# Patient Record
Sex: Female | Born: 1935 | Race: White | Hispanic: No | Smoking: Never smoker
Health system: Southern US, Community
[De-identification: ages and names within clinical notes are randomized; demographics above are authoritative.]

## PROBLEM LIST (undated history)

## (undated) DIAGNOSIS — R51 Headache: Secondary | ICD-10-CM

## (undated) DIAGNOSIS — R413 Other amnesia: Secondary | ICD-10-CM

## (undated) DIAGNOSIS — F0280 Dementia in other diseases classified elsewhere without behavioral disturbance: Secondary | ICD-10-CM

## (undated) DIAGNOSIS — H353220 Exudative age-related macular degeneration, left eye, stage unspecified: Secondary | ICD-10-CM

## (undated) DIAGNOSIS — W19XXXA Unspecified fall, initial encounter: Secondary | ICD-10-CM

## (undated) DIAGNOSIS — J302 Other seasonal allergic rhinitis: Secondary | ICD-10-CM

## (undated) DIAGNOSIS — S5290XA Unspecified fracture of unspecified forearm, initial encounter for closed fracture: Secondary | ICD-10-CM

## (undated) DIAGNOSIS — Z8489 Family history of other specified conditions: Secondary | ICD-10-CM

## (undated) DIAGNOSIS — K219 Gastro-esophageal reflux disease without esophagitis: Secondary | ICD-10-CM

## (undated) DIAGNOSIS — K449 Diaphragmatic hernia without obstruction or gangrene: Secondary | ICD-10-CM

## (undated) DIAGNOSIS — R519 Headache, unspecified: Secondary | ICD-10-CM

## (undated) DIAGNOSIS — G309 Alzheimer's disease, unspecified: Secondary | ICD-10-CM

## (undated) DIAGNOSIS — J189 Pneumonia, unspecified organism: Secondary | ICD-10-CM

## (undated) DIAGNOSIS — K5790 Diverticulosis of intestine, part unspecified, without perforation or abscess without bleeding: Secondary | ICD-10-CM

## (undated) HISTORY — PX: BLADDER SUSPENSION: SHX72

## (undated) HISTORY — PX: DILATION AND CURETTAGE OF UTERUS: SHX78

## (undated) HISTORY — PX: CATARACT EXTRACTION W/ INTRAOCULAR LENS IMPLANT: SHX1309

## (undated) HISTORY — PX: INGUINAL HERNIA REPAIR: SUR1180

---

## 2001-03-12 ENCOUNTER — Other Ambulatory Visit: Admission: RE | Admit: 2001-03-12 | Discharge: 2001-03-12 | Payer: Self-pay | Admitting: Internal Medicine

## 2001-04-19 ENCOUNTER — Encounter: Payer: Self-pay | Admitting: Internal Medicine

## 2001-04-19 ENCOUNTER — Ambulatory Visit (HOSPITAL_COMMUNITY): Admission: RE | Admit: 2001-04-19 | Discharge: 2001-04-19 | Payer: Self-pay | Admitting: Internal Medicine

## 2002-12-11 ENCOUNTER — Ambulatory Visit (HOSPITAL_COMMUNITY): Admission: RE | Admit: 2002-12-11 | Discharge: 2002-12-11 | Payer: Self-pay | Admitting: Internal Medicine

## 2002-12-11 ENCOUNTER — Encounter: Payer: Self-pay | Admitting: Internal Medicine

## 2003-01-10 ENCOUNTER — Encounter: Payer: Self-pay | Admitting: General Surgery

## 2003-01-10 ENCOUNTER — Ambulatory Visit (HOSPITAL_COMMUNITY): Admission: RE | Admit: 2003-01-10 | Discharge: 2003-01-10 | Payer: Self-pay | Admitting: General Surgery

## 2003-07-14 ENCOUNTER — Encounter: Payer: Self-pay | Admitting: Internal Medicine

## 2003-07-14 ENCOUNTER — Encounter: Admission: RE | Admit: 2003-07-14 | Discharge: 2003-07-14 | Payer: Self-pay | Admitting: Internal Medicine

## 2004-08-09 ENCOUNTER — Ambulatory Visit (HOSPITAL_COMMUNITY): Admission: RE | Admit: 2004-08-09 | Discharge: 2004-08-09 | Payer: Self-pay | Admitting: Internal Medicine

## 2005-09-27 ENCOUNTER — Ambulatory Visit (HOSPITAL_COMMUNITY): Admission: RE | Admit: 2005-09-27 | Discharge: 2005-09-27 | Payer: Self-pay | Admitting: Internal Medicine

## 2007-02-13 ENCOUNTER — Ambulatory Visit (HOSPITAL_COMMUNITY): Admission: RE | Admit: 2007-02-13 | Discharge: 2007-02-13 | Payer: Self-pay | Admitting: Internal Medicine

## 2008-03-04 ENCOUNTER — Ambulatory Visit (HOSPITAL_COMMUNITY): Admission: RE | Admit: 2008-03-04 | Discharge: 2008-03-04 | Payer: Self-pay | Admitting: Internal Medicine

## 2009-02-19 ENCOUNTER — Emergency Department (HOSPITAL_COMMUNITY): Admission: EM | Admit: 2009-02-19 | Discharge: 2009-02-19 | Payer: Self-pay | Admitting: Emergency Medicine

## 2009-02-23 ENCOUNTER — Emergency Department (HOSPITAL_COMMUNITY): Admission: EM | Admit: 2009-02-23 | Discharge: 2009-02-23 | Payer: Self-pay | Admitting: Emergency Medicine

## 2011-02-04 NOTE — Op Note (Signed)
NAME:  Tami Harris, Tami Harris                        ACCOUNT NO.:  [id number]   MEDICAL RECORD NO.:  [id number]                   PATIENT TYPE:  AMB   LOCATION:  DAY                                  FACILITY:  [facility]   PHYSICIAN:  Adolph Pollack, M.D.            DATE OF BIRTH:  02/08/1924   DATE OF PROCEDURE:  01/10/2003  DATE OF DISCHARGE:                                 OPERATIVE REPORT   PREOPERATIVE DIAGNOSIS:  Left inguinal hernia.   POSTOPERATIVE DIAGNOSIS:  Direct left inguinal hernia.   PROCEDURE:  Repair of direct left inguinal hernia with mesh.   SURGEON:  Adolph Pollack, M.D.   ANESTHESIA:  General plus 0.5% Marcaine for local block.   INDICATIONS FOR PROCEDURE:  Ms. Mohamud is a 77 year old female whose had  intermittent groin pains in the left side radiating down to her thigh and  she noticed a bulge that is reducible. On examination, indeed she has a left  inguinal bulge which is reducible and it is above the inguinal ligament  consistent with an inguinal hernia. She has had two previous bladder  suspensions through lower transverse incisions. She now presents for  elective hernia repair.   TECHNIQUE:  She is brought to the operating room, the left thigh was marked.  She was placed supine on the operating table and she was given a general  anesthetic. The groin area was shaved. The lower abdomen and groin were then  sterilely prepped and draped. 0.5% plain Marcaine was infiltrated in the  left groin both superficially and deep and an incision made in the left  groin and carried down through Scarpa's fascia to the external oblique  aponeurosis. The bleeding was controlled with cautery. More local anesthetic  was infiltrated deep to the external oblique aponeurosis and then it was  incised and split medially and then laterally exposing the underlying  internal oblique muscle and aponeurosis. Using blunt dissection, I exposed  this more and I noticed that there  was a suture likely from the previous  bladder suspension surgery and I cut this to allow for better exposure of  the internal oblique aponeurosis.   I identified the ilioinguinal nerve and injected some local anesthetic  around it. I then retracted it inferiorly. I then used blunt dissection to  identify the shelving edge of the inguinal ligament. I noted more scarring  and sutures from previous closure of bladder suspension operation and I went  ahead and incised this to allow for adequate exposure medially by the pubic  tubercle. I identified the round ligament, isolated it and excised part of  it. There was an obvious direct hernia defect with extraperitoneal fat  protruding from it. I loosely approximated the attenuating transversalis  fascia over the protruding fat.   I saw no evidence of an indirect defect. I subsequently brought a piece of 3  x 6 inch mesh into the field and anchored  it approximately 1-2 cm medial to  the pubic tubercle with 2-0 Prolene suture. I then anchored the inferior  aspect of the mesh to the shelving edge of the inguinal ligament with a  running 2-0 Prolene suture well lateral to the internal ring. The superior  aspect of the mesh was then anchored to the internal oblique muscle and  aponeurosis with interrupted 2-0 Vicryl sutures. The lateral aspect of the  mesh was then tucked deep to the external oblique aponeurosis.   Hemostasis was adequate at this time. The external oblique aponeurosis was  then closed over the mesh with a running 3-0 Vicryl suture. Scarpa's fascia  was closed with a running 3-0 Vicryl sutures. The skin was closed with a 4-0  Monocryl subcuticular stitch. Steri-Strips and a sterile dressing were  applied.   She tolerated the procedure well without any apparent complications and was  taken to the recovery room in satisfactory condition. The plan is to let her  go ahead and be discharged home in the care of her family.   DISCHARGE  INSTRUCTIONS:  A prescription for pain medicine had been given to  her.                                               Adolph Pollack, M.D.    Kari Baars  D:  01/10/2003  T:  01/10/2003  Job:  [id number]   cc:   Olene Craven, M.D.  [address]  [address]  [place]  [place] [address]  Fax: [phone]

## 2011-06-15 ENCOUNTER — Encounter (HOSPITAL_COMMUNITY): Payer: Self-pay

## 2011-06-15 ENCOUNTER — Emergency Department (HOSPITAL_COMMUNITY): Payer: Medicare Other

## 2011-06-15 ENCOUNTER — Inpatient Hospital Stay (HOSPITAL_COMMUNITY)
Admission: EM | Admit: 2011-06-15 | Discharge: 2011-06-17 | DRG: 690 | Disposition: A | Discharge status: Home Health | Payer: Medicare Other | Attending: Internal Medicine | Admitting: Internal Medicine

## 2011-06-15 ENCOUNTER — Observation Stay (HOSPITAL_COMMUNITY): Payer: Medicare Other

## 2011-06-15 DIAGNOSIS — K449 Diaphragmatic hernia without obstruction or gangrene: Secondary | ICD-10-CM | POA: Diagnosis present

## 2011-06-15 DIAGNOSIS — Z79899 Other long term (current) drug therapy: Secondary | ICD-10-CM

## 2011-06-15 DIAGNOSIS — G309 Alzheimer's disease, unspecified: Secondary | ICD-10-CM | POA: Diagnosis present

## 2011-06-15 DIAGNOSIS — F0280 Dementia in other diseases classified elsewhere without behavioral disturbance: Secondary | ICD-10-CM | POA: Diagnosis present

## 2011-06-15 DIAGNOSIS — N390 Urinary tract infection, site not specified: Principal | ICD-10-CM | POA: Diagnosis present

## 2011-06-15 DIAGNOSIS — E860 Dehydration: Secondary | ICD-10-CM | POA: Diagnosis present

## 2011-06-15 HISTORY — DX: Other amnesia: R41.3

## 2011-06-15 LAB — URINALYSIS, ROUTINE W REFLEX MICROSCOPIC
Glucose, UA: NEGATIVE mg/dL
Ketones, ur: NEGATIVE mg/dL
Protein, ur: NEGATIVE mg/dL

## 2011-06-15 LAB — URINE MICROSCOPIC-ADD ON

## 2011-06-15 LAB — CBC
MCH: 31.9 pg (ref 26.0–34.0)
Platelets: 210 10*3/uL (ref 150–400)
RBC: 4.29 MIL/uL (ref 3.87–5.11)
RDW: 13.7 % (ref 11.5–15.5)
WBC: 13.8 10*3/uL — ABNORMAL HIGH (ref 4.0–10.5)

## 2011-06-15 LAB — CK TOTAL AND CKMB (NOT AT ARMC)
CK, MB: 2.9 ng/mL (ref 0.3–4.0)
Total CK: 61 U/L (ref 7–177)

## 2011-06-15 LAB — BASIC METABOLIC PANEL
CO2: 26 mEq/L (ref 19–32)
Calcium: 9.7 mg/dL (ref 8.4–10.5)
Chloride: 105 mEq/L (ref 96–112)
GFR calc Af Amer: >60 mL/min (ref >60)
Sodium: 142 mEq/L (ref 135–145)

## 2011-06-15 LAB — DIFFERENTIAL
Basophils Relative: 0 % (ref 0–1)
Eosinophils Absolute: 0.2 10*3/uL (ref 0.0–0.7)
Neutrophils Relative %: 81 % — ABNORMAL HIGH (ref 43–77)

## 2011-06-15 LAB — MRSA PCR SCREENING: MRSA by PCR: NEGATIVE

## 2011-06-15 LAB — D-DIMER, QUANTITATIVE: D-Dimer, Quant: 2.58 ug/mL-FEU — ABNORMAL HIGH (ref 0.00–0.48)

## 2011-06-15 LAB — CARDIAC PANEL(CRET KIN+CKTOT+MB+TROPI)
CK, MB: 2.6 ng/mL (ref 0.3–4.0)
Total CK: 41 U/L (ref 7–177)
Troponin I: <0.3 ng/mL (ref <0.30)

## 2011-06-16 DIAGNOSIS — M79609 Pain in unspecified limb: Secondary | ICD-10-CM

## 2011-06-16 LAB — CARDIAC PANEL(CRET KIN+CKTOT+MB+TROPI)
CK, MB: 2.7 ng/mL (ref 0.3–4.0)
Relative Index: INVALID (ref 0.0–2.5)
Relative Index: INVALID (ref 0.0–2.5)
Relative Index: INVALID (ref 0.0–2.5)
Total CK: 41 U/L (ref 7–177)
Total CK: 43 U/L (ref 7–177)
Total CK: 47 U/L (ref 7–177)
Troponin I: <0.3 ng/mL (ref <0.30)
Troponin I: <0.3 ng/mL (ref <0.30)

## 2011-06-16 MED ORDER — IOHEXOL 300 MG/ML  SOLN
100.0000 mL | Freq: Once | INTRAMUSCULAR | Status: AC | PRN
  Administered 2011-06-16: 100 mL via INTRAVENOUS

## 2011-06-17 DIAGNOSIS — I369 Nonrheumatic tricuspid valve disorder, unspecified: Secondary | ICD-10-CM

## 2011-06-17 LAB — URINE CULTURE
Colony Count: >=100000
Culture  Setup Time: 201209261354

## 2011-06-17 LAB — CARDIAC PANEL(CRET KIN+CKTOT+MB+TROPI)
Relative Index: INVALID (ref 0.0–2.5)
Relative Index: INVALID (ref 0.0–2.5)
Total CK: 49 U/L (ref 7–177)
Troponin I: <0.3 ng/mL (ref <0.30)
Troponin I: <0.3 ng/mL (ref <0.30)

## 2011-06-27 NOTE — Discharge Summary (Signed)
Tami Harris, Tami Harris              ACCOUNT NO.:  [id number]  MEDICAL RECORD NO.:  [id number]  LOCATION:  4707                         FACILITY:  [facility]  PHYSICIAN:  Richarda Overlie, MD       DATE OF BIRTH:  23-Jan-1924  DATE OF ADMISSION:  06/15/2011 DATE OF DISCHARGE:  06/17/2011                              DISCHARGE SUMMARY   PRIMARY CARE PHYSICIAN:  Olene Craven, MD.  DISCHARGE DIAGNOSES: 1. Syncope likely secondary to dehydration, urinary tract infection. 2. Urinary tract infection. 3. Elevated D-dimer ruled out for deep venous thrombosis and pulmonary     embolism. 4. Dementia.  PROCEDURES: 1. CT angio of the chest without contrast shows no evidence of PE,     mild left lingular and left lower lobe atelectasis and scarring     with mild associated bronchiectasis and accessory azygos lobe     noted, large hiatal hernia noted. 2. CT of the head without contrast shows no evidence of acute     intracranial abnormality, atrophy with mild small vessel ischemic     changes. 3. Chest x-ray shows a large hiatal hernia.  No active disease.  DISCHARGE MEDICATIONS: 1. Ciprofloxacin 500 mg p.o. twice daily for 5 days. 2. Temazepam 15 mg p.o. daily at bedtime. 3. Calcium carbonate 1 tablet p.o. 3 times a day. 4. Aricept 10 mg p.o. daily. 5. Fish oil 1 g 2 capsules p.o. daily. 6. Loratadine 10 mg p.o. daily as needed. 7. Lutein 20 mg p.o. daily. 8. Namenda 10 mg p.o. twice daily. 9. Vitamin A 1 tablet p.o. daily. 10.Vitamin D3 2000 one capsule p.o. daily.  SUBJECTIVE:  This is an 77 year old female resident of [place] who presented to the ER after an episode of syncope with mild shaking and drooling and she was noted to be diaphoretic and incontinent of urine.  The patient upon arrival was completely asymptomatic, alert, but somewhat confused and had no recollection of the event.  She was found to be hemodynamically stable upon presentation.  Initial blood  work showed her troponin to be negative.  Her EKG was consistent with a normal sinus rhythm without any acute ST-T segment changes.  A D-dimer was elevated at 2.58.  rest of her lab work including a WBC count showed an elevated WBC count of 13.8.  The patient was admitted for further evaluation.  HOSPITAL COURSE: 1. Syncope.  The patient had cardiac enzymes cycled which were found     to be negative.  She was placed on telemetry which was also found     to be negative and the patient was in normal sinus rhythm.  Her D-     dimer was elevated and the patient had a CT angio of the chest and     Doppler of bilateral lower extremity that did not show any evidence     of DVT or PE.  To complete the work the patient had a 2-D echo.  2-     D echo that showed an EF of 55-60% and grade 1 diastolic     dysfunction.  The patient also has Doppler ultrasound ordered,     which is pending  at this time.  The patient did not have any gross     focal neurologic deficits to suggest a CVA or a TIA.  She was     evaluated by PT, OT and was deemed to be appropriate for home     health services and home health R.N. will be arranged for. 2. Urinary tract infection.  The patient was started on treatment with     ciprofloxacin which she will continue for another 5 days.  Physical examination prior to discharge, the patient had orthostatics done which were found to be negative.  Her  blood pressure was 124/69, respirations 20, pulse 65, temperature 98.5, and 95% on room air.  At this time, the patient will be discharged to [facility] with home health RN services.  She will follow up with PCP in 5-7 days.     Richarda Overlie, MD     NA/MEDQ  D:  06/17/2011  T:  06/17/2011  Job:  [id number]  Electronically Signed by Richarda Overlie MD on 06/27/2011 02:21:06 PM

## 2011-07-17 NOTE — H&P (Signed)
Tami Harris, Tami Harris NO.:  [id number]  MEDICAL RECORD NO.:  [id number]  LOCATION:  4707                         FACILITY:  [facility]  PHYSICIAN:  Jonny Ruiz, MD    DATE OF BIRTH:  1924/05/13  DATE OF ADMISSION:  06/15/2011 DATE OF DISCHARGE:                             HISTORY & PHYSICAL   CHIEF COMPLAINT:  Passed out.  HISTORY OF PRESENT ILLNESS:  [name] with no significant past medical history, who resides at [facility] While she was having her hair done, she was sitting and slumped over. She was seen with mild shaking tremors, drooling, and diaphoretic.  It was also noted that she was incontinent of urine.  Subsequently, EMS brought the patient to the emergency department for further evaluation. Upon arrival, the patient was asymptomatic, alert but disoriented, was not able to make recollection of events.  No headaches or drowsiness. Denied any pain, shortness of breath, palpitations.  Denied headaches. No focal neurological deficits.  Prior to this event, the patient was doing well and had no complaints whatsoever.  PAST MEDICAL HISTORY:  Alzheimer's dementia.  MEDICATIONS: 1. Ambien 5 mg at bedtime. 2. Aricept 10 mg a day. 3. Claritin D 12-hour. 4. Namenda oral 10 mg b.i.d.  ALLERGIES:  SULFA.  SOCIAL HISTORY:  No tobacco, alcohol, or illicit.  Again resides at [facility].  REVIEW OF SYSTEMS:  Unable to obtain from the patient.  PHYSICAL EXAMINATION:  VITAL SIGNS:  Blood pressure 143/73, pulse 62, respirations 18, temperature 98.4, O2 sat 96% on room air. GENERAL APPEARANCE:  [name], who appears comfortable.  She is alert but disoriented.  She is pleasant and cooperative. HEENT:  Head is atraumatic.  PERRLA, EOMI.  Nose, clear nostrils.  Oral cavity, dry oral mucosa.  Tongue midline.  No nasolabial droops. NECK:  Supple.  No lymphadenopathy.  No carotid  bruits. HEART:  Regular S1 and S2 without gallops, murmurs, or rubs. LUNGS:  Clear to auscultation bilaterally.  No rales, rhonchi, or wheezes. ABDOMEN:  Nondistended, soft, nontender with normal bowel sounds.  No organomegaly or masses palpable. EXTREMITIES:  No clubbing, cyanosis, or edema. NEUROLOGIC:  Cranial nerves II through XII intact.  Motor strength is intact in the upper and lower extremities.  Sensation is normal.  Finger- to-nose difficult, but able to perform.  Heel-to-shin normal.  No Babinski.  No pronator drift. SKIN:  Normal.  No rashes.  No petechia.  Warm and dry.  LABORATORY DATA:  Sodium 142, potassium 3.6, chloride 105, carbon dioxide 26, glucose 108, BUN 15, creatinine 0.76, calcium 9.7.  WBC is 13.8, hemoglobin 13.7, hematocrit 40.3, MCV 93.9, platelet count 210, neutrophils 81%, granulocyte absolute 11.3, lymphocyte 8.  Total CK 61, CK-MB 2.9.  Troponin negative.  Head CT, no evidence of acute intracranial abnormality.  Atrophy with mild small vessel ischemic changes.  Chest x-ray, large hiatal hernia.  No active disease.  UA, nitrite positive, leukocyte esterase moderate.  Microscopy, bacteria many, wbc's 7-10.  EKG, normal sinus rhythm with no ST or T-wave abnormalities, rate 56 bpm.  D-dimer 2.58.  IMPRESSION: 1. Syncope. 2. Positive D-dimer. 3. Urinary tract  infection. 4. Large hiatal hernia. 5. Alzheimer's dementia.  PLAN: 1. Admit the patient to telemetry.  Obtain CT PA or CT angiogram of     the pulmonary arteries to rule out PE. 2. Obtain urine culture and sensitivity.  Start the patient on Cipro     400 IV q.12, omeprazole 40 mg a day.  Further orders pending     results of CT PA.          ______________________________ Jonny Ruiz, MD     GL/MEDQ  D:  06/15/2011  T:  06/15/2011  Job:  [id number]  Electronically Signed by Jonny Ruiz MD on 07/17/2011 09:31:49 PM

## 2011-09-20 DIAGNOSIS — J189 Pneumonia, unspecified organism: Secondary | ICD-10-CM

## 2011-09-20 HISTORY — DX: Pneumonia, unspecified organism: J18.9

## 2011-10-18 ENCOUNTER — Observation Stay (HOSPITAL_COMMUNITY)
Admission: EM | Admit: 2011-10-18 | Discharge: 2011-10-19 | Payer: Medicare Other | Attending: Internal Medicine | Admitting: Internal Medicine

## 2011-10-18 ENCOUNTER — Encounter (HOSPITAL_COMMUNITY): Payer: Self-pay | Admitting: Emergency Medicine

## 2011-10-18 ENCOUNTER — Emergency Department (HOSPITAL_COMMUNITY): Payer: Medicare Other

## 2011-10-18 DIAGNOSIS — G4700 Insomnia, unspecified: Secondary | ICD-10-CM | POA: Insufficient documentation

## 2011-10-18 DIAGNOSIS — R5381 Other malaise: Secondary | ICD-10-CM | POA: Insufficient documentation

## 2011-10-18 DIAGNOSIS — Z79899 Other long term (current) drug therapy: Secondary | ICD-10-CM | POA: Insufficient documentation

## 2011-10-18 DIAGNOSIS — R509 Fever, unspecified: Secondary | ICD-10-CM | POA: Insufficient documentation

## 2011-10-18 DIAGNOSIS — F0390 Unspecified dementia without behavioral disturbance: Secondary | ICD-10-CM | POA: Insufficient documentation

## 2011-10-18 DIAGNOSIS — J189 Pneumonia, unspecified organism: Principal | ICD-10-CM | POA: Diagnosis present

## 2011-10-18 DIAGNOSIS — D72829 Elevated white blood cell count, unspecified: Secondary | ICD-10-CM | POA: Insufficient documentation

## 2011-10-18 HISTORY — DX: Diverticulosis of intestine, part unspecified, without perforation or abscess without bleeding: K57.90

## 2011-10-18 LAB — DIFFERENTIAL
Lymphs Abs: 2 10*3/uL (ref 0.7–4.0)
Monocytes Relative: 11 % (ref 3–12)
Neutro Abs: 10.3 10*3/uL — ABNORMAL HIGH (ref 1.7–7.7)
Neutrophils Relative %: 74 % (ref 43–77)

## 2011-10-18 LAB — BASIC METABOLIC PANEL
BUN: 16 mg/dL (ref 6–23)
CO2: 27 mEq/L (ref 19–32)
Chloride: 105 mEq/L (ref 96–112)
Glucose, Bld: 96 mg/dL (ref 70–99)
Potassium: 3.9 mEq/L (ref 3.5–5.1)

## 2011-10-18 LAB — CBC
Hemoglobin: 13.4 g/dL (ref 12.0–15.0)
MCH: 30.6 pg (ref 26.0–34.0)
RBC: 4.38 MIL/uL (ref 3.87–5.11)

## 2011-10-18 MED ORDER — SPIRITUS FRUMENTI
1.0000 | Freq: Three times a day (TID) | ORAL | Status: DC
  Filled 2011-10-18: qty 1

## 2011-10-18 MED ORDER — ZOLPIDEM TARTRATE 5 MG PO TABS: 5.0000 mg | ORAL_TABLET | Freq: Every evening | ORAL | Status: DC | PRN

## 2011-10-18 MED ORDER — SODIUM CHLORIDE 0.9 % IJ SOLN
3.0000 mL | Freq: Two times a day (BID) | INTRAMUSCULAR | Status: DC
  Administered 2011-10-18 – 2011-10-19 (×2): 3 mL via INTRAVENOUS

## 2011-10-18 MED ORDER — LORATADINE 10 MG PO TABS
10.0000 mg | ORAL_TABLET | Freq: Every day | ORAL | Status: DC | PRN
  Filled 2011-10-18: qty 1

## 2011-10-18 MED ORDER — LEVOFLOXACIN IN D5W 250 MG/50ML IV SOLN
250.0000 mg | INTRAVENOUS | Status: DC
  Filled 2011-10-18: qty 50

## 2011-10-18 MED ORDER — LEVOFLOXACIN IN D5W 500 MG/100ML IV SOLN
500.0000 mg | INTRAVENOUS | Status: DC
  Filled 2011-10-18: qty 100

## 2011-10-18 MED ORDER — DEXTROSE 5 % IV SOLN
1.0000 g | Freq: Once | INTRAVENOUS | Status: AC
  Administered 2011-10-18: 1 g via INTRAVENOUS
  Filled 2011-10-18: qty 10

## 2011-10-18 MED ORDER — MEMANTINE HCL 10 MG PO TABS
10.0000 mg | ORAL_TABLET | Freq: Two times a day (BID) | ORAL | Status: DC
  Administered 2011-10-18 – 2011-10-19 (×2): 10 mg via ORAL
  Filled 2011-10-18 (×5): qty 1

## 2011-10-18 MED ORDER — LEVOFLOXACIN IN D5W 500 MG/100ML IV SOLN
500.0000 mg | Freq: Once | INTRAVENOUS | Status: AC
  Administered 2011-10-18: 500 mg via INTRAVENOUS
  Filled 2011-10-18: qty 100

## 2011-10-18 MED ORDER — TEMAZEPAM 15 MG PO CAPS: 15.0000 mg | ORAL_CAPSULE | Freq: Every evening | ORAL | Status: DC | PRN

## 2011-10-18 MED ORDER — SODIUM CHLORIDE 0.9 % IV SOLN: 250.0000 mL | INTRAVENOUS | Status: DC | PRN

## 2011-10-18 MED ORDER — DONEPEZIL HCL 10 MG PO TABS
10.0000 mg | ORAL_TABLET | Freq: Every day | ORAL | Status: DC
  Administered 2011-10-18: 10 mg via ORAL
  Filled 2011-10-18 (×3): qty 1

## 2011-10-18 MED ORDER — DEXTROSE 5 % IV SOLN
500.0000 mg | Freq: Once | INTRAVENOUS | Status: AC
  Administered 2011-10-18: 500 mg via INTRAVENOUS
  Filled 2011-10-18 (×2): qty 500

## 2011-10-18 MED ORDER — SPIRITUS FRUMENTI: 1.0000 | Freq: Three times a day (TID) | ORAL | Status: DC

## 2011-10-18 MED ORDER — SODIUM CHLORIDE 0.9 % IJ SOLN: 3.0000 mL | INTRAMUSCULAR | Status: DC | PRN

## 2011-10-18 NOTE — ED Provider Notes (Addendum)
History     CSN: [id number]  Arrival date & time 10/18/11  1218   First MD Initiated Contact with Patient 10/18/11 1252      Chief Complaint  Patient presents with  . Fever    99.2 at nursing home    (Consider location/radiation/quality/duration/timing/severity/associated sxs/prior treatment) The history is provided by a relative and the nursing home. The history is limited by the condition of the patient.  pt sent from NH because of cough and change behavior.  Level 5 for dementia.  Past Medical History  Diagnosis Date  . Allergic rhinitis   . Memory loss   . Dementia     Past Surgical History  Procedure Date  . Inguinal hernia repair     No family history on file.  History  Substance Use Topics  . Smoking status: Not on file  . Smokeless tobacco: Not on file  . Alcohol Use:     OB History    Grav Para Term Preterm Abortions TAB SAB Ect Mult Living                  Review of Systems  Unable to perform ROS level 5 for dementia  Allergies  Sulfa antibiotics  Home Medications   Current Outpatient Rx  Name Route Sig Dispense Refill  . CALCIUM CARBONATE ANTACID 500 MG PO CHEW Oral Chew 1 tablet by mouth daily.    Marland Kitchen VITAMIN D 1000 UNITS PO TABS Oral Take 1,000 Units by mouth daily.    . DONEPEZIL HCL 10 MG PO TABS Oral Take 10 mg by mouth at bedtime as needed.    Marland Kitchen LORATADINE 10 MG PO TABS Oral Take 10 mg by mouth daily as needed. ALLERGIES    . MEMANTINE HCL 10 MG PO TABS Oral Take 10 mg by mouth 2 (two) times daily.    . ADULT MULTIVITAMIN W/MINERALS CH Oral Take 1 tablet by mouth daily.    . OCUVITE-LUTEIN PO CAPS Oral Take 1 capsule by mouth daily.    . OMEGA-3-ACID ETHYL ESTERS 1 G PO CAPS Oral Take 2 g by mouth 2 (two) times daily.    Marland Kitchen TEMAZEPAM 15 MG PO CAPS Oral Take 15 mg by mouth at bedtime as needed.      BP 152/70  Pulse 76  Temp(Src) 99.8 F (37.7 C) (Oral)  Resp 16  SpO2 95%  Physical Exam  Vitals reviewed. Constitutional: She  appears well-developed and well-nourished.  HENT:  Head: Normocephalic and atraumatic.  Eyes: Pupils are equal, round, and reactive to light.  Neck: Normal range of motion. Neck supple.  Cardiovascular: Normal rate.   No murmur heard. Pulmonary/Chest: Effort normal. She has wheezes.       + rhonchi  Abdominal: Soft. There is no tenderness.  Neurological: She is alert.  Skin: Skin is warm and dry.    ED Course  Procedures (including critical care time)  Labs Reviewed  CBC - Abnormal; Notable for the following:    WBC 14.0 (*)    All other components within normal limits  DIFFERENTIAL - Abnormal; Notable for the following:    Neutro Abs 10.3 (*)    Monocytes Absolute 1.5 (*)    All other components within normal limits  BASIC METABOLIC PANEL - Abnormal; Notable for the following:    GFR calc non Af Amer 64 (*)    GFR calc Af Amer 75 (*)    All other components within normal limits  URINALYSIS, ROUTINE W REFLEX  MICROSCOPIC   Dg Chest Port 1 View  10/18/2011  *RADIOLOGY REPORT*  Clinical Data: Bedridden.  Nausea.  Vomiting, confused.  PORTABLE CHEST - 1 VIEW  Comparison: 06/16/2011 chest CT.  Findings: Moderate sized hiatal hernia is present.  Azygos fissure incidentally noted.  There is a faint area of density in the right upper lobe suspicious for pneumonia.  Lung volumes are lower than on prior exam.  Cardiopericardial silhouette and mediastinal contours are unchanged.  Follow-up to ensure radiographic clearing recommended.  Clearing is usually observed at 4-6 weeks.  IMPRESSION: 1. Faint patchy opacity in the right upper lobe is suspicious for pneumonia. 2.  Low lung volumes. 3.  Moderate sized hiatal hernia.  Original Report Authenticated By: Andreas Newport, M.D.     No diagnosis found.  2:58 PM Spoke with [facility] md. He will admit for tx of cap  CAP  MDM  CAP Leukocytosis dementia        Nicholes Stairs, MD 10/18/11 1458  Nicholes Stairs, MD 11/11/11  [phone]

## 2011-10-18 NOTE — ED Notes (Signed)
Pt is from [facility]. Has dementia.  Denies pain.

## 2011-10-18 NOTE — H&P (Signed)
History and Physical  LATISSA FRICK ZOX:[id number] DOB: January 04, 1924 DOA: 10/18/2011  Referring physician: Shela Commons. Nino Parsley, MD PCP: Ginette Otto, MD, MD   Chief Complaint: Lethargy  HPI:  78 year old woman presented to the emergency department from the nursing home. The patient has dementia and is not a reliable historian. History is obtained from her daughter is at the bedside. She reports that her mother has had a cough for a few days. Today her mother would not get out of bed which is quite unusual and she was noted by her roommate to be more lethargic. She seemed to be less responsive and perhaps more confused and therefore was sent to the emergency department for further evaluation. The patient herself has no complaints. Her daughter notes that her mother is sleepier than usual.  Workup in the emergency department was notable for leukocytosis and right upper lobe pneumonia. Because of her advanced age observation was recommended in the hospitalist service was consulted.  Review of Systems:  Negative for fever, changes to vision, sore throat, rash, muscle aches, chest pain, shortness of breath, dysuria, bleeding, nausea, vomiting, abdominal pain, diarrhea.  Past Medical History  Diagnosis Date  . Allergic rhinitis   . Memory loss   . Dementia    Past Surgical History  Procedure Date  . Inguinal hernia repair    Social History:  does not have a smoking history on file. She does not have any smokeless tobacco history on file. Her alcohol and drug histories not on file.  Allergies  Allergen Reactions  . Sulfa Antibiotics Other (See Comments)    unknown   Family History  Problem Relation Age of Onset  . Dementia Mother   . Parkinsonism Brother    Prior to Admission medications   Medication Sig Start Date End Date Taking? Authorizing Provider  calcium carbonate (TUMS - DOSED IN MG ELEMENTAL CALCIUM) 500 MG chewable tablet Chew 1 tablet by mouth daily.   Yes Historical  Provider, MD  cholecalciferol (VITAMIN D) 1000 UNITS tablet Take 1,000 Units by mouth daily.   Yes Historical Provider, MD  donepezil (ARICEPT) 10 MG tablet Take 10 mg by mouth at bedtime as needed.   Yes Historical Provider, MD  loratadine (CLARITIN) 10 MG tablet Take 10 mg by mouth daily as needed. ALLERGIES   Yes Historical Provider, MD  memantine (NAMENDA) 10 MG tablet Take 10 mg by mouth 2 (two) times daily.   Yes Historical Provider, MD  Multiple Vitamin (MULITIVITAMIN WITH MINERALS) TABS Take 1 tablet by mouth daily.   Yes Historical Provider, MD  multivitamin-lutein (OCUVITE-LUTEIN) CAPS Take 1 capsule by mouth daily.   Yes Historical Provider, MD  omega-3 acid ethyl esters (LOVAZA) 1 G capsule Take 2 g by mouth 2 (two) times daily.   Yes Historical Provider, MD  temazepam (RESTORIL) 15 MG capsule Take 15 mg by mouth at bedtime as needed.   Yes Historical Provider, MD   Physical Exam: Filed Vitals:   10/18/11 1222 10/18/11 1230  BP: 147/65 152/70  Pulse: 69 76  Temp: 99.1 F (37.3 C) 99.8 F (37.7 C)  TempSrc: Oral Oral  Resp: 16   SpO2: 95% 95%     General:  Appears calm and comfortable. Sleeping but easily arouses to voice.  Eyes: Pupils equal, round, reactive to light. Normal lids, irises, conjunctiva.  ENT: Hard of hearing. Lips and tongue appear unremarkable.  Neck: No lymphadenopathy or masses. No thyromegaly.  Cardiovascular: Regular rate and rhythm. No murmur, rub, gallop. No  lower extremity edema.  Respiratory: Coarse breath sounds with some rhonchi and wheezes noted. Normal respiratory effort.  Abdomen: Soft, nontender, nondistended.  Skin: Grossly unremarkable. No rash, induration, lesions seen.  Musculoskeletal: Appears grossly unremarkable.  Psychiatric: Alert, calm, pleasant.  Neurologic: Grossly unremarkable.  Labs on Admission:  Basic Metabolic Panel:  Lab 10/18/11 0960  NA 142  K 3.9  CL 105  CO2 27  GLUCOSE 96  BUN 16  CREATININE 0.81    CALCIUM 9.6  MG --  PHOS --   CBC:  Lab 10/18/11 1235  WBC 14.0*  NEUTROABS 10.3*  HGB 13.4  HCT 39.5  MCV 90.2  PLT 237   Radiological Exams on Admission: Dg Chest Port 1 View  10/18/2011  *RADIOLOGY REPORT*  Clinical Data: Bedridden.  Nausea.  Vomiting, confused.  PORTABLE CHEST - 1 VIEW  Comparison: 06/16/2011 chest CT.  Findings: Moderate sized hiatal hernia is present.  Azygos fissure incidentally noted.  There is a faint area of density in the right upper lobe suspicious for pneumonia.  Lung volumes are lower than on prior exam.  Cardiopericardial silhouette and mediastinal contours are unchanged.  Follow-up to ensure radiographic clearing recommended.  Clearing is usually observed at 4-6 weeks.  IMPRESSION: 1. Faint patchy opacity in the right upper lobe is suspicious for pneumonia. 2.  Low lung volumes. 3.  Moderate sized hiatal hernia.  Original Report Authenticated By: Andreas Newport, M.D.    Assessment/Plan 1. Right upper lobe pneumonia: Admitted medical floor. IV antibiotics. She is neither hypoxic nor tachypneic. She is afebrile and vital signs are stable. Therefore will treat as a community-acquired pneumonia even though she is from assisted living facility, as I doubt multidrug resistant organisms or MRSA. 2. Dementia: Appears stable. Continue Aricept and Namenda.  I discussed my clinical impression and plan with the patient's daughter at the bedside. She is agreeable to antibiotics but wants no aggressive measures and no further testing if possible. I do not see a compelling reason to repeat blood work in the morning given these wishes. Daughter is hopeful for discharge back to assisted living facility January 30.  Code Status: DO NOT RESUSCITATE Family Communication: Healthcare power of attorney/daughter Cipriano Mile [phone]. Disposition Plan: Return to assisted living facility January 30 anticipated.  Brendia Sacks, MD  [facility] Pager  [phone] 10/18/2011, 2:47 PM

## 2011-10-18 NOTE — Progress Notes (Signed)
ANTIBIOTIC CONSULT NOTE - INITIAL  Pharmacy Consult for Levaquin Indication: Community Acquired Pneumonia  Allergies  Allergen Reactions  . Sulfa Antibiotics Other (See Comments)    unknown    Patient Measurements: Height: 5\' 6"  (167.6 cm) Weight: 151 lb 9.6 oz (68.765 kg) IBW/kg (Calculated) : 59.3     Vital Signs: Temp: 98.1 F (36.7 C) (01/29 1700) Temp src: Oral (01/29 1700) BP: 157/73 mmHg (01/29 1700) Pulse Rate: 67  (01/29 1612) Intake/Output from previous day:   Intake/Output from this shift:    Labs:  Basename 10/18/11 1235  WBC 14.0*  HGB 13.4  PLT 237  LABCREA --  CREATININE 0.81   Estimated Creatinine Clearance: 47.5 ml/min (by C-G formula based on Cr of 0.81). No results found for this basename: VANCOTROUGH:2,VANCOPEAK:2,VANCORANDOM:2,GENTTROUGH:2,GENTPEAK:2,GENTRANDOM:2,TOBRATROUGH:2,TOBRAPEAK:2,TOBRARND:2,AMIKACINPEAK:2,AMIKACINTROU:2,AMIKACIN:2, in the last 72 hours   Microbiology: No results found for this or any previous visit (from the past 720 hour(s)).  Medical History: Past Medical History  Diagnosis Date  . Allergic rhinitis   . Memory loss   . Dementia   . Diverticulosis     Medications:  Scheduled:    . azithromycin  500 mg Intravenous Once  . cefTRIAXone (ROCEPHIN)  IV  1 g Intravenous Once  . donepezil  10 mg Oral QHS  . levofloxacin (LEVAQUIN) IV  500 mg Intravenous Q24H  . memantine  10 mg Oral BID  . sodium chloride  3 mL Intravenous Q12H  . DISCONTD: spiritus frumenti  1 each Oral TID WC  . DISCONTD: spiritus frumenti  1 each Oral TID WC   Infusions:   Assessment: Pharmacy asked to adjust antibiotics for renal function.  78 year old female with Scr = 0.81; however CrCl = 47 ml/min.  IV Levaquin to begin for right upper lobe pneumonia.  Pt is a resident of a nursing home; however provider notes patient is afebrile and vitals are stable and so will treat this PNA as community acquired.  Plan:  1.  Levaquin 500mg  IV x 1  then Levaquin 250mg  IV q24h 2.  Follow renal function and adjust dose if renal function should either improve or worsen.  Sherwood Castilla, Joselyn Glassman 10/18/2011,5:58 PM

## 2011-10-18 NOTE — ED Notes (Signed)
Pt's daughter Cipriano Mile: [phone]

## 2011-10-18 NOTE — ED Notes (Signed)
WUJ:[id number]<JY> Expected date:10/18/11<BR> Expected time:12:10 PM<BR> Means of arrival:Ambulance<BR> Comments:<BR> EMS 110 GC, 87 yof fever and FTT

## 2011-10-19 MED ORDER — LEVOFLOXACIN 750 MG PO TABS: 750.0000 mg | ORAL_TABLET | Freq: Every day | ORAL | Status: AC

## 2011-10-19 MED ORDER — DONEPEZIL HCL 10 MG PO TABS: 10.0000 mg | ORAL_TABLET | Freq: Every day | ORAL | Status: DC

## 2011-10-19 MED ORDER — GUAIFENESIN ER 600 MG PO TB12: 600.0000 mg | ORAL_TABLET | Freq: Two times a day (BID) | ORAL | Status: AC

## 2011-10-19 MED ORDER — LEVOFLOXACIN IN D5W 750 MG/150ML IV SOLN: 750.0000 mg | INTRAVENOUS | Status: DC

## 2011-10-19 NOTE — Progress Notes (Signed)
Patient discharged to ALF, all discharge medications and instructions reviewed with Daughter/POA Annice Pih and questions answered. Patient to be transported to ALF by daughter.

## 2011-10-19 NOTE — Progress Notes (Signed)
Patient cleared for discharge. Patient cleared to return to [facility]. CSW spoke with Anette Riedel at [facility]. CSW faxed completed FL-2 and discharge summary to [facility]. Packet copied and placed in [place]. CSW spoke with patients daughter, she is agreeable to transfer and she will transport the patient by care. Psychosocial assessment completed and placed in shadow chart.  Saveah Bahar C. Dealva Lafoy MSW, LCSW [phone]

## 2011-10-19 NOTE — Discharge Summary (Signed)
Physician Discharge Summary  Patient ID: HANNE KEGG MRN: [id number] DOB/AGE: Jan 18, 1924 78 y.o.  Admit date: November 12, 2011 Discharge date: 10/19/2011  Primary Care Physician:  Ginette Otto, MD, MD   Discharge Diagnoses:   1-Pneumonia 2-Leukocytosis 3-Fever 4-Dementia 5-Insomnia   Medication List  As of 10/19/2011  2:55 PM   TAKE these medications         calcium carbonate 500 MG chewable tablet   Commonly known as: TUMS - dosed in mg elemental calcium   Chew 1 tablet by mouth daily.      cholecalciferol 1000 UNITS tablet   Commonly known as: VITAMIN D   Take 1,000 Units by mouth daily.      donepezil 10 MG tablet   Commonly known as: ARICEPT   Take 10 mg by mouth at bedtime as needed.      guaiFENesin 600 MG 12 hr tablet   Commonly known as: MUCINEX   Take 1 tablet (600 mg total) by mouth 2 (two) times daily.      levofloxacin 750 MG tablet   Commonly known as: LEVAQUIN   Take 1 tablet (750 mg total) by mouth daily.      loratadine 10 MG tablet   Commonly known as: CLARITIN   Take 10 mg by mouth daily as needed. ALLERGIES      memantine 10 MG tablet   Commonly known as: NAMENDA   Take 10 mg by mouth 2 (two) times daily.      mulitivitamin with minerals Tabs   Take 1 tablet by mouth daily.      multivitamin-lutein Caps   Take 1 capsule by mouth daily.      omega-3 acid ethyl esters 1 G capsule   Commonly known as: LOVAZA   Take 2 g by mouth 2 (two) times daily.      temazepam 15 MG capsule   Commonly known as: RESTORIL   Take 15 mg by mouth at bedtime as needed.             Disposition and Follow-up:  Discharge in stable and improved condition back to ALF. Patient will finish Levaquin 750 mg for 7 more days to finish antibiotics treatment and will follow the rest of discharge instructions and also prescribed medications. Patient will follow with PCP in 7-10 days for follow up.  Consults:   None   Significant Diagnostic Studies:  Dg  Chest Port 1 View  2011/11/12  *RADIOLOGY REPORT*  Clinical Data: Bedridden.  Nausea.  Vomiting, confused.  PORTABLE CHEST - 1 VIEW  Comparison: 06/16/2011 chest CT.  Findings: Moderate sized hiatal hernia is present.  Azygos fissure incidentally noted.  There is a faint area of density in the right upper lobe suspicious for pneumonia.  Lung volumes are lower than on prior exam.  Cardiopericardial silhouette and mediastinal contours are unchanged.  Follow-up to ensure radiographic clearing recommended.  Clearing is usually observed at 4-6 weeks.  IMPRESSION: 1. Faint patchy opacity in the right upper lobe is suspicious for pneumonia. 2.  Low lung volumes. 3.  Moderate sized hiatal hernia.  Original Report Authenticated By: Andreas Newport, M.D.    Brief H and P: 78 y/o female with PMH significant for dementia; came into the hospital due to cough, fever and worsening confusion. Found on CXR to have Pneumonia; she was also found with leukocytosis and increase lethargy.   Hospital Course:  1-Pneumonia:Afebrile and with good vital signs now. Will d/c on Levaquin PO daily for 7 more days  and also mucinex to control her cough. Patient instructed to drink plenty of fluids and keep herself hydrated. She will return to ALF and will follow with PCP in 7-10 days.  2-Leukocytosis: 2/2 #1; continue antibiotics.  3-Fever: Also due to #1; continue antibiotics and tylenol as needed for symptoms control.  4-Dementia: Continue supportive care, namenda and aricept.  5-Insomnia:Continue PRN zolpidem.   Time spent on Discharge: 40 minutes.  Signed: Arieonna Medine 10/19/2011, 2:55 PM

## 2011-10-19 NOTE — Progress Notes (Signed)
ANTIBIOTIC CONSULT NOTE - FOLLOW UP  Pharmacy Consult for Levaquin Indication: HCAP  Allergies  Allergen Reactions  . Sulfa Antibiotics Other (See Comments)    unknown    Patient Measurements: Height: 5\' 6"  (167.6 cm) Weight: 151 lb 9.6 oz (68.765 kg) IBW/kg (Calculated) : 59.3    Vital Signs: Temp: 98.3 F (36.8 C) (01/30 1610) Temp src: Oral (01/30 9604) BP: 133/73 mmHg (01/30 5409) Pulse Rate: 61  (01/30 0632) Intake/Output from previous day:   Intake/Output from this shift: Total I/O In: 243 [P.O.:240; I.V.:3] Out: -   Labs:  Basename 10/18/11 1235  WBC 14.0*  HGB 13.4  PLT 237  LABCREA --  CREATININE 0.81   Estimated Creatinine Clearance: 47.5 ml/min (by C-G formula based on Cr of 0.81).    Microbiology: Sputum cx ordered, not collected yet  Anti-infectives     Start     Dose/Rate Route Frequency Ordered Stop   10/20/11 1600   Levofloxacin (LEVAQUIN) IVPB 750 mg        750 mg 100 mL/hr over 90 Minutes Intravenous Every 48 hours 10/19/11 1029     10/19/11 1800   Levofloxacin (LEVAQUIN) IVPB 250 mg  Status:  Discontinued        250 mg 50 mL/hr over 60 Minutes Intravenous Every 24 hours 10/18/11 1824 10/19/11 1029   10/18/11 1830   levofloxacin (LEVAQUIN) IVPB 500 mg  Status:  Discontinued        500 mg 100 mL/hr over 60 Minutes Intravenous Every 24 hours 10/18/11 1752 10/18/11 1806   10/18/11 1830   levofloxacin (LEVAQUIN) IVPB 500 mg        500 mg 100 mL/hr over 60 Minutes Intravenous  Once 10/18/11 1807 10/18/11 2117   10/18/11 1800   Levofloxacin (LEVAQUIN) IVPB 250 mg  Status:  Discontinued        250 mg 50 mL/hr over 60 Minutes Intravenous Every 24 hours 10/18/11 1807 10/18/11 1824   10/18/11 1430   cefTRIAXone (ROCEPHIN) 1 g in dextrose 5 % 50 mL IVPB        1 g 100 mL/hr over 30 Minutes Intravenous  Once 10/18/11 1426 10/18/11 1502   10/18/11 1430   azithromycin (ZITHROMAX) 500 mg in dextrose 5 % 250 mL IVPB        500 mg 250 mL/hr  over 60 Minutes Intravenous  Once 10/18/11 1426 10/18/11 1602          Assessment: 87 YOF, SNF resident w/ HCAP. Day #2 Levaquin 250 mg IV q24h after 1 x 500mg  1/29. Dose was based on CAP, will adjust to HCAP dosing since from SNF, increased WBC and reported Tm 99.2 @ SNF and Tm 99.8 here.  Goal of Therapy:  Appropriate dose of Levaquin  Plan:  Increase dose to 750mg  IV q48h. Will schedule next dose for 1/31 AM. Follow Scr/cx. Adjust dose as appropriate.  Gwen Her PharmD  [phone] 10/19/2011 10:34 AM

## 2014-12-15 ENCOUNTER — Ambulatory Visit (INDEPENDENT_AMBULATORY_CARE_PROVIDER_SITE_OTHER): Payer: Medicare Other

## 2014-12-15 ENCOUNTER — Ambulatory Visit (INDEPENDENT_AMBULATORY_CARE_PROVIDER_SITE_OTHER): Payer: Medicare Other | Admitting: Family Medicine

## 2014-12-15 VITALS — BP 114/68 | HR 71 | Temp 98.3°F | Resp 16 | Ht 65.0 in | Wt 146.0 lb

## 2014-12-15 DIAGNOSIS — M25562 Pain in left knee: Secondary | ICD-10-CM

## 2014-12-15 NOTE — Patient Instructions (Signed)
Your x-ray was negative. The fact that the pain is largely controlled with Tylenol is reassuring.  I think it's a good plan to night to take the Tylenol every 6 hours as needed. This is already included in your orders  I will refer you to orthopedics as soon as they can see you.

## 2014-12-15 NOTE — Progress Notes (Signed)
Subjective:  This chart was scribed for [name], MD by Modena JanskyAlbert Thayil, ED Scribe. This patient was seen in room 10 and the patient's care was started at 9:00 PM.   Patient ID: Tami Harris, female    DOB: [birth year], 81 y.o.   MRN: [id number] Chief Complaint  Patient presents with   Leg Pain    left   HPI HPI Comments: Tami Harris is a 81 y.o. female who presents to the [facility] complaining of constant moderate LLE pain that started about 2 days ago.   She states that she started having constant moderate LLE pain that started about 2 days ago. She states that she is unaware of any injury. She states that the pain located right above her left ankle has been worsening. She reports that bearing weight and ambulating for long periods exacerbates the pain.   She reports that she has been having intermittent muscle spasms in the affect area.   Daughter states that pt has a hx of mild Alzheimer's and bilateral broken heels.   Patient Active Problem List   Diagnosis Date Noted   Pneumonia 10/18/2011   Dementia 10/18/2011   Past Medical History  Diagnosis Date   Allergic rhinitis    Memory loss    Dementia    Diverticulosis    Past Surgical History  Procedure Laterality Date   Inguinal hernia repair     Allergies  Allergen Reactions   Sulfa Antibiotics Other (See Comments)    unknown   Prior to Admission medications   Medication Sig Start Date End Date Taking? Authorizing Provider  calcium carbonate (TUMS - DOSED IN MG ELEMENTAL CALCIUM) 500 MG chewable tablet Chew 1 tablet by mouth daily.   Yes Historical Provider, MD  cholecalciferol (VITAMIN D) 1000 UNITS tablet Take 1,000 Units by mouth daily.   Yes Historical Provider, MD  donepezil (ARICEPT) 10 MG tablet Take 1 tablet (10 mg total) by mouth at bedtime. 10/19/11  Yes Vassie Lollarlos Madera, MD  loratadine (CLARITIN) 10 MG tablet Take 10 mg by mouth daily as needed. ALLERGIES   Yes  Historical Provider, MD  memantine (NAMENDA) 10 MG tablet Take 10 mg by mouth 2 (two) times daily.   Yes Historical Provider, MD  Multiple Vitamin (MULITIVITAMIN WITH MINERALS) TABS Take 1 tablet by mouth daily.   Yes Historical Provider, MD  multivitamin-lutein (OCUVITE-LUTEIN) CAPS Take 1 capsule by mouth daily.   Yes Historical Provider, MD  omega-3 acid ethyl esters (LOVAZA) 1 G capsule Take 2 g by mouth 2 (two) times daily.   Yes Historical Provider, MD  temazepam (RESTORIL) 15 MG capsule Take 15 mg by mouth at bedtime as needed.   Yes Historical Provider, MD  donepezil (ARICEPT) 10 MG tablet Take 1 tablet (10 mg total) by mouth at bedtime. 10/19/11 10/18/12  Vassie Lollarlos Madera, MD   History   Social History   Marital Status: Widowed    Spouse Name: N/A   Number of Children: N/A   Years of Education: N/A   Occupational History   Not on file.   Social History Main Topics   Smoking status: Never Smoker    Smokeless tobacco: Never Used   Alcohol Use: No   Drug Use: No   Sexual Activity: No   Other Topics Concern   Not on file   Social History Narrative    Review of Systems  Musculoskeletal: Positive for myalgias.       Objective:  Physical Exam Elderly woman in no acute distress company by her daughter Inspection of the left lower extremity shows no swelling, no redness, no bony abnormality. Patient's able to ambulate in the room without problem Patient has good distal pulse in her posterior tibial artery  [facility] reading (PRIMARY) by  Dr. Milus Glazier:  Left tib/fib. Is negative    BP 114/68 mmHg   Pulse 71   Temp(Src) 98.3 F (36.8 C) (Oral)   Resp 16   Ht  (1.651 m)   Wt 146 lb (66.225 kg)   BMI 24.30 kg/m2   SpO2 95%    Assessment & Plan:    It's difficult to figure out what is causing this pain. It may be an early stress fracture or maybe shinsplints. I see nothing to suggest a serious problem and the fact that it is controlled with Tylenol is very  reassuring.  This chart was scribed in my presence and reviewed by me personally.    ICD-9-CM ICD-10-CM   1. Pain in joint, lower leg, left 719.46 M25.562 DG Tibia/Fibula Left     Ambulatory referral to Orthopedic Surgery     Ambulatory referral to Orthopedic Surgery     Signed, Elvina Sidle, MD

## 2015-12-17 ENCOUNTER — Encounter: Payer: Self-pay | Admitting: Neurology

## 2015-12-17 ENCOUNTER — Ambulatory Visit (INDEPENDENT_AMBULATORY_CARE_PROVIDER_SITE_OTHER): Payer: Medicare Other | Admitting: Neurology

## 2015-12-17 VITALS — BP 120/68 | HR 68 | Ht 65.0 in | Wt 143.3 lb

## 2015-12-17 DIAGNOSIS — G309 Alzheimer's disease, unspecified: Secondary | ICD-10-CM | POA: Diagnosis not present

## 2015-12-17 DIAGNOSIS — G5732 Lesion of lateral popliteal nerve, left lower limb: Secondary | ICD-10-CM | POA: Diagnosis not present

## 2015-12-17 DIAGNOSIS — F0280 Dementia in other diseases classified elsewhere without behavioral disturbance: Secondary | ICD-10-CM

## 2015-12-17 NOTE — Progress Notes (Signed)
[facility] Clinic Note - Initial Visit   Date: 12/17/2015  Tami Harris MRN: [id number] DOB: 14-Sep-1924   Dear Dr. Pete Glatter:  Thank you for your kind referral of Tami Harris for consultation of left foot drop. Although her history is well known to you, please allow [place] to reiterate it for the purpose of our medical record. The patient was accompanied to the clinic by daughter [facility]) who also provides collateral information.     History of Present Illness: Tami Harris is a 82 y.o. right-handed Caucasian female with dementia (2010) and insomnia presenting for evaluation of left foot drop.    Starting around late January, she began noticing weakness of the left foot, falls, and gait instability.  She denies any low back or foot pain.  She walks independently although a walker has been suggested.  She started physical therapy for her leg weakness, but the therapist noticed that she was getting weaker and suggested she see a neurologist.  Because she had broken her left foot years ago, she had XR imaging of the foot this month, which did not show any acute dislocation or fracture.   She also has a history of dementia diagnosed in 2010.  Daughter states that she has not noticed any marked changes in her memory over the years.  She has been living in an assisted living facility.  She is able to bathe and dress herself.  Daughter is her POA.  Out-side paper records, electronic medical record, and images have been reviewed where available and summarized as:  CT head wo contrast 02/19/2009:  No evidence of acute intracranial abnormality.  Past Medical History  Diagnosis Date  . Allergic rhinitis   . Memory loss   . Dementia   . Diverticulosis     Past Surgical History  Procedure Laterality Date  . Inguinal hernia repair       Medications:  Outpatient Encounter Prescriptions as of 12/17/2015  Medication Sig Note  . calcium carbonate (TUMS - DOSED  IN MG ELEMENTAL CALCIUM) 500 MG chewable tablet Chew 1 tablet by mouth daily.   . cholecalciferol (VITAMIN D) 1000 UNITS tablet Take 1,000 Units by mouth daily.   Marland Kitchen donepezil (ARICEPT) 10 MG tablet Take 1 tablet (10 mg total) by mouth at bedtime.   Marland Kitchen loratadine (CLARITIN) 10 MG tablet Take 10 mg by mouth daily as needed. ALLERGIES   . Multiple Vitamin (MULITIVITAMIN WITH MINERALS) TABS Take 1 tablet by mouth daily.   . multivitamin-lutein (OCUVITE-LUTEIN) CAPS Take 1 capsule by mouth daily.   Marland Kitchen NAMENDA XR 28 MG CP24 24 hr capsule  12/17/2015: Received from: External Pharmacy  . omega-3 acid ethyl esters (LOVAZA) 1 G capsule Take 2 g by mouth 2 (two) times daily.   . temazepam (RESTORIL) 15 MG capsule Take 15 mg by mouth at bedtime as needed.   . donepezil (ARICEPT) 10 MG tablet Take 1 tablet (10 mg total) by mouth at bedtime.   . [DISCONTINUED] memantine (NAMENDA) 10 MG tablet Take 10 mg by mouth 2 (two) times daily.    No facility-administered encounter medications on file as of 12/17/2015.     Allergies:  Allergies  Allergen Reactions  . Sulfa Antibiotics Other (See Comments)    unknown    Family History: Family History  Problem Relation Age of Onset  . Dementia Mother   . Parkinsonism Brother     Social History: Social History  Substance Use Topics  . Smoking status: Never  Smoker   . Smokeless tobacco: Never Used  . Alcohol Use: No   Social History   Social History Narrative   [facility].  Has 1 daughter.  Retired from a [employer].  Education: high school.    Review of Systems:  CONSTITUTIONAL: No fevers, chills, night sweats, or weight loss.   EYES: No visual changes or eye pain ENT: No hearing changes.  No history of nose bleeds.   RESPIRATORY: No cough, wheezing and shortness of breath.   CARDIOVASCULAR: Negative for chest pain, and palpitations.   GI: Negative for abdominal discomfort, blood in stools or black stools.  No recent  change in bowel habits.   GU:  No history of incontinence.   MUSCLOSKELETAL: No history of joint pain or swelling.  No myalgias.   SKIN: Negative for lesions, rash, and itching.   HEMATOLOGY/ONCOLOGY: Negative for prolonged bleeding, bruising easily, and swollen nodes.  No history of cancer.   ENDOCRINE: Negative for cold or heat intolerance, polydipsia or goiter.   PSYCH:  No depression or anxiety symptoms.   NEURO: As Above.   Vital Signs:  BP 120/68 mmHg  Pulse 68  Ht 5\' 5"  (1.651 m)  Wt 143 lb 5 oz (65.006 kg)  BMI 23.85 kg/m2  SpO2 95% Pain Scale: 0 on a scale of 0-10   General Medical Exam:   General:  Well appearing, comfortable.   Eyes/ENT: see cranial nerve examination.   Neck: No masses appreciated.  Full range of motion without tenderness.  No carotid bruits. Respiratory:  Clear to auscultation, good air entry bilaterally.   Cardiac:  Regular rate and rhythm, no murmur.   Extremities:  No deformities, edema, or skin discoloration.  Skin:  No rashes or lesions.  Neurological Exam: MENTAL STATUS including orientation to self.  She does not know the year.  She can identify daughter and DOB correctly.  She has difficulty following 2-step commands.  Attention is good.  Speech is not dysarthric.  CRANIAL NERVES: II:  Central vision loss on the left eye, right visual field intact.  Limited fundoscopic exam due to small pupils.   III-IV-VI: Pupils equal round and reactive to light.  Normal conjugate, extra-ocular eye movements in all directions of gaze.  No nystagmus.  No ptosis.   V:  Normal facial sensation.   VII:  Normal facial symmetry and movements.   VIII:  Normal hearing and vestibular function.   IX-X:  Normal palatal movement.   XI:  Normal shoulder shrug and head rotation.   XII:  Normal tongue strength and range of motion, no deviation or fasciculation.  MOTOR:  No atrophy, fasciculations or abnormal movements.  No pronator drift.  Tone is normal.    Right  Upper Extremity:    Left Upper Extremity:    Deltoid  5/5   Deltoid  5/5   Biceps  5/5   Biceps  5/5   Triceps  5/5   Triceps  5/5   Wrist extensors  5/5   Wrist extensors  5/5   Wrist flexors  5/5   Wrist flexors  5/5   Finger extensors  5/5   Finger extensors  5/5   Finger flexors  5/5   Finger flexors  5/5   Dorsal interossei  5/5   Dorsal interossei  5/5   Abductor pollicis  5/5   Abductor pollicis  5/5   Tone (Ashworth scale)  0  Tone (Ashworth scale)  0  Right Lower Extremity:    Left Lower Extremity:    Hip flexors  5/5   Hip flexors  5/5   Hip extensors  5/5   Hip extensors  5/5   Knee flexors  5/5   Knee flexors  5/5   Knee extensors  5/5   Knee extensors  5/5   Dorsiflexors  5/5   Dorsiflexors  2/5   Inversion 5/5  Inversion 5/5  Eversion 5/5  Eversion 2/5  Plantarflexors  5/5   Plantarflexors  5/5   Toe extensors  5/5   Toe extensors  1+/5   Toe flexors  4/5   Toe flexors  4/5   Tone (Ashworth scale)  0  Tone (Ashworth scale)  0   MSRs:  Right                                                                 Left brachioradialis 2+  brachioradialis 2+  biceps 2+  biceps 2+  triceps 2+  triceps 2+  patellar 2+  patellar 2+  ankle jerk 0  ankle jerk 0  Hoffman no  Hoffman no  plantar response down  plantar response down   SENSORY: Reduced sensation to all modalities over the left superficial peroneal distribution.    COORDINATION/GAIT: Normal finger-to- nose-finger.  Intact rapid alternating movements bilaterally.  High steppage gait on the left, unsteady at times  IMPRESSION: 1.  Left peroneal mononeuropathy, less likely L5 radiculopathy given no back pain and preserved inversion  - NCS/EMG deferred as it would not change management  - Avoid crossing the legs to limit compression of the nerve around the knee  - Continue physical therapy  - Start using left AFO  - Start using a walker  - Fall precautions discussed  2.  Alzheimer's Dementia  - Continue Aricept   daily and Namenda XR  daily  Return to clinic in 3 months.   The duration of this appointment visit was 45 minutes of face-to-face time with the patient.  Greater than 50% of this time was spent in counseling, explanation of diagnosis, planning of further management, and coordination of care.   Thank you for allowing me to participate in patient's care.  If I can answer any additional questions, I would be pleased to do so.    Sincerely,    Donika K. Allena Katz, DO

## 2015-12-17 NOTE — Patient Instructions (Addendum)
1.  Continue physical therapy 2.  Start to use a left ankle foot orthotic 3.  Recommend using a walker   Return to clinic in 3 months

## 2015-12-18 ENCOUNTER — Encounter: Payer: Self-pay | Admitting: *Deleted

## 2015-12-18 NOTE — Progress Notes (Signed)
Voice mail full. Letter sent.

## 2016-02-18 ENCOUNTER — Ambulatory Visit
Admission: RE | Admit: 2016-02-18 | Discharge: 2016-02-18 | Disposition: A | Discharge status: Home / Self Care | Payer: Medicare Other | Source: Ambulatory Visit | Attending: Geriatric Medicine | Admitting: Geriatric Medicine

## 2016-02-18 ENCOUNTER — Other Ambulatory Visit: Payer: Self-pay | Admitting: Geriatric Medicine

## 2016-02-18 DIAGNOSIS — L03032 Cellulitis of left toe: Secondary | ICD-10-CM

## 2016-02-19 ENCOUNTER — Emergency Department (HOSPITAL_COMMUNITY)
Admission: EM | Admit: 2016-02-19 | Discharge: 2016-02-19 | Disposition: A | Discharge status: Home / Self Care | Payer: Medicare Other | Attending: Emergency Medicine | Admitting: Emergency Medicine

## 2016-02-19 ENCOUNTER — Encounter (HOSPITAL_COMMUNITY): Payer: Self-pay | Admitting: Emergency Medicine

## 2016-02-19 DIAGNOSIS — R55 Syncope and collapse: Secondary | ICD-10-CM | POA: Insufficient documentation

## 2016-02-19 DIAGNOSIS — N390 Urinary tract infection, site not specified: Secondary | ICD-10-CM

## 2016-02-19 DIAGNOSIS — Z79899 Other long term (current) drug therapy: Secondary | ICD-10-CM | POA: Diagnosis not present

## 2016-02-19 DIAGNOSIS — F0280 Dementia in other diseases classified elsewhere without behavioral disturbance: Secondary | ICD-10-CM | POA: Insufficient documentation

## 2016-02-19 DIAGNOSIS — G309 Alzheimer's disease, unspecified: Secondary | ICD-10-CM | POA: Diagnosis not present

## 2016-02-19 LAB — URINALYSIS, ROUTINE W REFLEX MICROSCOPIC
BILIRUBIN URINE: NEGATIVE
Glucose, UA: NEGATIVE mg/dL
HGB URINE DIPSTICK: NEGATIVE
Ketones, ur: 15 mg/dL — AB
Nitrite: NEGATIVE
Protein, ur: NEGATIVE mg/dL
SPECIFIC GRAVITY, URINE: 1.019 (ref 1.005–1.030)
pH: 6 (ref 5.0–8.0)

## 2016-02-19 LAB — COMPREHENSIVE METABOLIC PANEL
ALT: 13 U/L — AB (ref 14–54)
AST: 19 U/L (ref 15–41)
Albumin: 3.5 g/dL (ref 3.5–5.0)
Alkaline Phosphatase: 61 U/L (ref 38–126)
Anion gap: 9 (ref 5–15)
BILIRUBIN TOTAL: 0.4 mg/dL (ref 0.3–1.2)
BUN: 12 mg/dL (ref 6–20)
CHLORIDE: 106 mmol/L (ref 101–111)
CO2: 24 mmol/L (ref 22–32)
CREATININE: 0.9 mg/dL (ref 0.44–1.00)
Calcium: 9.4 mg/dL (ref 8.9–10.3)
GFR calc Af Amer: >60 mL/min (ref >60)
GFR, EST NON AFRICAN AMERICAN: 55 mL/min — AB (ref >60)
Glucose, Bld: 146 mg/dL — ABNORMAL HIGH (ref 65–99)
Potassium: 3.6 mmol/L (ref 3.5–5.1)
Sodium: 139 mmol/L (ref 135–145)
Total Protein: 6.4 g/dL — ABNORMAL LOW (ref 6.5–8.1)

## 2016-02-19 LAB — CBC WITH DIFFERENTIAL/PLATELET
BASOS ABS: 0 10*3/uL (ref 0.0–0.1)
Basophils Relative: 0 %
Eosinophils Absolute: 0.2 10*3/uL (ref 0.0–0.7)
Eosinophils Relative: 2 %
HEMATOCRIT: 39.5 % (ref 36.0–46.0)
HEMOGLOBIN: 12.3 g/dL (ref 12.0–15.0)
LYMPHS PCT: 17 %
Lymphs Abs: 1.3 10*3/uL (ref 0.7–4.0)
MCH: 29.1 pg (ref 26.0–34.0)
MCHC: 31.1 g/dL (ref 30.0–36.0)
MCV: 93.6 fL (ref 78.0–100.0)
Monocytes Absolute: 0.7 10*3/uL (ref 0.1–1.0)
Monocytes Relative: 9 %
NEUTROS ABS: 5.5 10*3/uL (ref 1.7–7.7)
NEUTROS PCT: 72 %
PLATELETS: 155 10*3/uL (ref 150–400)
RBC: 4.22 MIL/uL (ref 3.87–5.11)
RDW: 13.5 % (ref 11.5–15.5)
WBC: 7.7 10*3/uL (ref 4.0–10.5)

## 2016-02-19 LAB — URINE MICROSCOPIC-ADD ON

## 2016-02-19 LAB — TROPONIN I

## 2016-02-19 MED ORDER — FOSFOMYCIN TROMETHAMINE 3 G PO PACK
3.0000 g | PACK | Freq: Once | ORAL | Status: AC
  Administered 2016-02-19: 3 g via ORAL
  Filled 2016-02-19: qty 3

## 2016-02-19 NOTE — Discharge Instructions (Signed)
As discussed, your evaluation today has been largely reassuring.  But, it is important that you monitor your condition carefully, and do not hesitate to return to the ED if you develop new, or concerning changes in your condition. ? ?Otherwise, please follow-up with your physician for appropriate ongoing care. ? ?

## 2016-02-19 NOTE — ED Provider Notes (Signed)
CSN: [id number]     Arrival date & time 02/19/16  1345 History   First MD Initiated Contact with Patient 02/19/16 1355     Chief Complaint  Patient presents with  . Loss of Consciousness     (Consider location/radiation/quality/duration/timing/severity/associated sxs/prior Treatment) Patient is a 82 y.o. female presenting with syncope. The history is provided by the patient. No language interpreter was used.  Loss of Consciousness Episode history:  Multiple Most recent episode:  Today Timing:  Constant Progression:  Worsening Chronicity:  New Context: sitting down   Witnessed: no   Worsened by:  Nothing tried Ineffective treatments:  None tried Associated symptoms: no weakness   Risk factors: no seizures     Past Medical History  Diagnosis Date  . Allergic rhinitis   . Memory loss   . Dementia   . Diverticulosis    Past Surgical History  Procedure Laterality Date  . Inguinal hernia repair     Family History  Problem Relation Age of Onset  . Dementia Mother   . Parkinsonism Brother    Social History  Substance Use Topics  . Smoking status: Never Smoker   . Smokeless tobacco: Never Used  . Alcohol Use: No   OB History    No data available     Review of Systems  Cardiovascular: Positive for syncope.  Neurological: Negative for weakness.  All other systems reviewed and are negative.     Allergies  Sulfa antibiotics  Home Medications   Prior to Admission medications   Medication Sig Start Date End Date Taking? Authorizing Provider  calcium carbonate (TUMS - DOSED IN MG ELEMENTAL CALCIUM) 500 MG chewable tablet Chew 1 tablet by mouth daily.    Historical Provider, MD  cholecalciferol (VITAMIN D) 1000 UNITS tablet Take 1,000 Units by mouth daily.    Historical Provider, MD  donepezil (ARICEPT) 10 MG tablet Take 1 tablet (10 mg total) by mouth at bedtime. 10/19/11 10/18/12  Vassie Lollarlos Madera, MD  donepezil (ARICEPT) 10 MG tablet Take 1 tablet (10 mg total) by  mouth at bedtime. 10/19/11   Vassie Lollarlos Madera, MD  loratadine (CLARITIN) 10 MG tablet Take 10 mg by mouth daily as needed. ALLERGIES    Historical Provider, MD  Multiple Vitamin (MULITIVITAMIN WITH MINERALS) TABS Take 1 tablet by mouth daily.    Historical Provider, MD  multivitamin-lutein [facility](OCUVITE-LUTEIN) CAPS Take 1 capsule by mouth daily.    Historical Provider, MD  NAMENDA XR 28 MG CP24 24 hr capsule  11/08/15   Historical Provider, MD  omega-3 acid ethyl esters (LOVAZA) 1 G capsule Take 2 g by mouth 2 (two) times daily.    Historical Provider, MD  temazepam (RESTORIL) 15 MG capsule Take 15 mg by mouth at bedtime as needed.    Historical Provider, MD   BP 146/61 mmHg  Pulse 56  Resp 17  SpO2 96% Physical Exam  Constitutional: She is oriented to person, place, and time. She appears well-developed and well-nourished.  HENT:  Head: Normocephalic and atraumatic.  Eyes: Conjunctivae and EOM are normal. Pupils are equal, round, and reactive to light.  Neck: Normal range of motion.  Cardiovascular: Normal rate.   Pulmonary/Chest: Effort normal.  Abdominal: Soft. She exhibits no distension.  Musculoskeletal: Normal range of motion.  Neurological: She is alert and oriented to person, place, and time.  Psychiatric: She has a normal mood and affect.  Nursing note and vitals reviewed.   ED Course  Procedures (including critical care time) Labs Review Labs  Reviewed - No data to display  Imaging Review Dg Foot Complete Left  02/18/2016  CLINICAL DATA:  Sore on medial distal great toe. Evaluate for osteomyelitis. EXAM: LEFT FOOT - COMPLETE 3+ VIEW COMPARISON:  None. FINDINGS: A hallux valgus deformity is identified. No acute fractures. No bony erosion to suggest osteomyelitis. A skin defect over the distal first metatarsal is not excluded on oblique imaging. There is no underlying bony erosion in this region however. Recommend clinical correlation. IMPRESSION: No evidence of osteomyelitis.  Electronically Signed   By: Gerome Sam III M.D   On: 02/18/2016 18:54   I have personally reviewed and evaluated these images and lab results as part of my medical decision-making.   EKG Interpretation None      MDM   Final diagnoses:  Syncope, unspecified syncope type    ua pending  Pt's care turned over to Dr. Tyson Babinski pending.      Lonia Skinner Girard, PA-C 02/19/16 1536  Margarita Grizzle, MD 02/20/16 [phone]

## 2016-02-19 NOTE — ED Notes (Signed)
[facility], via [facility] after 2 syncopal episodes during [employer] game. Pt has hx of Alzheimer's and HLD. Pt alert to self and place occasionally only, currently asking "why am I here?". Denies pain, n/v or sob. CBG was 160 per EMS. When EMS arrived, BP was 136/74.

## 2016-02-19 NOTE — ED Notes (Signed)
This RN wheeled patient to restroom to provide urine sample. Hat placed on toilet seat but was unable to catch the urine. Pt reports she will notify staff when she has to urinate again.

## 2016-02-25 ENCOUNTER — Encounter (HOSPITAL_BASED_OUTPATIENT_CLINIC_OR_DEPARTMENT_OTHER): Payer: Medicare Other | Attending: Internal Medicine

## 2016-02-25 DIAGNOSIS — N186 End stage renal disease: Secondary | ICD-10-CM | POA: Insufficient documentation

## 2016-02-25 DIAGNOSIS — L97521 Non-pressure chronic ulcer of other part of left foot limited to breakdown of skin: Secondary | ICD-10-CM | POA: Insufficient documentation

## 2016-02-25 DIAGNOSIS — G40909 Epilepsy, unspecified, not intractable, without status epilepticus: Secondary | ICD-10-CM | POA: Diagnosis not present

## 2016-02-25 DIAGNOSIS — Z881 Allergy status to other antibiotic agents status: Secondary | ICD-10-CM | POA: Diagnosis not present

## 2016-02-25 DIAGNOSIS — F0280 Dementia in other diseases classified elsewhere without behavioral disturbance: Secondary | ICD-10-CM | POA: Insufficient documentation

## 2016-02-25 DIAGNOSIS — L97524 Non-pressure chronic ulcer of other part of left foot with necrosis of bone: Secondary | ICD-10-CM | POA: Insufficient documentation

## 2016-02-25 DIAGNOSIS — L03032 Cellulitis of left toe: Secondary | ICD-10-CM | POA: Insufficient documentation

## 2016-02-25 DIAGNOSIS — G309 Alzheimer's disease, unspecified: Secondary | ICD-10-CM | POA: Diagnosis not present

## 2016-02-25 DIAGNOSIS — I70245 Atherosclerosis of native arteries of left leg with ulceration of other part of foot: Secondary | ICD-10-CM | POA: Diagnosis present

## 2016-02-29 ENCOUNTER — Other Ambulatory Visit: Payer: Self-pay | Admitting: Internal Medicine

## 2016-02-29 ENCOUNTER — Other Ambulatory Visit: Payer: Self-pay | Admitting: Geriatric Medicine

## 2016-02-29 DIAGNOSIS — S81809A Unspecified open wound, unspecified lower leg, initial encounter: Secondary | ICD-10-CM

## 2016-02-29 DIAGNOSIS — L03032 Cellulitis of left toe: Secondary | ICD-10-CM

## 2016-03-03 DIAGNOSIS — I70245 Atherosclerosis of native arteries of left leg with ulceration of other part of foot: Secondary | ICD-10-CM | POA: Diagnosis not present

## 2016-03-07 ENCOUNTER — Encounter (HOSPITAL_BASED_OUTPATIENT_CLINIC_OR_DEPARTMENT_OTHER): Payer: Medicare Other

## 2016-03-08 ENCOUNTER — Ambulatory Visit (HOSPITAL_COMMUNITY)
Admission: RE | Admit: 2016-03-08 | Discharge: 2016-03-08 | Disposition: A | Discharge status: Home / Self Care | Payer: Medicare Other | Source: Ambulatory Visit | Attending: Cardiology | Admitting: Cardiology

## 2016-03-08 DIAGNOSIS — S81809A Unspecified open wound, unspecified lower leg, initial encounter: Secondary | ICD-10-CM | POA: Diagnosis not present

## 2016-03-08 DIAGNOSIS — X58XXXA Exposure to other specified factors, initial encounter: Secondary | ICD-10-CM | POA: Diagnosis not present

## 2016-03-08 DIAGNOSIS — I771 Stricture of artery: Secondary | ICD-10-CM | POA: Insufficient documentation

## 2016-03-08 DIAGNOSIS — I700 Atherosclerosis of aorta: Secondary | ICD-10-CM | POA: Diagnosis not present

## 2016-03-09 ENCOUNTER — Other Ambulatory Visit: Payer: Medicare Other

## 2016-03-10 ENCOUNTER — Encounter: Payer: Self-pay | Admitting: Neurology

## 2016-03-10 ENCOUNTER — Ambulatory Visit (INDEPENDENT_AMBULATORY_CARE_PROVIDER_SITE_OTHER): Payer: Medicare Other | Admitting: Neurology

## 2016-03-10 VITALS — BP 100/68 | HR 68 | Ht 65.0 in | Wt 147.1 lb

## 2016-03-10 DIAGNOSIS — G5730 Lesion of lateral popliteal nerve, unspecified lower limb: Secondary | ICD-10-CM | POA: Insufficient documentation

## 2016-03-10 DIAGNOSIS — F0280 Dementia in other diseases classified elsewhere without behavioral disturbance: Secondary | ICD-10-CM

## 2016-03-10 DIAGNOSIS — G309 Alzheimer's disease, unspecified: Secondary | ICD-10-CM

## 2016-03-10 DIAGNOSIS — G5732 Lesion of lateral popliteal nerve, left lower limb: Secondary | ICD-10-CM

## 2016-03-10 NOTE — Patient Instructions (Signed)
Return to clinic in 6 months.

## 2016-03-10 NOTE — Progress Notes (Addendum)
Follow-up Visit   Date: 03/10/2016    Oswald HillockHilda B Molitor MRN: [id number] DOB: July 25, 1924   Interim History: Oswald HillockHilda B Fujii is a 82 y.o. right-handed Caucasian female with dementia (2010) and insomnia returning to the clinic for follow-up of left foot drop.  The patient was accompanied to the clinic by daughter who also provides collateral information.    History of present illness: Starting around late January 2017, she began noticing weakness of the left foot, falls, and gait instability. She denies any low back or foot pain. She walks independently although a walker has been suggested. She started physical therapy for her leg weakness, but the therapist noticed that she was getting weaker and suggested she see a neurologist. Because she had broken her left foot years ago, she had XR imaging of the foot this month, which did not show any acute dislocation or fracture.   She also has a history of dementia diagnosed in 2010. Daughter states that she has not noticed any marked changes in her memory over the years. She has been living in an assisted living facility. She is able to bathe and dress herself. Daughter is her POA.  UPDATE 03/10/2016:   Since her last visit, she has developed an infection bunion over the left great toe and is seeing wound care for this.  She did complete PT and has noticed mild improvement of her left foot weakness, but still tends to drag it.  No new complaints.    Medications:  Current Outpatient Prescriptions on File Prior to Visit  Medication Sig Dispense Refill  . acetaminophen (TYLENOL) 325 MG tablet Take 325 mg by mouth every 6 (six) hours as needed for mild pain.    Marland Kitchen. amoxicillin-clavulanate (AUGMENTIN) 875-125 MG tablet Take 1 tablet by mouth 2 (two) times daily.    . bacitracin ointment Apply 1 application topically See admin instructions. Apply to left toe bunion daily    . calcium carbonate (TUMS - DOSED IN MG ELEMENTAL CALCIUM) 500 MG  chewable tablet Chew 1 tablet by mouth daily.    Marland Kitchen. donepezil (ARICEPT) 10 MG tablet Take 1 tablet (10 mg total) by mouth at bedtime.    . memantine (NAMENDA) 10 MG tablet Take 10 mg by mouth 2 (two) times daily.    . multivitamin-lutein (OCUVITE-LUTEIN) CAPS Take 1 capsule by mouth daily.    Marland Kitchen. omega-3 acid ethyl esters (LOVAZA) 1 G capsule Take 2 g by mouth 2 (two) times daily.    Marland Kitchen. donepezil (ARICEPT) 10 MG tablet Take 1 tablet (10 mg total) by mouth at bedtime.     No current facility-administered medications on file prior to visit.    Allergies:  Allergies  Allergen Reactions  . Sulfa Antibiotics Other (See Comments)    unknown    Review of Systems:  CONSTITUTIONAL: No fevers, chills, night sweats, or weight loss.  EYES: No visual changes or eye pain ENT: No hearing changes.  No history of nose bleeds.   RESPIRATORY: No cough, wheezing and shortness of breath.   CARDIOVASCULAR: Negative for chest pain, and palpitations.   GI: Negative for abdominal discomfort, blood in stools or black stools.  No recent change in bowel habits.   GU:  No history of incontinence.   MUSCLOSKELETAL: No history of joint pain or swelling.  No myalgias.   SKIN: Negative for lesions, rash, and itching.   ENDOCRINE: Negative for cold or heat intolerance, polydipsia or goiter.   PSYCH:  No depression or anxiety  symptoms.   NEURO: As Above.   Vital Signs:  BP 100/68 mmHg  Pulse 68  Ht 5\' 5"  (1.651 m)  Wt 147 lb 1 oz (66.707 kg)  BMI 24.47 kg/m2  SpO2 95%  Neurological Exam: MENTAL STATUS:  Sleepy, but easily arousible.  She is oriented to self, year, and month.  She correctly identifies her daughter.  She follows simple commands and engages in conversation when prompted only.  Answers questions appropriately.  CRANIAL NERVES:Face is symmetric.   MOTOR:  Motor strength is 5/5 in all extremities, except left dorsiflexion is 3/4, left eversion is 4/5, right eversion 5-/5.  Bilateral inversion is  5/5.  COORDINATION/GAIT: There is high steppage gait on the left, she walks with rollator.   Data: CT head wo contrast 02/19/2009: No evidence of acute intracranial abnormality.  IMPRESSION/PLAN: 1. Left peroneal mononeuropathy, less likely L5 radiculopathy given no back pain and preserved inversion  - Clinically showing mild improvement - Avoid crossing the legs to limit compression of the nerve around the knee - Continue physical therapy - Continue using walker  2. Alzheimer's Dementia - Continue Aricept 10mg  daily and Namenda 10mg  BID  Return to clinic in 6 months  The duration of this appointment visit was 25 minutes of face-to-face time with the patient.  Greater than 50% of this time was spent in counseling, explanation of diagnosis, planning of further management, and coordination of care.   Thank you for allowing me to participate in patient's care.  If I can answer any additional questions, I would be pleased to do so.    Sincerely,    [place] Merry K. Allena KatzPatel, DO

## 2016-03-11 ENCOUNTER — Other Ambulatory Visit: Payer: Self-pay | Admitting: *Deleted

## 2016-03-11 DIAGNOSIS — R2681 Unsteadiness on feet: Secondary | ICD-10-CM

## 2016-03-11 DIAGNOSIS — G3183 Dementia with Lewy bodies: Principal | ICD-10-CM

## 2016-03-11 DIAGNOSIS — F0280 Dementia in other diseases classified elsewhere without behavioral disturbance: Secondary | ICD-10-CM

## 2016-03-11 DIAGNOSIS — I70245 Atherosclerosis of native arteries of left leg with ulceration of other part of foot: Secondary | ICD-10-CM | POA: Diagnosis not present

## 2016-03-11 MED ORDER — MEMANTINE HCL 10 MG PO TABS: 10.0000 mg | ORAL_TABLET | Freq: Two times a day (BID) | ORAL | Status: DC

## 2016-03-16 ENCOUNTER — Telehealth: Payer: Self-pay | Admitting: Neurology

## 2016-03-16 ENCOUNTER — Telehealth: Payer: Self-pay | Admitting: *Deleted

## 2016-03-16 DIAGNOSIS — I739 Peripheral vascular disease, unspecified: Secondary | ICD-10-CM

## 2016-03-16 NOTE — Telephone Encounter (Signed)
Tami Harris [birth year]. Gilda from [employer] called and would like a copy of the last office note 03/10/16. The fax number is [phone]. Thank you

## 2016-03-16 NOTE — Telephone Encounter (Signed)
Scheduler spoke with patient's daughter concerning her foot wound. Daughter said it was a bunion that is healing but they have been referred to Dr Allyson SabalBerry. Information presented to Dr Allyson SabalBerry and he advised for patient to have lower arterial doppler studies and to see him in July. Scheduler, Billie, to call patient and schedule the test. Order entered into EPIC.

## 2016-03-17 NOTE — Telephone Encounter (Signed)
Note faxed.

## 2016-03-18 ENCOUNTER — Encounter: Payer: Medicare Other | Admitting: Cardiovascular Disease

## 2016-03-18 DIAGNOSIS — I70245 Atherosclerosis of native arteries of left leg with ulceration of other part of foot: Secondary | ICD-10-CM | POA: Diagnosis not present

## 2016-03-25 ENCOUNTER — Encounter (HOSPITAL_BASED_OUTPATIENT_CLINIC_OR_DEPARTMENT_OTHER): Payer: Medicare Other | Attending: Internal Medicine

## 2016-03-25 DIAGNOSIS — I70245 Atherosclerosis of native arteries of left leg with ulceration of other part of foot: Secondary | ICD-10-CM | POA: Diagnosis present

## 2016-03-25 DIAGNOSIS — B9562 Methicillin resistant Staphylococcus aureus infection as the cause of diseases classified elsewhere: Secondary | ICD-10-CM | POA: Diagnosis not present

## 2016-03-25 DIAGNOSIS — L97521 Non-pressure chronic ulcer of other part of left foot limited to breakdown of skin: Secondary | ICD-10-CM | POA: Diagnosis not present

## 2016-03-25 DIAGNOSIS — N186 End stage renal disease: Secondary | ICD-10-CM | POA: Insufficient documentation

## 2016-03-25 DIAGNOSIS — F0390 Unspecified dementia without behavioral disturbance: Secondary | ICD-10-CM | POA: Diagnosis not present

## 2016-04-01 DIAGNOSIS — I70245 Atherosclerosis of native arteries of left leg with ulceration of other part of foot: Secondary | ICD-10-CM | POA: Diagnosis not present

## 2016-04-07 DIAGNOSIS — I70245 Atherosclerosis of native arteries of left leg with ulceration of other part of foot: Secondary | ICD-10-CM | POA: Diagnosis not present

## 2016-04-08 ENCOUNTER — Encounter: Payer: Self-pay | Admitting: Cardiovascular Disease

## 2016-04-08 ENCOUNTER — Ambulatory Visit
Admission: RE | Admit: 2016-04-08 | Discharge: 2016-04-08 | Disposition: A | Discharge status: Home / Self Care | Payer: Medicare Other | Source: Ambulatory Visit | Attending: Cardiovascular Disease | Admitting: Cardiovascular Disease

## 2016-04-08 ENCOUNTER — Other Ambulatory Visit: Payer: Self-pay | Admitting: *Deleted

## 2016-04-08 ENCOUNTER — Ambulatory Visit (INDEPENDENT_AMBULATORY_CARE_PROVIDER_SITE_OTHER): Payer: Medicare Other | Admitting: Cardiovascular Disease

## 2016-04-08 VITALS — BP 131/72 | HR 53 | Ht 65.0 in | Wt 145.0 lb

## 2016-04-08 DIAGNOSIS — Z01818 Encounter for other preprocedural examination: Secondary | ICD-10-CM

## 2016-04-08 DIAGNOSIS — I70229 Atherosclerosis of native arteries of extremities with rest pain, unspecified extremity: Secondary | ICD-10-CM

## 2016-04-08 DIAGNOSIS — Z79899 Other long term (current) drug therapy: Secondary | ICD-10-CM | POA: Diagnosis not present

## 2016-04-08 DIAGNOSIS — I998 Other disorder of circulatory system: Secondary | ICD-10-CM | POA: Diagnosis not present

## 2016-04-08 DIAGNOSIS — D689 Coagulation defect, unspecified: Secondary | ICD-10-CM

## 2016-04-08 LAB — BASIC METABOLIC PANEL
BUN: 20 mg/dL (ref 7–25)
CALCIUM: 9.3 mg/dL (ref 8.6–10.4)
CO2: 28 mmol/L (ref 20–31)
CREATININE: 1.07 mg/dL — AB (ref 0.60–0.88)
Chloride: 102 mmol/L (ref 98–110)
GLUCOSE: 82 mg/dL (ref 65–99)
Potassium: 4.4 mmol/L (ref 3.5–5.3)
SODIUM: 140 mmol/L (ref 135–146)

## 2016-04-08 LAB — CBC WITH DIFFERENTIAL/PLATELET
BASOS ABS: 0 {cells}/uL (ref 0–200)
Basophils Relative: 0 %
EOS PCT: 3 %
Eosinophils Absolute: 231 cells/uL (ref 15–500)
HCT: 37.1 % (ref 35.0–45.0)
Hemoglobin: 12.2 g/dL (ref 11.7–15.5)
LYMPHS ABS: 1540 {cells}/uL (ref 850–3900)
LYMPHS PCT: 20 %
MCH: 29.4 pg (ref 27.0–33.0)
MCHC: 32.9 g/dL (ref 32.0–36.0)
MCV: 89.4 fL (ref 80.0–100.0)
MONOS PCT: 10 %
MPV: 13.2 fL — ABNORMAL HIGH (ref 7.5–12.5)
Monocytes Absolute: 770 cells/uL (ref 200–950)
NEUTROS PCT: 67 %
Neutro Abs: 5159 cells/uL (ref 1500–7800)
PLATELETS: 230 10*3/uL (ref 140–400)
RBC: 4.15 MIL/uL (ref 3.80–5.10)
RDW: 13.3 % (ref 11.0–15.0)
WBC: 7.7 10*3/uL (ref 3.8–10.8)

## 2016-04-08 LAB — TSH: TSH: 0.93 mIU/L

## 2016-04-08 NOTE — Patient Instructions (Signed)
Medication Instructions:  Your physician recommends that you continue on your current medications as directed. Please refer to the Current Medication list given to you today.   Testing/Procedures: Dr. Berry has ordered a peripheral angiogram to be done at [facility].  This procedure is going to look at the bloodflow in your lower extremities.  If Dr. Berry is able to open up the arteries, you will have to spend one night in the hospital.  If he is not able to open the arteries, you will be able to go home that same day.    After the procedure, you will not be allowed to drive for 3 days or push, pull, or lift anything greater than 10 lbs for one week.    You will be required to have the following tests prior to the procedure:  1. Blood work-the blood work can be done no more than 14 days prior to the procedure.  It can be done at any [facility] lab.  There is one downstairs on the first floor of this building and one in the [facility] building ([address], [address])  2. Chest Xray-the chest xray order has already been placed at the [facility] Building.     *REPS  SCOTT  Puncture site RIGHT GROIN   If you need a refill on your cardiac medications before your next appointment, please call your pharmacy.   

## 2016-04-08 NOTE — Assessment & Plan Note (Signed)
This reason was referred to me by Dr. Roxan Hockeyobinson at the [facility] care clinic for evaluation of critical limb ischemia. She's had nonhealing ulcer on the lateral aspect of her left great toe for the last 2 months. She has been nonambulatory since that time and has seen Dr. Leanord Hawkingobson at the wound care center for weekly debridement. Recent arterial Dopplers in our office performed 03/08/16 revealed right ABI 0.59 occluded right SFA in the midportion left ABI of 0.35 long segment occlusion of the left SFA. She is fairly somnolent today and has a history of dementia. Her quality of life is markedly diminished and she is now nonambulatory and wheelchair bound. We talked about the risks and benefits of the procedure with the patient's daughter and agreed to proceed for quality of life issues.

## 2016-04-08 NOTE — Progress Notes (Signed)
04/08/2016 Oswald HillockHilda B Browe   10/16/1923  [id number]  Primary Physician Ginette OttoSTONEKING,HAL THOMAS, MD Primary Cardiologist: Runell GessJonathan J Yisroel Mullendore MD Roseanne RenoFACP, FACC, FAHA, FSCAI  HPI:  Mrs. Emelda FearFerguson is a 82 year old widowed Caucasian female mother of one daughter Annice Pih(Jackie) who accompanies her today. [facility] lives in [facility]. She has no cardiac risk factors. She is a DO NOT RESUSCITATE. She developed a ischemic ulcer on her left great toe approximately 2 months ago and since that time she has been nonambulatory. She has seen Dr. Leanord Hawkingobson at the wound care center on a weekly basis who has been debriding this. He also has painful. Dopplers performed in our office 03/08/16 right ABI 0.59 and a left appointment 35. She has occluded SFAs bilaterally. She does have dementia and is on medications for this. She is fairly somnolent in the office today. We talked about medical therapy which probably would ultimately lead to amputation significant pain until that time versus attempt at angiography in percutaneous revascularization. I do not think she has a femoropopliteal candidate given her age and comorbidities.   Current Outpatient Prescriptions  Medication Sig Dispense Refill  . acetaminophen (TYLENOL) 325 MG tablet Take 325 mg by mouth every 6 (six) hours as needed for mild pain.    . bacitracin ointment Apply 1 application topically See admin instructions. Apply to left toe bunion daily    . calcium carbonate (TUMS - DOSED IN MG ELEMENTAL CALCIUM) 500 MG chewable tablet Chew 1 tablet by mouth daily.    Marland Kitchen. dextromethorphan (DELSYM) 30 MG/5ML liquid Take 15 mg by mouth as needed for cough.    . doxycycline (VIBRAMYCIN) 100 MG capsule   10  . memantine (NAMENDA) 10 MG tablet Take 1 tablet (10 mg total) by mouth 2 (two) times daily. 60 tablet 3  . multivitamin-lutein (OCUVITE-LUTEIN) CAPS Take 1 capsule by mouth daily.    Marland Kitchen. omega-3 acid ethyl esters (LOVAZA) 1 G capsule Take 2 g by mouth 2 (two)  times daily.    . Omega-3 Fatty Acids (FISH OIL) 1000 MG CAPS Take 1 capsule by mouth daily.    . traMADol (ULTRAM) 50 MG tablet   4  . donepezil (ARICEPT) 10 MG tablet Take 1 tablet (10 mg total) by mouth at bedtime.     No current facility-administered medications for this visit.    Allergies  Allergen Reactions  . Sulfa Antibiotics Other (See Comments)    unknown    Social History   Social History  . Marital Status: Widowed    Spouse Name: N/A  . Number of Children: N/A  . Years of Education: N/A   Occupational History  . Not on file.   Social History Main Topics  . Smoking status: Never Smoker   . Smokeless tobacco: Never Used  . Alcohol Use: No  . Drug Use: No  . Sexual Activity: No   Other Topics Concern  . Not on file   Social History Narrative   [facility] with a roommate.  Has 1 daughter.  Retired from a [employer].  Education: high school.     Review of Systems: General: negative for chills, fever, night sweats or weight changes.  Cardiovascular: negative for chest pain, dyspnea on exertion, edema, orthopnea, palpitations, paroxysmal nocturnal dyspnea or shortness of breath Dermatological: negative for rash Respiratory: negative for cough or wheezing Urologic: negative for hematuria Abdominal: negative for nausea, vomiting, diarrhea, bright red blood per rectum, melena, or hematemesis Neurologic:  negative for visual changes, syncope, or dizziness All other systems reviewed and are otherwise negative except as noted above.    Blood pressure 131/72, pulse 53, height  (1.651 m), weight 145 lb (65.772 kg), SpO2 98 %.  General appearance: alert and no distress Neck: no adenopathy, no carotid bruit, no JVD, supple, symmetrical, trachea midline and thyroid not enlarged, symmetric, no tenderness/mass/nodules Lungs: clear to auscultation bilaterally Heart: regular rate and rhythm, S1, S2 normal, no murmur, click, rub or  gallop Extremities: There is an ischemic ulcer left great toe  EKG not performed today  ASSESSMENT AND PLAN:   Critical lower limb ischemia This reason was referred to me by Dr. Roxan Hockey at the [facility] wound care clinic for evaluation of critical limb ischemia. She's had nonhealing ulcer on the lateral aspect of her left great toe for the last 2 months. She has been nonambulatory since that time and has seen Dr. Leanord Hawking at the wound care center for weekly debridement. Recent arterial Dopplers in our office performed 03/08/16 revealed right ABI 0.59 occluded right SFA in the midportion left ABI of 0.35 long segment occlusion of the left SFA. She is fairly somnolent today and has a history of dementia. Her quality of life is markedly diminished and she is now nonambulatory and wheelchair bound. We talked about the risks and benefits of the procedure with the patient's daughter and agreed to proceed for quality of life issues.      Runell Gess MD FACP,FACC,FAHA, [employer] 04/08/2016 11:35 AM

## 2016-04-09 LAB — APTT: aPTT: 28 s (ref 22–34)

## 2016-04-09 LAB — PROTIME-INR
INR: 1
PROTHROMBIN TIME: 11 s (ref 9.0–11.5)

## 2016-04-15 DIAGNOSIS — I70245 Atherosclerosis of native arteries of left leg with ulceration of other part of foot: Secondary | ICD-10-CM | POA: Diagnosis not present

## 2016-04-18 ENCOUNTER — Encounter (HOSPITAL_COMMUNITY): Payer: Self-pay | Admitting: Neurology

## 2016-04-18 ENCOUNTER — Encounter (HOSPITAL_COMMUNITY)
Admission: RE | Disposition: A | Discharge status: Skilled Nursing Facility | Payer: Self-pay | Source: Ambulatory Visit | Attending: Emergency Medicine

## 2016-04-18 ENCOUNTER — Ambulatory Visit (HOSPITAL_COMMUNITY): Payer: Medicare Other

## 2016-04-18 ENCOUNTER — Observation Stay (HOSPITAL_COMMUNITY)
Admission: RE | Admit: 2016-04-18 | Discharge: 2016-04-20 | Disposition: A | Discharge status: Skilled Nursing Facility | Payer: Medicare Other | Source: Ambulatory Visit | Attending: Family Medicine | Admitting: Family Medicine

## 2016-04-18 DIAGNOSIS — W010XXA Fall on same level from slipping, tripping and stumbling without subsequent striking against object, initial encounter: Secondary | ICD-10-CM | POA: Diagnosis not present

## 2016-04-18 DIAGNOSIS — R55 Syncope and collapse: Secondary | ICD-10-CM | POA: Diagnosis not present

## 2016-04-18 DIAGNOSIS — R531 Weakness: Secondary | ICD-10-CM | POA: Insufficient documentation

## 2016-04-18 DIAGNOSIS — Z993 Dependence on wheelchair: Secondary | ICD-10-CM | POA: Diagnosis not present

## 2016-04-18 DIAGNOSIS — F0390 Unspecified dementia without behavioral disturbance: Secondary | ICD-10-CM

## 2016-04-18 DIAGNOSIS — S0081XA Abrasion of other part of head, initial encounter: Secondary | ICD-10-CM | POA: Insufficient documentation

## 2016-04-18 DIAGNOSIS — I739 Peripheral vascular disease, unspecified: Secondary | ICD-10-CM | POA: Diagnosis not present

## 2016-04-18 DIAGNOSIS — S52501A Unspecified fracture of the lower end of right radius, initial encounter for closed fracture: Secondary | ICD-10-CM | POA: Diagnosis not present

## 2016-04-18 DIAGNOSIS — I70245 Atherosclerosis of native arteries of left leg with ulceration of other part of foot: Secondary | ICD-10-CM | POA: Insufficient documentation

## 2016-04-18 DIAGNOSIS — Z66 Do not resuscitate: Secondary | ICD-10-CM | POA: Diagnosis not present

## 2016-04-18 DIAGNOSIS — R262 Difficulty in walking, not elsewhere classified: Secondary | ICD-10-CM | POA: Diagnosis not present

## 2016-04-18 DIAGNOSIS — E860 Dehydration: Secondary | ICD-10-CM | POA: Insufficient documentation

## 2016-04-18 DIAGNOSIS — S52509A Unspecified fracture of the lower end of unspecified radius, initial encounter for closed fracture: Secondary | ICD-10-CM | POA: Diagnosis present

## 2016-04-18 DIAGNOSIS — W19XXXA Unspecified fall, initial encounter: Secondary | ICD-10-CM | POA: Diagnosis present

## 2016-04-18 DIAGNOSIS — I70229 Atherosclerosis of native arteries of extremities with rest pain, unspecified extremity: Secondary | ICD-10-CM | POA: Diagnosis present

## 2016-04-18 DIAGNOSIS — I998 Other disorder of circulatory system: Secondary | ICD-10-CM

## 2016-04-18 DIAGNOSIS — L97529 Non-pressure chronic ulcer of other part of left foot with unspecified severity: Secondary | ICD-10-CM | POA: Insufficient documentation

## 2016-04-18 HISTORY — DX: Unspecified fall, initial encounter: W19.XXXA

## 2016-04-18 HISTORY — DX: Headache: R51

## 2016-04-18 HISTORY — DX: Headache, unspecified: R51.9

## 2016-04-18 HISTORY — DX: Diaphragmatic hernia without obstruction or gangrene: K44.9

## 2016-04-18 HISTORY — DX: Family history of other specified conditions: Z84.89

## 2016-04-18 HISTORY — DX: Unspecified fracture of unspecified forearm, initial encounter for closed fracture: S52.90XA

## 2016-04-18 HISTORY — DX: Gastro-esophageal reflux disease without esophagitis: K21.9

## 2016-04-18 HISTORY — DX: Alzheimer's disease, unspecified: G30.9

## 2016-04-18 HISTORY — DX: Exudative age-related macular degeneration, left eye, stage unspecified: H35.3220

## 2016-04-18 HISTORY — DX: Other seasonal allergic rhinitis: J30.2

## 2016-04-18 HISTORY — DX: Pneumonia, unspecified organism: J18.9

## 2016-04-18 HISTORY — DX: Dementia in other diseases classified elsewhere, unspecified severity, without behavioral disturbance, psychotic disturbance, mood disturbance, and anxiety: F02.80

## 2016-04-18 LAB — URINALYSIS, ROUTINE W REFLEX MICROSCOPIC
Bilirubin Urine: NEGATIVE
GLUCOSE, UA: NEGATIVE mg/dL
Hgb urine dipstick: NEGATIVE
KETONES UR: NEGATIVE mg/dL
Nitrite: NEGATIVE
PROTEIN: NEGATIVE mg/dL
Specific Gravity, Urine: 1.023 (ref 1.005–1.030)
pH: 6.5 (ref 5.0–8.0)

## 2016-04-18 LAB — CBC WITH DIFFERENTIAL/PLATELET
Basophils Absolute: 0 10*3/uL (ref 0.0–0.1)
Basophils Relative: 0 %
EOS PCT: 1 %
Eosinophils Absolute: 0.1 10*3/uL (ref 0.0–0.7)
HCT: 37.5 % (ref 36.0–46.0)
Hemoglobin: 11.9 g/dL — ABNORMAL LOW (ref 12.0–15.0)
LYMPHS ABS: 1.2 10*3/uL (ref 0.7–4.0)
LYMPHS PCT: 10 %
MCH: 28.8 pg (ref 26.0–34.0)
MCHC: 31.7 g/dL (ref 30.0–36.0)
MCV: 90.8 fL (ref 78.0–100.0)
MONO ABS: 1.1 10*3/uL — AB (ref 0.1–1.0)
Monocytes Relative: 9 %
Neutro Abs: 10 10*3/uL — ABNORMAL HIGH (ref 1.7–7.7)
Neutrophils Relative %: 80 %
PLATELETS: 208 10*3/uL (ref 150–400)
RBC: 4.13 MIL/uL (ref 3.87–5.11)
RDW: 13.6 % (ref 11.5–15.5)
WBC: 12.4 10*3/uL — ABNORMAL HIGH (ref 4.0–10.5)

## 2016-04-18 LAB — URINE MICROSCOPIC-ADD ON

## 2016-04-18 LAB — BASIC METABOLIC PANEL
Anion gap: 6 (ref 5–15)
BUN: 13 mg/dL (ref 6–20)
CALCIUM: 9 mg/dL (ref 8.9–10.3)
CO2: 25 mmol/L (ref 22–32)
Chloride: 106 mmol/L (ref 101–111)
Creatinine, Ser: 0.91 mg/dL (ref 0.44–1.00)
GFR calc Af Amer: >60 mL/min (ref >60)
GFR, EST NON AFRICAN AMERICAN: 54 mL/min — AB (ref >60)
GLUCOSE: 113 mg/dL — AB (ref 65–99)
Potassium: 4.3 mmol/L (ref 3.5–5.1)
Sodium: 137 mmol/L (ref 135–145)

## 2016-04-18 LAB — POC OCCULT BLOOD, ED: Fecal Occult Bld: NEGATIVE

## 2016-04-18 SURGERY — LOWER EXTREMITY ANGIOGRAPHY
Anesthesia: LOCAL

## 2016-04-18 MED ORDER — ACETAMINOPHEN 325 MG PO TABS: 650.0000 mg | ORAL_TABLET | Freq: Four times a day (QID) | ORAL | Status: DC | PRN

## 2016-04-18 MED ORDER — SODIUM CHLORIDE 0.9 % WEIGHT BASED INFUSION: 1.0000 mL/kg/h | INTRAVENOUS | Status: DC

## 2016-04-18 MED ORDER — SODIUM CHLORIDE 0.9 % WEIGHT BASED INFUSION: 3.0000 mL/kg/h | INTRAVENOUS | Status: DC

## 2016-04-18 MED ORDER — SODIUM CHLORIDE 0.9% FLUSH
3.0000 mL | Freq: Two times a day (BID) | INTRAVENOUS | Status: DC
  Administered 2016-04-18 – 2016-04-20 (×4): 3 mL via INTRAVENOUS

## 2016-04-18 MED ORDER — SODIUM CHLORIDE 0.9 % IV SOLN
INTRAVENOUS | Status: AC
  Administered 2016-04-18: 13:00:00 via INTRAVENOUS

## 2016-04-18 MED ORDER — SODIUM CHLORIDE 0.9% FLUSH: 3.0000 mL | INTRAVENOUS | Status: DC | PRN

## 2016-04-18 MED ORDER — DONEPEZIL HCL 10 MG PO TABS
10.0000 mg | ORAL_TABLET | Freq: Every day | ORAL | Status: DC
  Administered 2016-04-18 – 2016-04-19 (×2): 10 mg via ORAL
  Filled 2016-04-18 (×2): qty 1

## 2016-04-18 MED ORDER — BISACODYL 10 MG RE SUPP: 10.0000 mg | Freq: Every day | RECTAL | Status: DC | PRN

## 2016-04-18 MED ORDER — DOXYCYCLINE HYCLATE 100 MG PO TABS
100.0000 mg | ORAL_TABLET | Freq: Two times a day (BID) | ORAL | Status: DC
  Administered 2016-04-18 – 2016-04-20 (×5): 100 mg via ORAL
  Filled 2016-04-18 (×5): qty 1

## 2016-04-18 MED ORDER — ASPIRIN 81 MG PO CHEW: 81.0000 mg | CHEWABLE_TABLET | ORAL | Status: DC

## 2016-04-18 MED ORDER — ONDANSETRON HCL 4 MG/2ML IJ SOLN: 4.0000 mg | Freq: Four times a day (QID) | INTRAMUSCULAR | Status: DC | PRN

## 2016-04-18 MED ORDER — ACETAMINOPHEN 650 MG RE SUPP: 650.0000 mg | Freq: Four times a day (QID) | RECTAL | Status: DC | PRN

## 2016-04-18 MED ORDER — HYDROCODONE-ACETAMINOPHEN 5-325 MG PO TABS
1.0000 | ORAL_TABLET | ORAL | Status: DC | PRN
  Administered 2016-04-18 – 2016-04-20 (×3): 1 via ORAL
  Filled 2016-04-18 (×3): qty 1

## 2016-04-18 MED ORDER — ONDANSETRON HCL 4 MG PO TABS: 4.0000 mg | ORAL_TABLET | Freq: Four times a day (QID) | ORAL | Status: DC | PRN

## 2016-04-18 MED ORDER — ENOXAPARIN SODIUM 40 MG/0.4ML ~~LOC~~ SOLN: 40.0000 mg | SUBCUTANEOUS | Status: DC

## 2016-04-18 MED ORDER — DEXTROMETHORPHAN POLISTIREX ER 30 MG/5ML PO SUER
30.0000 mg | Freq: Three times a day (TID) | ORAL | Status: DC | PRN
  Filled 2016-04-18: qty 5

## 2016-04-18 MED ORDER — ALUM & MAG HYDROXIDE-SIMETH 200-200-20 MG/5ML PO SUSP: 30.0000 mL | Freq: Four times a day (QID) | ORAL | Status: DC | PRN

## 2016-04-18 MED ORDER — TRAMADOL HCL 50 MG PO TABS
50.0000 mg | ORAL_TABLET | Freq: Three times a day (TID) | ORAL | Status: DC | PRN
  Administered 2016-04-18 – 2016-04-19 (×2): 50 mg via ORAL
  Filled 2016-04-18 (×2): qty 1

## 2016-04-18 NOTE — ED Provider Notes (Signed)
[facility]-EMERGENCY DEPT Provider Note   CSN: [id number] Arrival date & time: 04/18/16  4098  First Provider Contact:  First MD Initiated Contact with Patient 04/18/16 [phone]        History   Chief Complaint Chief Complaint  Patient presents with  . Fall    HPI Tami Harris is a 82 y.o. female.  HPI Pt resides at an assisted living facility.  Family thinks she might have fallen last evening.  Pt does not recall having a fall but this am they noticed she had an abrasion (carpet burn?) on her forehead and nose.  She was complaining of rib pain and wrist pain when she was first woken up. Past Medical History:  Diagnosis Date  . Allergic rhinitis   . Dementia   . Diverticulosis   . Memory loss     Patient Active Problem List   Diagnosis Date Noted  . Critical lower limb ischemia 04/08/2016  . 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 Pneumonia 10/18/2011  . Dementia 10/18/2011    Past Surgical History:  Procedure Laterality Date  . INGUINAL HERNIA REPAIR      OB History    No data available       Home Medications    Prior to Admission medications   Medication Sig Start Date End Date Taking? Authorizing Provider  acetaminophen (TYLENOL) 325 MG tablet Take 325 mg by mouth every 6 (six) hours as needed for mild pain.   Yes Historical Provider, MD  calcium carbonate (TUMS - DOSED IN MG ELEMENTAL CALCIUM) 500 MG chewable tablet Chew 1 tablet by mouth daily.   Yes Historical Provider, MD  dextromethorphan (DELSYM) 30 MG/5ML liquid Take 30 mg by mouth every 8 (eight) hours as needed for cough.    Yes Historical Provider, MD  donepezil (ARICEPT) 10 MG tablet Take 1 tablet (10 mg total) by mouth at bedtime. 10/19/11 04/13/16 Yes Vassie Loll, MD  doxycycline (VIBRAMYCIN) 100 MG capsule Take 100 mg by mouth 2 (two) times daily.  04/07/16  Yes Historical Provider, MD  memantine (NAMENDA) 10 MG tablet Take 1 tablet (10 mg total) by mouth 2 (two) times daily. 03/11/16  Yes Donika K  Patel, DO  mineral oil external liquid Take 3 drops by mouth once a week. At bedtime every week on Wednesdays to both ears   Yes Historical Provider, MD  multivitamin-lutein (OCUVITE-LUTEIN) CAPS Take 1 capsule by mouth daily.   Yes Historical Provider, MD  Omega-3 Fatty Acids (FISH OIL) 1000 MG CAPS Take 1 capsule by mouth daily.   Yes Historical Provider, MD  traMADol (ULTRAM) 50 MG tablet Take 50 mg by mouth every 8 (eight) hours as needed for moderate pain.  04/05/16  Yes Historical Provider, MD    Family History Family History  Problem Relation Age of Onset  . Dementia Mother   . Heart failure Mother   . Heart failure Father   . Parkinsonism Brother     Social History Social History  Substance Use Topics  . Smoking status: Never Smoker  . Smokeless tobacco: Never Used  . Alcohol use No     Allergies   Sulfa antibiotics   Review of Systems Review of Systems   Physical Exam Updated Vital Signs BP (!) 144/50 (BP Location: Right Arm)   Pulse (!) 48   Temp 97.4 F (36.3 C) (Oral)   Resp 14   SpO2 96%   Physical Exam  Constitutional: No distress.  Elderly frail  HENT:  Head: Normocephalic.  Right Ear: External ear normal.  Left Ear: External ear normal.  Abrasions forehead and nose  Eyes: Conjunctivae are normal. Right eye exhibits no discharge. Left eye exhibits no discharge. No scleral icterus.  Neck: Neck supple. No tracheal deviation present.  Cardiovascular: Normal rate, regular rhythm and intact distal pulses.   Pulmonary/Chest: Effort normal and breath sounds normal. No stridor. No respiratory distress. She has no wheezes. She has no rales.  Abdominal: Soft. Bowel sounds are normal. She exhibits no distension. There is no tenderness. There is no rebound and no guarding.  Musculoskeletal: She exhibits no edema.       Right wrist: She exhibits tenderness and bony tenderness. She exhibits no swelling.       Cervical back: She exhibits no tenderness.        Thoracic back: She exhibits no tenderness.       Lumbar back: She exhibits no tenderness.  Ulceration left foot, mild erythema below the knee and foot,   Neurological: She is alert. She has normal strength. No cranial nerve deficit (no facial droop, extraocular movements intact, no slurred speech) or sensory deficit. She exhibits normal muscle tone. She displays no seizure activity. Coordination normal.  Skin: Skin is warm and dry. No rash noted.  Psychiatric: She has a normal mood and affect.  Nursing note and vitals reviewed.    ED Treatments / Results  Labs (all labs ordered are listed, but only abnormal results are displayed) Labs Reviewed  CBC WITH DIFFERENTIAL/PLATELET - Abnormal; Notable for the following:       Result Value   WBC 12.4 (*)    Hemoglobin 11.9 (*)    Neutro Abs 10.0 (*)    Monocytes Absolute 1.1 (*)    All other components within normal limits  BASIC METABOLIC PANEL - Abnormal; Notable for the following:    Glucose, Bld 113 (*)    GFR calc non Af Amer 54 (*)    All other components within normal limits  URINALYSIS, ROUTINE W REFLEX MICROSCOPIC (NOT AT [facility]) - Abnormal; Notable for the following:    APPearance CLOUDY (*)    Leukocytes, UA MODERATE (*)    All other components within normal limits  BASIC METABOLIC PANEL - Abnormal; Notable for the following:    Calcium 8.6 (*)    All other components within normal limits  CBC - Abnormal; Notable for the following:    RBC 3.85 (*)    Hemoglobin 11.0 (*)    HCT 35.1 (*)    All other components within normal limits  URINE MICROSCOPIC-ADD ON - Abnormal; Notable for the following:    Squamous Epithelial / LPF 6-30 (*)    Bacteria, UA FEW (*)    Casts HYALINE CASTS (*)    All other components within normal limits  GLUCOSE, CAPILLARY  POC OCCULT BLOOD, ED    EKG  EKG Interpretation  Date/Time:  Monday April 18 2016 08:54:29 EDT Ventricular Rate:  50 PR Interval:    QRS Duration: 89 QT  Interval:  479 QTC Calculation: 437 R Axis:   47 Text Interpretation:  Sinus rhythm Abnormal R-wave progression, early transition No significant change since last tracing Confirmed by Treyshawn Muldrew  MD-J, Laquanda Bick ([id number]) on 04/18/2016 8:57:15 AM       Radiology Dg Chest 2 View  Result Date: 04/18/2016 CLINICAL DATA:  Fall today EXAM: CHEST  2 VIEW COMPARISON:  04/08/2016 FINDINGS: Large hiatal hernia. Heart is mildly enlarged. Scarring in the lingula. Right lung is clear.  No effusions. No acute bony abnormality. IMPRESSION: Large hiatal hernia.  Lingular scarring.  No active disease. Electronically Signed   By: Charlett Nose M.D.   On: 04/18/2016 09:22  Dg Wrist Complete Right  Result Date: 04/18/2016 CLINICAL DATA:  Pain following fall EXAM: RIGHT WRIST - COMPLETE 3+ VIEW COMPARISON:  None. FINDINGS: Frontal, oblique, lateral, and ulnar deviation scaphoid images were obtained. There is a transversely oriented fracture of the distal radial metaphysis with dorsal angulation distally. No other fracture. No dislocation. Bones are somewhat osteoporotic. The joint spaces appear normal. There are foci of arterial calcification distally. IMPRESSION: Fracture distal radial metaphysis with dorsal angulation distally. Bones osteoporotic. No dislocation. Joint spaces appear intact in the wrist region. Electronically Signed   By: Bretta Bang III M.D.   On: 04/18/2016 09:21  Ct Head Wo Contrast  Result Date: 04/18/2016 CLINICAL DATA:  Fall.  Abrasion to forehead. EXAM: CT HEAD WITHOUT CONTRAST TECHNIQUE: Contiguous axial images were obtained from the base of the skull through the vertex without intravenous contrast. COMPARISON:  June 15, 2011 FINDINGS: Paranasal sinuses, mastoid air cells, bones, and extracranial soft tissues images. No subdural, epidural, or subarachnoid hemorrhage. Ventricles and sulci are prominent but stable. Cerebellum, brainstem, and basal cisterns are normal. No mass, mass effect, or  midline shift. Scattered white matter changes with no evidence of acute cortical ischemia or infarct. IMPRESSION: Mild white matter changes.  No acute intracranial process. Electronically Signed   By: Gerome Sam III M.D   On: 04/18/2016 09:58  Dg Hand Complete Right  Result Date: 04/18/2016 CLINICAL DATA:  Pain following fall EXAM: RIGHT HAND - COMPLETE 3+ VIEW COMPARISON:  None. FINDINGS: Frontal, oblique, and lateral views were obtained. There is a fracture of the distal radial metaphysis with dorsal angulation distally. No other fracture. No dislocation. There is moderate narrowing of all DIP joints. There is milder narrowing of all MCP and PIP joints. No erosive change. IMPRESSION: Fracture distal radial metaphysis with dorsal angulation distally. No other fracture. No dislocation. Multiple foci of osteoarthritic change, most notably in the distal joints. Electronically Signed   By: Bretta Bang III M.D.   On: 04/18/2016 09:20   Procedures Procedures (including critical care time) Wrist splinted in the ED.  Medications Ordered in ED none  Initial Impression / Assessment and Plan / ED Course  I have reviewed the triage vital signs and the nursing notes.  Pertinent labs & imaging results that were available during my care of the patient were reviewed by me and considered in my medical decision making (see chart for details).  Clinical Course   Pt presented to the ED after notifying staff of a fall prior to her planned vascular procedure.  Fx of wrist noted on xray. Discussed findings with Dr Allyson Sabal.  Will have to defer procedure for now.  Will admit for observation, possible syncopal event.     Final Clinical Impressions(s) / ED Diagnoses   Final diagnoses:  Fall, initial encounter  Distal radius fracture, right, closed, initial encounter      Linwood Dibbles, MD 04/19/16 1536

## 2016-04-18 NOTE — H&P (Signed)
History and Physical    Tami Harris WJX:[id number] DOB: [birth year] DOA: 04/18/2016  PCP: Ginette Otto, MD Patient coming from: assisted living   Chief Complaint: unwitnessed fall/right wrist pain  HPI: Tami Harris is a very pleasant  82 y.o. female with medical history significant for nonhealing ischemic ulcer left great toe being seen after Roxan Hockey at [facility] wound care clinic, dementia presents to the emergency Department chief complaint of fall. Initial evaluation reveals right radial fracture. It is unclear cause of her fall some concern for syncope.  Information is obtained from the chart and the daughters who are at the bedside as well as the patient. Of note information from patient may be unreliable secondary to dementia. Patient reports pain in her right wrist. She is oriented to self and place but has no recollection of falling or even getting out of bed. Daughter reports when she came to see patient this morning she noticed abrasions on her forehead and nose testing that she likely fell during the night. No report of any recent illness nausea vomiting diarrhea. No recent complaints of any chest pain coughing fever. Patient was nothing by mouth from midnight last night in anticipation of angiographically and percutaneous revascularization procedures scheduled for today. In addition family reports patient has been on and off antibiotics for over a month due to this nonhealing wound. Daughter reports a week ago she was placed back on antibiotics and patient has a history of "briefly blacking out" when on antibiotics. Daughter suspect this also contributed to her fall. Family reports patient is usually ambulatory but has recently been wheelchair-bound due to worsening pain to the left foot secondary to the nonhealing wound. Agent usually oriented to self and place continent of bowel and bladder and is able to make her wants and needs known    ED Course: In the emergency  department she's hemodynamically stable afebrile not hypoxic.  Review of Systems: As per HPI otherwise 10 point review of systems negative.   Ambulatory Status: Recently in wheelchair due to non-healing wound  Past Medical History:  Diagnosis Date  . Allergic rhinitis   . Dementia   . Diverticulosis   . Fall   . Hiatal hernia   . Memory loss   . Radial fracture     Past Surgical History:  Procedure Laterality Date  . INGUINAL HERNIA REPAIR      Social History   Social History  . Marital status: Widowed    Spouse name: N/A  . Number of children: N/A  . Years of education: N/A   Occupational History  . Not on file.   Social History Main Topics  . Smoking status: Never Smoker  . Smokeless tobacco: Never Used  . Alcohol use No  . Drug use: No  . Sexual activity: No   Other Topics Concern  . Not on file   Social History Narrative   Lives at [facility] with a roommate.  Has 1 daughter.  Retired from a [employer].  Education: high school.    Allergies  Allergen Reactions  . Sulfa Antibiotics Other (See Comments)    unknown    Family History  Problem Relation Age of Onset  . Dementia Mother   . Heart failure Mother   . Heart failure Father   . Parkinsonism Brother     Prior to Admission medications   Medication Sig Start Date End Date Taking? Authorizing Provider  acetaminophen (TYLENOL) 325 MG tablet Take 325 mg by mouth  every 6 (six) hours as needed for mild pain.   Yes Historical Provider, MD  calcium carbonate (TUMS - DOSED IN MG ELEMENTAL CALCIUM) 500 MG chewable tablet Chew 1 tablet by mouth daily.   Yes Historical Provider, MD  dextromethorphan (DELSYM) 30 MG/5ML liquid Take 30 mg by mouth every 8 (eight) hours as needed for cough.    Yes Historical Provider, MD  donepezil (ARICEPT) 10 MG tablet Take 1 tablet (10 mg total) by mouth at bedtime. 10/19/11 04/13/16 Yes Vassie Loll, MD  doxycycline (VIBRAMYCIN) 100 MG capsule Take 100 mg by mouth 2  (two) times daily.  04/07/16  Yes Historical Provider, MD  memantine (NAMENDA) 10 MG tablet Take 1 tablet (10 mg total) by mouth 2 (two) times daily. 03/11/16  Yes Donika K Patel, DO  mineral oil external liquid Take 3 drops by mouth once a week. At bedtime every week on Wednesdays to both ears   Yes Historical Provider, MD  multivitamin-lutein (OCUVITE-LUTEIN) CAPS Take 1 capsule by mouth daily.   Yes Historical Provider, MD  Omega-3 Fatty Acids (FISH OIL) 1000 MG CAPS Take 1 capsule by mouth daily.   Yes Historical Provider, MD  traMADol (ULTRAM) 50 MG tablet Take 50 mg by mouth every 8 (eight) hours as needed for moderate pain.  04/05/16  Yes Historical Provider, MD    Physical Exam: Vitals:   04/18/16 0812 04/18/16 0830 04/18/16 0845 04/18/16 0948  BP: (!) 144/50 132/60 151/60 (!) 158/53  Pulse: (!) 48 (!) 49 (!) 50 (!) 51  Resp: 14 17 16 22   Temp: 97.4 F (36.3 C)     TempSrc: Oral     SpO2: 96% 98% 96% 98%     General:  Appears calm and comfortable. Well nourished Eyes:  PERRL, EOMI, normal lids, iris ENT:  grossly normal hearing, lips & tongue, because membranes of her mouth are slightly dry but pink Neck:  no LAD, masses or thyromegaly Cardiovascular:  RRR, no m/r/g. No LE edema. Pedal pulses present and palpable. Radial pulse on right palpable Respiratory:  CTA bilaterally, no w/r/r. Normal respiratory effort. Abdomen:  soft, ntnd, positive bowel sounds throughout no guarding or rebounding Skin:  no rash or induration seen on limited exam, nonhealing ulcer left great toe Musculoskeletal:  grossly normal tone BUE/BLE, good ROM, no bony abnormality, splint on right wrist fingers warm to touch Psychiatric:  grossly normal mood and affect, speech fluent and appropriate, AOx3 Neurologic:  CN 2-12 grossly intact, moves all extremities in coordinated fashion, sensation intact  Labs on Admission: I have personally reviewed following labs and imaging studies  CBC:  Recent Labs Lab  04/18/16 0850  WBC 12.4*  NEUTROABS 10.0*  HGB 11.9*  HCT 37.5  MCV 90.8  PLT 208   Basic Metabolic Panel:  Recent Labs Lab 04/18/16 0850  NA 137  K 4.3  CL 106  CO2 25  GLUCOSE 113*  BUN 13  CREATININE 0.91  CALCIUM 9.0   GFR: Estimated Creatinine Clearance: 37 mL/min (by C-G formula based on SCr of 0.91 mg/dL). Liver Function Tests: No results for input(s): AST, ALT, ALKPHOS, BILITOT, PROT, ALBUMIN in the last 168 hours. No results for input(s): LIPASE, AMYLASE in the last 168 hours. No results for input(s): AMMONIA in the last 168 hours. Coagulation Profile: No results for input(s): INR, PROTIME in the last 168 hours. Cardiac Enzymes: No results for input(s): CKTOTAL, CKMB, CKMBINDEX, TROPONINI in the last 168 hours. BNP (last 3 results) No results for input(s):  PROBNP in the last 8760 hours. HbA1C: No results for input(s): HGBA1C in the last 72 hours. CBG: No results for input(s): GLUCAP in the last 168 hours. Lipid Profile: No results for input(s): CHOL, HDL, LDLCALC, TRIG, CHOLHDL, LDLDIRECT in the last 72 hours. Thyroid Function Tests: No results for input(s): TSH, T4TOTAL, FREET4, T3FREE, THYROIDAB in the last 72 hours. Anemia Panel: No results for input(s): VITAMINB12, FOLATE, FERRITIN, TIBC, IRON, RETICCTPCT in the last 72 hours. Urine analysis:    Component Value Date/Time   COLORURINE YELLOW 02/19/2016 1711   APPEARANCEUR CLOUDY (A) 02/19/2016 1711   LABSPEC 1.019 02/19/2016 1711   PHURINE 6.0 02/19/2016 1711   GLUCOSEU NEGATIVE 02/19/2016 1711   HGBUR NEGATIVE 02/19/2016 1711   BILIRUBINUR NEGATIVE 02/19/2016 1711   KETONESUR 15 (A) 02/19/2016 1711   PROTEINUR NEGATIVE 02/19/2016 1711   UROBILINOGEN 0.2 06/15/2011 1033   NITRITE NEGATIVE 02/19/2016 1711   LEUKOCYTESUR MODERATE (A) 02/19/2016 1711    Creatinine Clearance: Estimated Creatinine Clearance: 37 mL/min (by C-G formula based on SCr of 0.91 mg/dL).  Sepsis  Labs: @LABRCNTIP (procalcitonin:4,lacticidven:4) )No results found for this or any previous visit (from the past 240 hour(s)).   Radiological Exams on Admission: Dg Chest 2 View  Result Date: 04/18/2016 CLINICAL DATA:  Fall today EXAM: CHEST  2 VIEW COMPARISON:  04/08/2016 FINDINGS: Large hiatal hernia. Heart is mildly enlarged. Scarring in the lingula. Right lung is clear. No effusions. No acute bony abnormality. IMPRESSION: Large hiatal hernia.  Lingular scarring.  No active disease. Electronically Signed   By: Charlett Nose M.D.   On: 04/18/2016 09:22  Dg Wrist Complete Right  Result Date: 04/18/2016 CLINICAL DATA:  Pain following fall EXAM: RIGHT WRIST - COMPLETE 3+ VIEW COMPARISON:  None. FINDINGS: Frontal, oblique, lateral, and ulnar deviation scaphoid images were obtained. There is a transversely oriented fracture of the distal radial metaphysis with dorsal angulation distally. No other fracture. No dislocation. Bones are somewhat osteoporotic. The joint spaces appear normal. There are foci of arterial calcification distally. IMPRESSION: Fracture distal radial metaphysis with dorsal angulation distally. Bones osteoporotic. No dislocation. Joint spaces appear intact in the wrist region. Electronically Signed   By: Bretta Bang III M.D.   On: 04/18/2016 09:21  Ct Head Wo Contrast  Result Date: 04/18/2016 CLINICAL DATA:  Fall.  Abrasion to forehead. EXAM: CT HEAD WITHOUT CONTRAST TECHNIQUE: Contiguous axial images were obtained from the base of the skull through the vertex without intravenous contrast. COMPARISON:  June 15, 2011 FINDINGS: Paranasal sinuses, mastoid air cells, bones, and extracranial soft tissues images. No subdural, epidural, or subarachnoid hemorrhage. Ventricles and sulci are prominent but stable. Cerebellum, brainstem, and basal cisterns are normal. No mass, mass effect, or midline shift. Scattered white matter changes with no evidence of acute cortical ischemia or  infarct. IMPRESSION: Mild white matter changes.  No acute intracranial process. Electronically Signed   By: Gerome Sam III M.D   On: 04/18/2016 09:58  Dg Hand Complete Right  Result Date: 04/18/2016 CLINICAL DATA:  Pain following fall EXAM: RIGHT HAND - COMPLETE 3+ VIEW COMPARISON:  None. FINDINGS: Frontal, oblique, and lateral views were obtained. There is a fracture of the distal radial metaphysis with dorsal angulation distally. No other fracture. No dislocation. There is moderate narrowing of all DIP joints. There is milder narrowing of all MCP and PIP joints. No erosive change. IMPRESSION: Fracture distal radial metaphysis with dorsal angulation distally. No other fracture. No dislocation. Multiple foci of osteoarthritic change, most notably in the  distal joints. Electronically Signed   By: Bretta Bang III M.D.   On: 04/18/2016 09:20   EKG: Independently reviewed. Sinus rhythm Abnormal R-wave progression, early transition No significant change since last tracing  Assessment/Plan Principal Problem:   Syncope Active Problems:   Dementia   Critical lower limb ischemia   Fall   Radial fracture   #1. Syncope. Patient was suitably fell during the night given facial abrasions discovered small morning. Recently started on antibiotics and family reports in the past have caused her to be altered. Patient has no recollection of event. CT of the head without acute abnormalities. No signs of infection. No metabolic derangements. EKG without acute changes. -Admit to telemetry -Obtain orthostatics -Gentle IV fluids -Hold any altering medications  #2. Radial fracture right. Secondary to fall. Chest x-ray as noted above Splint applied in the emergency department. -Analgesia -Hold Lovenox -Physical therapy -Outpatient follow-up  #3. Critical lower limb ischemia/nonhealing wound. Scheduled for revascularization procedure today. This is been postponed secondary to #2. Chart review indicates  being seen by [facility] wound clinic. Family reports "Infection in bone but x-ray done last month negative for osteomyelitis. Chart review also indicates evaluated by vascular surgery and office note visit documents right ABI 0.59 with occluded SFAs bilaterally. -Outpatient follow-up to reschedule -Continue antibiotics -Wound consult to avoid interruption in wound care  #4. Fall. Etiology unclear. Likely multifactorial.  However patient recently became wheelchair-bound secondary to pain in left foot from nonhealing ulcer. She also has dementia perhaps got up during the night for getting her pain and fell. Family thinks antibiotics playing a role. CT of the head without acute abnormality. See #1 -physical therapy    DVT prophylaxis: scd's  Code Status: dnr  Family Communication: daughters  Disposition Plan: back to facility  Consults called: dr berry  Admission status: obs    Gwenyth Bender MD [facility]  If 7PM-7AM, please contact night-coverage [email or web address] Password [email or web address]  04/18/2016, 11:12 AM

## 2016-04-18 NOTE — Progress Notes (Signed)
Pt has arrived to 2W from ED. Telemetry box has been applied and CCMD has been notified. Pt has been oriented to room and is resting comfortably in bed. Pt arrived with right wrist splint for a fracture she got at her assisted living facility. Pt reports no pain at this time. Family at bedside. Will continue to monitor.   Berdine Dance BSN, RN

## 2016-04-18 NOTE — ED Triage Notes (Addendum)
Pt coming from short stay today after family reports she had a fall likely last night at the assisted living facility. Pt was to have an angiogram procedure today with Dr. Allyson Sabal to increases blood flow to left foot. Sent here to evaluation. Pt has abrasion to forehead and nasal bridge. Pt has hx of dementia. Pt is drowsy, family reports staff gave the patient her pain meds prior to leaving, which was tramadol. Also pain medication dose was increased to twice daily.

## 2016-04-19 ENCOUNTER — Encounter (HOSPITAL_COMMUNITY): Payer: Self-pay | Admitting: General Practice

## 2016-04-19 DIAGNOSIS — F0390 Unspecified dementia without behavioral disturbance: Secondary | ICD-10-CM | POA: Diagnosis not present

## 2016-04-19 DIAGNOSIS — W19XXXD Unspecified fall, subsequent encounter: Secondary | ICD-10-CM

## 2016-04-19 DIAGNOSIS — S52501D Unspecified fracture of the lower end of right radius, subsequent encounter for closed fracture with routine healing: Secondary | ICD-10-CM

## 2016-04-19 DIAGNOSIS — Z5189 Encounter for other specified aftercare: Secondary | ICD-10-CM

## 2016-04-19 DIAGNOSIS — I998 Other disorder of circulatory system: Secondary | ICD-10-CM | POA: Diagnosis not present

## 2016-04-19 DIAGNOSIS — W19XXXA Unspecified fall, initial encounter: Secondary | ICD-10-CM | POA: Diagnosis not present

## 2016-04-19 DIAGNOSIS — S5290XD Unspecified fracture of unspecified forearm, subsequent encounter for closed fracture with routine healing: Secondary | ICD-10-CM

## 2016-04-19 DIAGNOSIS — R5381 Other malaise: Secondary | ICD-10-CM

## 2016-04-19 DIAGNOSIS — E860 Dehydration: Secondary | ICD-10-CM

## 2016-04-19 DIAGNOSIS — I999 Unspecified disorder of circulatory system: Secondary | ICD-10-CM

## 2016-04-19 LAB — BASIC METABOLIC PANEL
ANION GAP: 6 (ref 5–15)
BUN: 14 mg/dL (ref 6–20)
CALCIUM: 8.6 mg/dL — AB (ref 8.9–10.3)
CO2: 25 mmol/L (ref 22–32)
Chloride: 106 mmol/L (ref 101–111)
Creatinine, Ser: 0.8 mg/dL (ref 0.44–1.00)
GFR calc Af Amer: >60 mL/min (ref >60)
GLUCOSE: 92 mg/dL (ref 65–99)
Potassium: 3.8 mmol/L (ref 3.5–5.1)
SODIUM: 137 mmol/L (ref 135–145)

## 2016-04-19 LAB — CBC
HCT: 35.1 % — ABNORMAL LOW (ref 36.0–46.0)
HEMOGLOBIN: 11 g/dL — AB (ref 12.0–15.0)
MCH: 28.6 pg (ref 26.0–34.0)
MCHC: 31.3 g/dL (ref 30.0–36.0)
MCV: 91.2 fL (ref 78.0–100.0)
Platelets: 200 10*3/uL (ref 150–400)
RBC: 3.85 MIL/uL — ABNORMAL LOW (ref 3.87–5.11)
RDW: 13.8 % (ref 11.5–15.5)
WBC: 10 10*3/uL (ref 4.0–10.5)

## 2016-04-19 LAB — MRSA PCR SCREENING: MRSA by PCR: NEGATIVE

## 2016-04-19 LAB — GLUCOSE, CAPILLARY: Glucose-Capillary: 97 mg/dL (ref 65–99)

## 2016-04-19 MED ORDER — SODIUM CHLORIDE 0.9 % IV SOLN
INTRAVENOUS | Status: AC
  Administered 2016-04-19: 19:00:00 via INTRAVENOUS

## 2016-04-19 NOTE — Clinical Social Work Note (Signed)
CSW spoke with patient and patient's daughter regarding discharge planning for an anticipated discharge to [facility] tomorrow.   [employer], LCSW [phone]

## 2016-04-19 NOTE — Care Management Obs Status (Signed)
MEDICARE OBSERVATION STATUS NOTIFICATION   Patient Details  Name: Tami Harris MRN: [id number] Date of Birth: June 27, 1924   Medicare Observation Status Notification Given:  Yes    Elliot Cousin, RN 04/19/2016, 6:38 PM

## 2016-04-19 NOTE — Care Management Note (Signed)
Case Management Note  Patient Details  Name: Tami Harris MRN: [id number] Date of Birth: 08-15-1924  Subjective/Objective:     Falls               Action/Plan: Discharge Planning:  NCM spoke to pt and dtr, Tami Harris at bedside. Spoke to CSW and SNF approval is pending for SNF. Dtr states she does life [facility] but requested ALF move pt to a larger room. She is interested in pursuing SNF placement for rehab. States if pt does not get approval for SNF, that she wants pt to go back to [facility]. She has RW and wheelchair at ALF. States Tami Harris does dressing changes 2x per week and she takes pt to the wound care center 1x per week. NCM will continue to follow for dc needs. Will resumption of care orders for [facility] RN with instructions for dressing changes if pt dc to [facility].    Expected Discharge Date:                  Expected Discharge Plan:  Skilled Nursing Facility  In-House Referral:  NA  Discharge planning Services  CM Consult  Post Acute Care Choice:  NA Choice offered to:  Parent  DME Arranged:  N/A DME Agency:  NA  HH Arranged:  NA HH Agency:  NA  Status of Service:  In process, will continue to follow  If discussed at Long Length of Stay Meetings, dates discussed:    Additional Comments:  Elliot Cousin, RN 04/19/2016, 6:19 PM

## 2016-04-19 NOTE — Clinical Social Work Note (Signed)
Clinical Social Work Assessment  Patient Details  Name: Tami Harris MRN: [id number] Date of Birth: 25-Jul-1924  Date of referral:  04/19/16               Reason for consult:  Discharge Planning                Permission sought to share information with:  Facility Sport and exercise psychologist, Family Supports Permission granted to share information::  Yes, Verbal Permission Granted  Name::     Electrical engineer::  SNFs  Relationship::  Daughter  Contact Information:     Housing/Transportation Living arrangements for the past 2 months:  [place] of Information:  Patient, Adult Children Patient Interpreter Needed:  None Criminal Activity/Legal Involvement Pertinent to Current Situation/Hospitalization:    Significant Relationships:  Adult Children Lives with:  Facility Resident Do you feel safe going back to the place where you live?  Yes Need for family participation in patient care:  Yes (Comment)  Care giving concerns:  The patient is from [facility]. The patient is aggregable for short term rehab at discharge. Patient would like to rebuild  her strength to return to ALF.   Social Worker assessment / plan:  CSW met with patient at beside to complete assessment. Patient was resting comfortably in bed. Patient's daughter was also at bedside. CSW explained PT recommendation for SNF placement. CSW explained SNF search and placement process to the patient and patient's daughter and answered their questions. Patient and Patient's daughter would like placement with [facility].   Employment status:  Retired Forensic scientist:  [employer] PT Recommendations:  [place] / Referral to community resources:  [place]  Patient/Family's Response to care: The patient appears happy with the care she is receiving in hospital and is appreciative of CSW assistance.  Patient/Family's Understanding of and Emotional Response  to Diagnosis, Current Treatment, and Prognosis:  The patient has a good understanding of why she was admitted. She understands the care plan and what she will need post discharge. Emotional Assessment Appearance:  Appears stated age Attitude/Demeanor/Rapport:   (Patient was appropriate.) Affect (typically observed):  Accepting, Calm, Appropriate Orientation:  Oriented to Self, Oriented to Place, Oriented to Situation Alcohol / Substance use:  Not Applicable Psych involvement (Current and /or in the community):  No (Comment)  Discharge Needs  Concerns to be addressed:  Discharge Planning Concerns Readmission within the last 30 days:  No Current discharge risk:  Physical Impairment Barriers to Discharge:  Continued Medical Work up   [employer], Short 04/19/2016, 1:51 PM

## 2016-04-19 NOTE — Progress Notes (Addendum)
[facility] PROGRESS NOTE  Tami Harris TKZ:[id number] DOB: [birth year] DOA: 04/18/2016 PCP: Angela Cox, MD  interim summary and HPI 82 y.o. female with medical history significant for nonhealing ischemic ulcer left great toe being seen after Roxan Hockey at [facility] wound care clinic, dementia presents to the emergency Department chief complaint of fall. Initial evaluation reveals right radial fracture (w/o displacement). Was evaluated to r/o potential causes of syncope; but no abnormalities seen and per reports from family and roommate at ALF; fall was secondary to mechanical fall.   Assessment/Plan: 1-mechanical fall: patient tripped at ALF and had mechanical fall -no abnormalities seen on telemetry  -no signs of infection -no orthostatic changes -received gentle hydration overnight and appears stable to be discharge for further rehabilitation -PT has seen her and recommend short term placement at SNF  2-Radial fracture: right hand -non displaced  -velcro splint in place -will need outpatient follow up with orthopedic surgery in 10 days (family interested in hand surgeon that has seen patient in the past; they will arrange visit) -continue PRN analgesics   3-Critical lower limb ischemia/nonhealing wound. -plan si to re-schedule and follow up after discharge with Dr. Allyson Sabal -continue doxycycline for now -no signs of overwhelmed infection  -continue wound care  4-dementia: -continue aricept, namenda and supportive care  5-physical deconditioning -will need SNF for short ter rehab before pursuing return to ALF  6-dehydration and poor oral intake -will continue gentle IVF's -patient encourage to increase Po intake.  Code Status: DNR Family Communication: daughter at bedside  Disposition Plan: patient will go for Short term rehabilitation at [facility]; SW aware and working on Clinical biochemist number. Patient otherwise stable for  discharge.   Consultants:  Dr. Allyson Sabal curbside by admitting team on admission (planning to re-schedule revascularization attempt as an outpatient)  Procedures:  See below for x-ray reports   Antibiotics:  Doxycycline    HPI/Subjective: Afebrile, no CP, no SOB. AAOX2 (intermittently); poor judgement and insight.  Objective: Vitals:   04/19/16 0402 04/19/16 1249  BP: (!) 159/59 (!) 128/41  Pulse: (!) 55 65  Resp: 18 17  Temp: 98.1 F (36.7 C) 99 F (37.2 C)    Intake/Output Summary (Last 24 hours) at 04/19/16 1632 Last data filed at 04/19/16 1250  Gross per 24 hour  Intake              870 ml  Output              300 ml  Net              570 ml   Filed Weights   04/19/16 0402  Weight: 64.6 kg (142 lb 8 oz)    Exam:   General:  Mild abrasions in her face; complaining of pain with movement and to touch on her right hand. No fever, negative orthostatic and in no distress.    Cardiovascular: S1 and S2, no rubs or gallops  Respiratory: CTA bilaterally  Abdomen: soft, NT, ND, positive BS  Musculoskeletal: no edema or cyanosis. Right hand with velcro splint in place  Data Reviewed: Basic Metabolic Panel:  Recent Labs Lab 04/18/16 0850 04/19/16 0253  NA 137 137  K 4.3 3.8  CL 106 106  CO2 25 25  GLUCOSE 113* 92  BUN 13 14  CREATININE 0.91 0.80  CALCIUM 9.0 8.6*   CBC:  Recent Labs Lab 04/18/16 0850 04/19/16 0253  WBC 12.4* 10.0  NEUTROABS 10.0*  --   HGB 11.9*  11.0*  HCT 37.5 35.1*  MCV 90.8 91.2  PLT 208 200   CBG:  Recent Labs Lab 04/19/16 0636  GLUCAP 97   Studies: Dg Chest 2 View  Result Date: 04/18/2016 CLINICAL DATA:  Fall today EXAM: CHEST  2 VIEW COMPARISON:  04/08/2016 FINDINGS: Large hiatal hernia. Heart is mildly enlarged. Scarring in the lingula. Right lung is clear. No effusions. No acute bony abnormality. IMPRESSION: Large hiatal hernia.  Lingular scarring.  No active disease. Electronically Signed   By: Charlett Nose M.D.    On: 04/18/2016 09:22  Dg Wrist Complete Right  Result Date: 04/18/2016 CLINICAL DATA:  Pain following fall EXAM: RIGHT WRIST - COMPLETE 3+ VIEW COMPARISON:  None. FINDINGS: Frontal, oblique, lateral, and ulnar deviation scaphoid images were obtained. There is a transversely oriented fracture of the distal radial metaphysis with dorsal angulation distally. No other fracture. No dislocation. Bones are somewhat osteoporotic. The joint spaces appear normal. There are foci of arterial calcification distally. IMPRESSION: Fracture distal radial metaphysis with dorsal angulation distally. Bones osteoporotic. No dislocation. Joint spaces appear intact in the wrist region. Electronically Signed   By: Bretta Bang III M.D.   On: 04/18/2016 09:21  Ct Head Wo Contrast  Result Date: 04/18/2016 CLINICAL DATA:  Fall.  Abrasion to forehead. EXAM: CT HEAD WITHOUT CONTRAST TECHNIQUE: Contiguous axial images were obtained from the base of the skull through the vertex without intravenous contrast. COMPARISON:  June 15, 2011 FINDINGS: Paranasal sinuses, mastoid air cells, bones, and extracranial soft tissues images. No subdural, epidural, or subarachnoid hemorrhage. Ventricles and sulci are prominent but stable. Cerebellum, brainstem, and basal cisterns are normal. No mass, mass effect, or midline shift. Scattered white matter changes with no evidence of acute cortical ischemia or infarct. IMPRESSION: Mild white matter changes.  No acute intracranial process. Electronically Signed   By: Gerome Sam III M.D   On: 04/18/2016 09:58  Dg Hand Complete Right  Result Date: 04/18/2016 CLINICAL DATA:  Pain following fall EXAM: RIGHT HAND - COMPLETE 3+ VIEW COMPARISON:  None. FINDINGS: Frontal, oblique, and lateral views were obtained. There is a fracture of the distal radial metaphysis with dorsal angulation distally. No other fracture. No dislocation. There is moderate narrowing of all DIP joints. There is milder  narrowing of all MCP and PIP joints. No erosive change. IMPRESSION: Fracture distal radial metaphysis with dorsal angulation distally. No other fracture. No dislocation. Multiple foci of osteoarthritic change, most notably in the distal joints. Electronically Signed   By: Bretta Bang III M.D.   On: 04/18/2016 09:20   Scheduled Meds: . donepezil  10 mg Oral QHS  . doxycycline  100 mg Oral Q12H  . sodium chloride flush  3 mL Intravenous Q12H   Continuous Infusions:   Principal Problem:   Syncope Active Problems:   Dementia   Critical lower limb ischemia   Fall   Distal radius fracture   PVD (peripheral vascular disease) (HCC)    Time spent: 30 minutes    Vassie Loll  [facility] Pager [phone]. If 7PM-7AM, please contact night-coverage at [email or web address], password [facility] 04/19/2016, 4:32 PM  LOS: 0 days

## 2016-04-19 NOTE — Clinical Social Work Placement (Signed)
   CLINICAL SOCIAL WORK PLACEMENT  NOTE  Date:  04/19/2016  Patient Details  Name: Tami Harris MRN: [id number] Date of Birth: [birth year]  Clinical Social Work is seeking post-discharge placement for this patient at the Skilled  Nursing Facility level of care (*CSW will initial, date and re-position this form in  chart as items are completed):  Yes   Patient/family provided with [place] Clinical Social Work Department's list of facilities offering this level of care within the geographic area requested by the patient (or if unable, by the patient's family).  Yes   Patient/family informed of their freedom to choose among providers that offer the needed level of care, that participate in Medicare, Medicaid or managed care program needed by the patient, have an available bed and are willing to accept the patient.  Yes   Patient/family informed of 's ownership interest in [facility] and [facility], as well as of the fact that they are under no obligation to receive care at these facilities.  PASRR submitted to EDS on 04/19/16     PASRR number received on 04/19/16     Existing PASRR number confirmed on       FL2 transmitted to all facilities in geographic area requested by pt/family on 04/19/16     FL2 transmitted to all facilities within larger geographic area on       Patient informed that his/her managed care company has contracts with or will negotiate with certain facilities, including the following:            Patient/family informed of bed offers received.  Patient chooses bed at       Physician recommends and patient chooses bed at      Patient to be transferred to   on  .  Patient to be transferred to facility by       Patient family notified on   of transfer.  Name of family member notified:        PHYSICIAN Please prepare priority discharge summary, including medications, Please prepare prescriptions, Please sign FL2, Please sign DNR      Additional Comment:    _______________________________________________ [name], LCSW 04/19/2016, 4:08 PM

## 2016-04-19 NOTE — Evaluation (Signed)
Physical Therapy Evaluation Patient Details Name: HALLEY OLIVAS MRN: [id number] DOB: 05/15/1924 Today's Date: 04/19/2016   History of Present Illness  Pt adm after fall at ALF and found to have rt wrist fx. Unsure if pt had syncopal episode. PMH - dementia, lt wrist fx, PVD, nonhealing wound on lt foot.  Clinical Impression  Pt admitted with above diagnosis and presents to PT with functional limitations due to deficits listed below (See PT problem list). Pt needs skilled PT to maximize independence and safety to allow discharge to ST-SNF prior to return to her ALF. Currently requiring assist for all mobility where as before this fall she has been mobilizing on her own.Feel that pt should progress relatively quickly at ST-SNF and be able to return to her more independent level at ALF at the conclusion of her stay.     Follow Up Recommendations SNF    Equipment Recommendations  None recommended by PT    Recommendations for Other Services       Precautions / Restrictions Precautions Precautions: Fall Required Braces or Orthoses: Other Brace/Splint Other Brace/Splint: rt wrist splint Restrictions Weight Bearing Restrictions: No      Mobility  Bed Mobility Overal bed mobility: Needs Assistance Bed Mobility: Sit to Supine       Sit to supine: Min assist   General bed mobility comments: Assist to bring legs back up into bed  Transfers Overall transfer level: Needs assistance Equipment used: 1 person hand held assist Transfers: Sit to/from [employer] Sit to Stand: Mod assist;Min assist Stand pivot transfers: Min assist       General transfer comment: Assist to bring hips up and for balance. More assist needed from low recliner. Assist for balance and support when pivoting to chair.  Ambulation/Gait Ambulation/Gait assistance: Min assist Ambulation Distance (Feet): 12 Feet Assistive device: 1 person hand held assist Gait Pattern/deviations:  Step-through pattern;Decreased step length - right;Decreased stance time - left;Decreased dorsiflexion - left;Antalgic Gait velocity: decr Gait velocity interpretation: <1.8 ft/sec, indicative of risk for recurrent falls General Gait Details: Assist for balance and support.   Stairs            Wheelchair Mobility    Modified Rankin (Stroke Patients Only)       Balance Overall balance assessment: Needs assistance Sitting-balance support: No upper extremity supported;Feet supported Sitting balance-Leahy Scale: Good     Standing balance support: Single extremity supported Standing balance-Leahy Scale: Poor Standing balance comment: UE support and min A for static standing                             Pertinent Vitals/Pain Pain Assessment: Faces Faces Pain Scale: Hurts little more Pain Location: rt wrist, back, lt foot Pain Descriptors / Indicators: Grimacing;Guarding Pain Intervention(s): Limited activity within patient's tolerance;Monitored during session;Repositioned    Home Living Family/patient expects to be discharged to:: Skilled nursing facility                 Additional Comments: Pt was at ALF     Prior Function Level of Independence: Needs assistance   Gait / Transfers Assistance Needed: Pt was modified independent at w/c level. Amb limited due to painful lt foot           Hand Dominance        Extremity/Trunk Assessment   Upper Extremity Assessment: RUE deficits/detail RUE Deficits / Details: Limited by wrist splint and soreness  Lower Extremity Assessment: Generalized weakness;LLE deficits/detail   LLE Deficits / Details: Limited due to painful lt foot     Communication   Communication: HOH  Cognition Arousal/Alertness: Awake/alert Behavior During Therapy: Flat affect Overall Cognitive Status: History of cognitive impairments - at baseline                      General Comments      Exercises         Assessment/Plan    PT Assessment Patient needs continued PT services  PT Diagnosis Difficulty walking;Generalized weakness;Acute pain   PT Problem List Decreased strength;Decreased activity tolerance;Decreased balance;Decreased mobility;Pain  PT Treatment Interventions DME instruction;Gait training;Functional mobility training;Therapeutic activities;Therapeutic exercise;Balance training;Patient/family education   PT Goals (Current goals can be found in the Care Plan section) Acute Rehab PT Goals Patient Stated Goal: return home PT Goal Formulation: With patient/family Time For Goal Achievement: 04/26/16 Potential to Achieve Goals: Good    Frequency Min 3X/week   Barriers to discharge Decreased caregiver support Pt at ALF    Co-evaluation               End of Session   Activity Tolerance: Patient tolerated treatment well Patient left: in bed;with call bell/phone within reach;with bed alarm set;with family/visitor present Nurse Communication: Mobility status;Other (comment) (Need for ST-SNF)         Time: 1200-1221 PT Time Calculation (min) (ACUTE ONLY): 21 min   Charges:   PT Evaluation $PT Eval Moderate Complexity: 1 Procedure     PT G Codes:        Tennis Mckinnon 05/09/16, 12:48 PM [facility] PT [phone]

## 2016-04-19 NOTE — [place] FL2 (Signed)
[place] MEDICAID FL2 LEVEL OF CARE SCREENING TOOL     IDENTIFICATION  Patient Name: Tami Harris Birthdate: Feb 05, 1924 Sex: female Admission Date (Current Location): 04/18/2016  [facility] and [place] Number:  Producer, television/film/video and Address:  The . [facility], [address] [place], [place] [address]      Provider Number: [id number]  Attending Physician Name and Address:  Vassie Loll, MD  Relative Name and Phone Number:       Current Level of Care: Hospital Recommended Level of Care: Skilled Nursing Facility Prior Approval Number:    Date Approved/Denied:   PASRR Number: [id number]  Discharge Plan: Home    Current Diagnoses: Patient Active Problem List   Diagnosis Date Noted  . Fall 04/18/2016  . Distal radius fracture 04/18/2016  . Syncope 04/18/2016  . PVD (peripheral vascular disease) (HCC)   . Critical lower limb ischemia 04/08/2016  . 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 Pneumonia 10/18/2011  . Dementia 10/18/2011    Orientation RESPIRATION BLADDER Height & Weight     Self, Situation, Place  Normal Continent Weight: 142 lb 8 oz (64.6 kg) Height:     BEHAVIORAL SYMPTOMS/MOOD NEUROLOGICAL BOWEL NUTRITION STATUS   (none)  (None) Continent Diet (Heart Healthy)  AMBULATORY STATUS COMMUNICATION OF NEEDS Skin   Limited Assist Verbally Surgical wounds (Open Wound: Left Foot)                       Personal Care Assistance Level of Assistance  Bathing, Feeding, Dressing Bathing Assistance: Limited assistance Feeding assistance: Independent Dressing Assistance: Limited assistance     Functional Limitations Info  Sight, Hearing, Speech Sight Info: Adequate Hearing Info: Adequate Speech Info: Adequate    SPECIAL CARE FACTORS FREQUENCY  PT (By licensed PT), OT (By licensed OT)     PT Frequency: 5/ week OT Frequency: 5/ week            Contractures Contractures Info: Not present    Additional Factors Info  Code  Status, Allergies Code Status Info: DNR Allergies Info: Other, Sulfa Antibiotics, Tape           Current Medications (04/19/2016):  This is the current hospital active medication list Current Facility-Administered Medications  Medication Dose Route Frequency Provider Last Rate Last Dose  . acetaminophen (TYLENOL) tablet 650 mg  650 mg Oral Q6H PRN Gwenyth Bender, NP       Or  . acetaminophen (TYLENOL) suppository 650 mg  650 mg Rectal Q6H PRN Gwenyth Bender, NP      . alum & mag hydroxide-simeth (MAALOX/MYLANTA) 200-200-20 MG/5ML suspension 30 mL  30 mL Oral Q6H PRN Gwenyth Bender, NP      . bisacodyl (DULCOLAX) suppository 10 mg  10 mg Rectal Daily PRN Gwenyth Bender, NP      . dextromethorphan (DELSYM) 30 MG/5ML liquid 30 mg  30 mg Oral Q8H PRN Gwenyth Bender, NP      . donepezil (ARICEPT) tablet 10 mg  10 mg Oral QHS Gwenyth Bender, NP   10 mg at 04/18/16 2119  . doxycycline (VIBRA-TABS) tablet 100 mg  100 mg Oral Q12H Lesle Chris Black, NP   100 mg at 04/19/16 1051  . HYDROcodone-acetaminophen (NORCO/VICODIN) 5-325 MG per tablet 1 tablet  1 tablet Oral Q4H PRN Gwenyth Bender, NP   1 tablet at 04/18/16 2118  . ondansetron (ZOFRAN) tablet 4 mg  4 mg Oral Q6H PRN Lesle Chris  Black, NP       Or  . ondansetron (ZOFRAN) injection 4 mg  4 mg Intravenous Q6H PRN Lesle Chris Black, NP      . sodium chloride flush (NS) 0.9 % injection 3 mL  3 mL Intravenous Q12H Lesle Chris Black, NP   3 mL at 04/19/16 1051  . traMADol (ULTRAM) tablet 50 mg  50 mg Oral Q8H PRN Gwenyth Bender, NP   50 mg at 04/18/16 1610     Discharge Medications: Please see discharge summary for a list of discharge medications.  Relevant Imaging Results:  Relevant Lab Results:   Additional Information SSN: [id number]  [name], LCSW

## 2016-04-20 DIAGNOSIS — I998 Other disorder of circulatory system: Secondary | ICD-10-CM

## 2016-04-20 DIAGNOSIS — S52501A Unspecified fracture of the lower end of right radius, initial encounter for closed fracture: Secondary | ICD-10-CM

## 2016-04-20 LAB — GLUCOSE, CAPILLARY: Glucose-Capillary: 96 mg/dL (ref 65–99)

## 2016-04-20 NOTE — Discharge Summary (Signed)
Physician Discharge Summary  Tami Harris TYO:[id number] DOB: [birth year] DOA: 04/18/2016  PCP: Angela Cox, MD  Admit date: 04/18/2016 Discharge date: 04/20/2016  Time spent: > 35 minutes  Recommendations for Outpatient Follow-up:  1. Please see discharge instructions below   Discharge Diagnoses:  Principal Problem:   Syncope Active Problems:   Dementia   Critical lower limb ischemia   Fall   Distal radius fracture   PVD (peripheral vascular disease) (HCC)   Discharge Condition: Stable  Diet recommendation: Heart healthy  Filed Weights   04/19/16 0402 04/20/16 0402  Weight: 64.6 kg (142 lb 8 oz) 65 kg (143 lb 4.8 oz)    History of present illness:  82 y.o. female with a Past Medical History of dementia, severe PVD, who presents with possible syncopal episode and R radial fracture  Hospital Course:  1-mechanical fall: patient tripped at ALF and had mechanical fall -no abnormalities seen on telemetry  -no signs of infection -no orthostatic changes -received gentle hydration overnight and appears stable to be discharge for further rehabilitation -PT has seen her and recommend short term placement at SNF  2-Radial fracture: right hand -non displaced  -velcro splint in place -will need outpatient follow up with orthopedic surgery in 10 days (family interested in hand surgeon that has seen patient in the past; they will arrange visit) -continue PRN analgesics   3-Critical lower limb ischemia/nonhealing wound. -plan si to re-schedule and follow up after discharge with Dr. Allyson Sabal -continue doxycycline for now -no signs of overwhelmed infection  -continue wound care  4-dementia: -continue aricept, namenda and supportive care  5-physical deconditioning -will need SNF for short ter rehab before pursuing return to ALF  6-dehydration and poor oral intake -improved after improvement in oral intake.  Discharge Exam: Vitals:   04/20/16 0402 04/20/16 0620   BP: (!) 173/62 (!) 149/68  Pulse: (!) 59 (!) 58  Resp: 18   Temp: 97.8 F (36.6 C) 98.3 F (36.8 C)    General: Pt in nad, alert and awake Cardiovascular: rrr, no rubs  Respiratory: no increased wob, no wheezes  Discharge Instructions   Discharge Instructions    Call MD for:  severe uncontrolled pain    Complete by:  As directed   Call MD for:  temperature >100.4    Complete by:  As directed   Diet - low sodium heart healthy    Complete by:  As directed   Discharge instructions    Complete by:  As directed   Please ensure patient follows up with Dr. Allyson Sabal for nonhealing wound of lower extremity. Otherwise follow-up with orthopedic surgeon,.   Increase activity slowly    Complete by:  As directed     Current Discharge Medication List    CONTINUE these medications which have NOT CHANGED   Details  acetaminophen (TYLENOL) 325 MG tablet Take 325 mg by mouth every 6 (six) hours as needed for mild pain or fever.     calcium carbonate (TUMS - DOSED IN MG ELEMENTAL CALCIUM) 500 MG chewable tablet Chew 1 tablet by mouth daily.    donepezil (ARICEPT) 10 MG tablet Take 1 tablet (10 mg total) by mouth at bedtime.    doxycycline (VIBRA-TABS) 100 MG tablet Take 100 mg by mouth 2 (two) times daily. FOR 5 WEEKS    memantine (NAMENDA) 10 MG tablet Take 1 tablet (10 mg total) by mouth 2 (two) times daily. Qty: 60 tablet, Refills: 3    Omega-3 Fatty Acids (FISH OIL)  1000 MG CAPS Take 1 capsule by mouth 2 (two) times daily.     multivitamin-lutein (OCUVITE-LUTEIN) CAPS Take 1 capsule by mouth daily.      STOP taking these medications     dextromethorphan (DELSYM) 30 MG/5ML liquid      Lutein 20 MG CAPS      mineral oil external liquid      traMADol (ULTRAM) 50 MG tablet        Allergies  Allergen Reactions  . Other Other (See Comments)    STRONG SMELLS: headaches and sinus issues  . Sulfa Antibiotics Other (See Comments)    Childhood allergy; reaction unknown  . Tape Other  (See Comments)    SKIN MAY TEAR      The results of significant diagnostics from this hospitalization (including imaging, microbiology, ancillary and laboratory) are listed below for reference.    Significant Diagnostic Studies: Dg Chest 2 View  Result Date: 04/18/2016 CLINICAL DATA:  Fall today EXAM: CHEST  2 VIEW COMPARISON:  04/08/2016 FINDINGS: Large hiatal hernia. Heart is mildly enlarged. Scarring in the lingula. Right lung is clear. No effusions. No acute bony abnormality. IMPRESSION: Large hiatal hernia.  Lingular scarring.  No active disease. Electronically Signed   By: Charlett Nose M.D.   On: 04/18/2016 09:22  Dg Chest 2 View  Result Date: 04/08/2016 CLINICAL DATA:  Preop. EXAM: CHEST  2 VIEW COMPARISON:  Radiograph of October 18, 2011. FINDINGS: The heart size and mediastinal contours are within normal limits. Both lungs are clear. Atherosclerosis thoracic aorta is noted. Large hiatal hernia is noted. No pneumothorax or pleural effusion is noted. The visualized skeletal structures are unremarkable. IMPRESSION: No active cardiopulmonary disease. Aortic atherosclerosis. Large hiatal hernia. Electronically Signed   By: Lupita Raider, M.D.   On: 04/08/2016 16:26   Dg Wrist Complete Right  Result Date: 04/18/2016 CLINICAL DATA:  Pain following fall EXAM: RIGHT WRIST - COMPLETE 3+ VIEW COMPARISON:  None. FINDINGS: Frontal, oblique, lateral, and ulnar deviation scaphoid images were obtained. There is a transversely oriented fracture of the distal radial metaphysis with dorsal angulation distally. No other fracture. No dislocation. Bones are somewhat osteoporotic. The joint spaces appear normal. There are foci of arterial calcification distally. IMPRESSION: Fracture distal radial metaphysis with dorsal angulation distally. Bones osteoporotic. No dislocation. Joint spaces appear intact in the wrist region. Electronically Signed   By: Bretta Bang III M.D.   On: 04/18/2016 09:21  Ct Head  Wo Contrast  Result Date: 04/18/2016 CLINICAL DATA:  Fall.  Abrasion to forehead. EXAM: CT HEAD WITHOUT CONTRAST TECHNIQUE: Contiguous axial images were obtained from the base of the skull through the vertex without intravenous contrast. COMPARISON:  June 15, 2011 FINDINGS: Paranasal sinuses, mastoid air cells, bones, and extracranial soft tissues images. No subdural, epidural, or subarachnoid hemorrhage. Ventricles and sulci are prominent but stable. Cerebellum, brainstem, and basal cisterns are normal. No mass, mass effect, or midline shift. Scattered white matter changes with no evidence of acute cortical ischemia or infarct. IMPRESSION: Mild white matter changes.  No acute intracranial process. Electronically Signed   By: Gerome Sam III M.D   On: 04/18/2016 09:58  Dg Hand Complete Right  Result Date: 04/18/2016 CLINICAL DATA:  Pain following fall EXAM: RIGHT HAND - COMPLETE 3+ VIEW COMPARISON:  None. FINDINGS: Frontal, oblique, and lateral views were obtained. There is a fracture of the distal radial metaphysis with dorsal angulation distally. No other fracture. No dislocation. There is moderate narrowing of all DIP  joints. There is milder narrowing of all MCP and PIP joints. No erosive change. IMPRESSION: Fracture distal radial metaphysis with dorsal angulation distally. No other fracture. No dislocation. Multiple foci of osteoarthritic change, most notably in the distal joints. Electronically Signed   By: Bretta Bang III M.D.   On: 04/18/2016 09:20   Microbiology: Recent Results (from the past 240 hour(s))  MRSA PCR Screening     Status: None   Collection Time: 04/19/16  4:42 PM  Result Value Ref Range Status   MRSA by PCR NEGATIVE NEGATIVE Final    Comment:        The GeneXpert MRSA Assay (FDA approved for NASAL specimens only), is one component of a comprehensive MRSA colonization surveillance program. It is not intended to diagnose MRSA infection nor to guide  or monitor treatment for MRSA infections.      Labs: Basic Metabolic Panel:  Recent Labs Lab 04/18/16 0850 04/19/16 0253  NA 137 137  K 4.3 3.8  CL 106 106  CO2 25 25  GLUCOSE 113* 92  BUN 13 14  CREATININE 0.91 0.80  CALCIUM 9.0 8.6*   Liver Function Tests: No results for input(s): AST, ALT, ALKPHOS, BILITOT, PROT, ALBUMIN in the last 168 hours. No results for input(s): LIPASE, AMYLASE in the last 168 hours. No results for input(s): AMMONIA in the last 168 hours. CBC:  Recent Labs Lab 04/18/16 0850 04/19/16 0253  WBC 12.4* 10.0  NEUTROABS 10.0*  --   HGB 11.9* 11.0*  HCT 37.5 35.1*  MCV 90.8 91.2  PLT 208 200   Cardiac Enzymes: No results for input(s): CKTOTAL, CKMB, CKMBINDEX, TROPONINI in the last 168 hours. BNP: BNP (last 3 results) No results for input(s): BNP in the last 8760 hours.  ProBNP (last 3 results) No results for input(s): PROBNP in the last 8760 hours.  CBG:  Recent Labs Lab 04/19/16 0636 04/20/16 0617  GLUCAP 97 96     Signed:  Penny Pia MD.  [facility] 04/20/2016, 12:02 PM

## 2016-04-20 NOTE — Clinical Social Work Placement (Signed)
   CLINICAL SOCIAL WORK PLACEMENT  NOTE  Date:  04/20/2016  Patient Details  Name: Tami Harris MRN: [id number] Date of Birth: 1924-08-26  Clinical Social Work is seeking post-discharge placement for this patient at the Skilled  Nursing Facility level of care (*CSW will initial, date and re-position this form in  chart as items are completed):  Yes   Patient/family provided with [facility] Clinical Social Work Department's list of facilities offering this level of care within the geographic area requested by the patient (or if unable, by the patient's family).  Yes   Patient/family informed of their freedom to choose among providers that offer the needed level of care, that participate in Medicare, Medicaid or managed care program needed by the patient, have an available bed and are willing to accept the patient.  Yes   Patient/family informed of [facility]'s ownership interest in [facility] and [facility], as well as of the fact that they are under no obligation to receive care at these facilities.  PASRR submitted to EDS on 04/19/16     PASRR number received on 04/19/16     Existing PASRR number confirmed on       FL2 transmitted to all facilities in geographic area requested by pt/family on 04/19/16     FL2 transmitted to all facilities within larger geographic area on       Patient informed that his/her managed care company has contracts with or will negotiate with certain facilities, including the following:        Yes   Patient/family informed of bed offers received.  Patient chooses bed at [facility]     Physician recommends and patient chooses bed at      Patient to be transferred to [facility] on 04/20/16.  Patient to be transferred to facility by daughter     Patient family notified on 04/20/16 of transfer.  Name of family member notified:  Tanseer,Jackie     PHYSICIAN Please prepare priority discharge summary,  including medications, Please prepare prescriptions, Please sign FL2, Please sign DNR     Additional Comment:  Per MD patient is ready to discharge to  [facility].  RN, patient, patient's family, and facility notified of discharge. RN given phone number for report and transport packet is on patient's chart. Patient's daughter reported she would provide transport for patient to [facility]. CSW signing off.   _______________________________________________ [name], LCSW 04/20/2016, 6:31 PM

## 2016-04-26 ENCOUNTER — Telehealth: Payer: Self-pay | Admitting: Cardiovascular Disease

## 2016-04-26 DIAGNOSIS — I70229 Atherosclerosis of native arteries of extremities with rest pain, unspecified extremity: Secondary | ICD-10-CM

## 2016-04-26 DIAGNOSIS — I998 Other disorder of circulatory system: Secondary | ICD-10-CM

## 2016-04-26 DIAGNOSIS — Z01810 Encounter for preprocedural cardiovascular examination: Secondary | ICD-10-CM

## 2016-04-26 DIAGNOSIS — D689 Coagulation defect, unspecified: Secondary | ICD-10-CM

## 2016-04-26 DIAGNOSIS — Z01818 Encounter for other preprocedural examination: Secondary | ICD-10-CM

## 2016-04-26 NOTE — Telephone Encounter (Signed)
Returned call to patient's DPR, Annice PihJackie. Annice PihJackie explained the patient had tripped during the night before the procedure over her wheelchair and therefore the procedure was cancelled. Since then, the pt has been moved to a larger room to accommodate the wheelchair.  Patient has seen a hand specialist, [facility] Mer[facility]uzma, [facility] [place].  Patient will f/u with Dr Roxan Hockeyobinson on 05/02/16. Annice PihJackie wants to reschedule the procedure for the patient whenever Dr Allyson SabalBerry feels he can procedure with it. Advised patient I will call her with details once I hear back from Dr Allyson SabalBerry.  Will route to Dr Allyson SabalBerry for advice.

## 2016-04-26 NOTE — Telephone Encounter (Signed)
New message   Pt daughter is calling to follow up with Dr.Berry since she has fallen and the surgery was canceled, pt daughter will like to have a call back and she wants to reschedule the Stent procedure   She did get the message about the 3 weeks opening around 05/16/16

## 2016-04-27 NOTE — Telephone Encounter (Signed)
Can resched PV angio when pt is able

## 2016-04-27 NOTE — Telephone Encounter (Signed)
No answer. Left message to call back.   

## 2016-04-28 ENCOUNTER — Other Ambulatory Visit: Payer: Self-pay | Admitting: Cardiovascular Disease

## 2016-04-28 DIAGNOSIS — I739 Peripheral vascular disease, unspecified: Secondary | ICD-10-CM

## 2016-04-28 NOTE — Telephone Encounter (Signed)
No answer. Left message to call back.   

## 2016-04-28 NOTE — Telephone Encounter (Signed)
Called patient's daughter. Discussed that I will get procedure scheduled and will call her with date and time.

## 2016-04-28 NOTE — Telephone Encounter (Signed)
Pre-procedural labs entered.

## 2016-04-28 NOTE — Telephone Encounter (Signed)
Called Tami PihJackie back and gave her the date and time of the procedure and to arrive at 0600am. We discussed for patient to have nothing to eat or drink past midnight except meds with a small sip of water.  We went over the letter from previously scheduled PV angio. She verbalized understanding. She is aware to have the patient get lab work within 14 days of the procedure.

## 2016-04-28 NOTE — Addendum Note (Signed)
Addended by: Stann MainlandLARK, Hugh Kamara O on: 04/28/2016 01:02 PM   Modules accepted: Orders

## 2016-04-28 NOTE — Telephone Encounter (Signed)
Called [facility]. Procedure scheduled for 05/16/16 at 0800 and for patient to arrive at 0600.

## 2016-04-29 NOTE — Progress Notes (Signed)
PT Note - Late G Code Entry    04/19/16 1252  PT G-Codes **NOT FOR INPATIENT CLASS**  Functional Assessment Tool Used clinical judgement  Functional Limitation Mobility: Walking and moving around  Mobility: Walking and Moving Around Current Status (W0981(G8978) CJ  Mobility: Walking and Moving Around Goal Status [phone](G8979) CI  [facility] Yenesis Even PT [phone]

## 2016-05-02 ENCOUNTER — Encounter (HOSPITAL_BASED_OUTPATIENT_CLINIC_OR_DEPARTMENT_OTHER): Payer: Medicare Other | Attending: Internal Medicine

## 2016-05-02 DIAGNOSIS — G40909 Epilepsy, unspecified, not intractable, without status epilepticus: Secondary | ICD-10-CM | POA: Insufficient documentation

## 2016-05-02 DIAGNOSIS — L97522 Non-pressure chronic ulcer of other part of left foot with fat layer exposed: Secondary | ICD-10-CM | POA: Insufficient documentation

## 2016-05-02 DIAGNOSIS — I70245 Atherosclerosis of native arteries of left leg with ulceration of other part of foot: Secondary | ICD-10-CM | POA: Insufficient documentation

## 2016-05-02 DIAGNOSIS — F0390 Unspecified dementia without behavioral disturbance: Secondary | ICD-10-CM | POA: Diagnosis not present

## 2016-05-02 DIAGNOSIS — N186 End stage renal disease: Secondary | ICD-10-CM | POA: Insufficient documentation

## 2016-05-05 ENCOUNTER — Encounter (HOSPITAL_COMMUNITY): Payer: Self-pay

## 2016-05-05 ENCOUNTER — Ambulatory Visit (HOSPITAL_COMMUNITY): Admission: RE | Admit: 2016-05-05 | Payer: Medicare Other | Source: Ambulatory Visit

## 2016-05-11 ENCOUNTER — Telehealth: Payer: Self-pay | Admitting: *Deleted

## 2016-05-11 NOTE — Telephone Encounter (Signed)
Called and spoke with Annice PihJackie, ok per DPR. Notified her that time of pv angiogram has changed to 05/16/16 at 0930 and to arrive at 0730. She verbalized understanding as well as stated she will be having the patient to get her lab work tomorrow.

## 2016-05-12 ENCOUNTER — Other Ambulatory Visit: Payer: Self-pay | Admitting: *Deleted

## 2016-05-12 DIAGNOSIS — I998 Other disorder of circulatory system: Secondary | ICD-10-CM

## 2016-05-12 DIAGNOSIS — I70229 Atherosclerosis of native arteries of extremities with rest pain, unspecified extremity: Secondary | ICD-10-CM

## 2016-05-12 LAB — CBC WITH DIFFERENTIAL/PLATELET
BASOS ABS: 0 {cells}/uL (ref 0–200)
Basophils Relative: 0 %
EOS PCT: 2 %
Eosinophils Absolute: 136 cells/uL (ref 15–500)
HCT: 43.1 % (ref 35.0–45.0)
HEMOGLOBIN: 13.9 g/dL (ref 11.7–15.5)
LYMPHS PCT: 20 %
Lymphs Abs: 1360 cells/uL (ref 850–3900)
MCH: 28.8 pg (ref 27.0–33.0)
MCHC: 32.3 g/dL (ref 32.0–36.0)
MCV: 89.4 fL (ref 80.0–100.0)
MONOS PCT: 9 %
MPV: 13.1 fL — ABNORMAL HIGH (ref 7.5–12.5)
Monocytes Absolute: 612 cells/uL (ref 200–950)
NEUTROS PCT: 69 %
Neutro Abs: 4692 cells/uL (ref 1500–7800)
PLATELETS: 234 10*3/uL (ref 140–400)
RBC: 4.82 MIL/uL (ref 3.80–5.10)
RDW: 14.7 % (ref 11.0–15.0)
WBC: 6.8 10*3/uL (ref 3.8–10.8)

## 2016-05-12 LAB — PROTIME-INR
INR: 1.1
PROTHROMBIN TIME: 11.2 s (ref 9.0–11.5)

## 2016-05-12 LAB — APTT: aPTT: 27 s (ref 22–34)

## 2016-05-13 LAB — BASIC METABOLIC PANEL
BUN: 19 mg/dL (ref 7–25)
CALCIUM: 9.4 mg/dL (ref 8.6–10.4)
CO2: 25 mmol/L (ref 20–31)
CREATININE: 0.91 mg/dL — AB (ref 0.60–0.88)
Chloride: 100 mmol/L (ref 98–110)
GLUCOSE: 109 mg/dL — AB (ref 65–99)
Potassium: 4.2 mmol/L (ref 3.5–5.3)
Sodium: 140 mmol/L (ref 135–146)

## 2016-05-16 ENCOUNTER — Ambulatory Visit (HOSPITAL_COMMUNITY)
Admission: RE | Admit: 2016-05-16 | Discharge: 2016-05-16 | Disposition: A | Discharge status: Home / Self Care | Payer: Medicare Other | Source: Ambulatory Visit | Attending: Cardiovascular Disease | Admitting: Cardiovascular Disease

## 2016-05-16 ENCOUNTER — Encounter (HOSPITAL_COMMUNITY)
Admission: RE | Disposition: A | Discharge status: Home / Self Care | Payer: Self-pay | Source: Ambulatory Visit | Attending: Cardiovascular Disease

## 2016-05-16 DIAGNOSIS — I70245 Atherosclerosis of native arteries of left leg with ulceration of other part of foot: Secondary | ICD-10-CM | POA: Insufficient documentation

## 2016-05-16 DIAGNOSIS — L97529 Non-pressure chronic ulcer of other part of left foot with unspecified severity: Secondary | ICD-10-CM | POA: Diagnosis not present

## 2016-05-16 DIAGNOSIS — Z993 Dependence on wheelchair: Secondary | ICD-10-CM | POA: Insufficient documentation

## 2016-05-16 DIAGNOSIS — F0390 Unspecified dementia without behavioral disturbance: Secondary | ICD-10-CM | POA: Diagnosis not present

## 2016-05-16 DIAGNOSIS — I70213 Atherosclerosis of native arteries of extremities with intermittent claudication, bilateral legs: Secondary | ICD-10-CM | POA: Diagnosis not present

## 2016-05-16 DIAGNOSIS — I70229 Atherosclerosis of native arteries of extremities with rest pain, unspecified extremity: Secondary | ICD-10-CM | POA: Diagnosis present

## 2016-05-16 DIAGNOSIS — I998 Other disorder of circulatory system: Secondary | ICD-10-CM

## 2016-05-16 DIAGNOSIS — Z66 Do not resuscitate: Secondary | ICD-10-CM | POA: Insufficient documentation

## 2016-05-16 HISTORY — PX: PERIPHERAL VASCULAR CATHETERIZATION: SHX172C

## 2016-05-16 SURGERY — LOWER EXTREMITY ANGIOGRAPHY
Anesthesia: LOCAL

## 2016-05-16 MED ORDER — SODIUM CHLORIDE 0.9 % IV SOLN: INTRAVENOUS | Status: DC

## 2016-05-16 MED ORDER — SODIUM CHLORIDE 0.9 % WEIGHT BASED INFUSION: 3.0000 mL/kg/h | INTRAVENOUS | Status: DC

## 2016-05-16 MED ORDER — ACETAMINOPHEN 325 MG PO TABS: 650.0000 mg | ORAL_TABLET | ORAL | Status: DC | PRN

## 2016-05-16 MED ORDER — HEPARIN (PORCINE) IN NACL 2-0.9 UNIT/ML-% IJ SOLN
INTRAMUSCULAR | Status: AC
  Filled 2016-05-16: qty 1000

## 2016-05-16 MED ORDER — ASPIRIN EC 325 MG PO TBEC: 325.0000 mg | DELAYED_RELEASE_TABLET | Freq: Every day | ORAL | Status: DC

## 2016-05-16 MED ORDER — MIDAZOLAM HCL 2 MG/2ML IJ SOLN
INTRAMUSCULAR | Status: AC
  Filled 2016-05-16: qty 2

## 2016-05-16 MED ORDER — HEPARIN (PORCINE) IN NACL 2-0.9 UNIT/ML-% IJ SOLN
INTRAMUSCULAR | Status: DC | PRN
  Administered 2016-05-16: 1000 mL via INTRA_ARTERIAL

## 2016-05-16 MED ORDER — ASPIRIN 81 MG PO CHEW: 81.0000 mg | CHEWABLE_TABLET | ORAL | Status: DC

## 2016-05-16 MED ORDER — FENTANYL CITRATE (PF) 100 MCG/2ML IJ SOLN
INTRAMUSCULAR | Status: DC | PRN
  Administered 2016-05-16: 25 ug via INTRAVENOUS

## 2016-05-16 MED ORDER — ONDANSETRON HCL 4 MG/2ML IJ SOLN: 4.0000 mg | Freq: Four times a day (QID) | INTRAMUSCULAR | Status: DC | PRN

## 2016-05-16 MED ORDER — SODIUM CHLORIDE 0.9 % WEIGHT BASED INFUSION: 1.0000 mL/kg/h | INTRAVENOUS | Status: DC

## 2016-05-16 MED ORDER — SODIUM CHLORIDE 0.9% FLUSH: 3.0000 mL | INTRAVENOUS | Status: DC | PRN

## 2016-05-16 MED ORDER — LIDOCAINE HCL (PF) 1 % IJ SOLN
INTRAMUSCULAR | Status: AC
  Filled 2016-05-16: qty 30

## 2016-05-16 MED ORDER — LIDOCAINE HCL (PF) 1 % IJ SOLN
INTRAMUSCULAR | Status: DC | PRN
  Administered 2016-05-16: 10 mL via SUBCUTANEOUS

## 2016-05-16 MED ORDER — FENTANYL CITRATE (PF) 100 MCG/2ML IJ SOLN
INTRAMUSCULAR | Status: AC
  Filled 2016-05-16: qty 2

## 2016-05-16 MED ORDER — IODIXANOL 320 MG/ML IV SOLN
INTRAVENOUS | Status: DC | PRN
  Administered 2016-05-16: 90 mL via INTRAVENOUS

## 2016-05-16 MED ORDER — MIDAZOLAM HCL 2 MG/2ML IJ SOLN
INTRAMUSCULAR | Status: DC | PRN
  Administered 2016-05-16: 0.5 mg via INTRAVENOUS

## 2016-05-16 SURGICAL SUPPLY — 10 items
CATH ANGIO 5F PIGTAIL 65CM (CATHETERS) ×1 IMPLANT
KIT MICROINTRODUCER STIFF 5F (SHEATH) ×1 IMPLANT
KIT PV (KITS) ×2 IMPLANT
SHEATH PINNACLE 5F 10CM (SHEATH) ×2 IMPLANT
STOPCOCK MORSE 400PSI 3WAY (MISCELLANEOUS) ×1 IMPLANT
SYRINGE MEDRAD AVANTA MACH 7 (SYRINGE) ×1 IMPLANT
TRANSDUCER W/STOPCOCK (MISCELLANEOUS) ×2 IMPLANT
TRAY PV CATH (CUSTOM PROCEDURE TRAY) ×2 IMPLANT
TUBING CIL FLEX 10 FLL-RA (TUBING) ×1 IMPLANT
WIRE HITORQ VERSACORE ST 145CM (WIRE) ×1 IMPLANT

## 2016-05-16 NOTE — Progress Notes (Signed)
Site area: right groin   Site Prior to Removal:  Level 0   Pressure Applied For 15 MINUTES    Minutes Beginning at 1530  Manual:   Yes.    Patient Status During Pull:  stable  Post Pull Groin Site:  Level 0  Post Pull Instructions Given:  Yes.  to daughter  Post Pull Pulses Present:  Yes.    Dressing Applied:  Yes.    Comments:  Pt tolerated well

## 2016-05-16 NOTE — Discharge Instructions (Signed)

## 2016-05-16 NOTE — Interval H&P Note (Signed)
History and Physical Interval Note:  05/16/2016 9:38 AM  Tami Harris  has presented today for surgery, with the diagnosis of pvd  The various methods of treatment have been discussed with the patient and family. After consideration of risks, benefits and other options for treatment, the patient has consented to  Procedure(s): Lower Extremity Angiography (N/A) as a surgical intervention .  The patient's history has been reviewed, patient examined, no change in status, stable for surgery.  I have reviewed the patient's chart and labs.  Questions were answered to the patient's satisfaction.     Nanetta BattyBerry, Bayron Dalto

## 2016-05-16 NOTE — Progress Notes (Signed)
Site area: Right  Groin a 6 french arterial sheath was removed  Site Prior to Removal:  Level 0  Pressure Applied For 15 MINUTES    Bedrest Beginning at 1130a  Manual:   Yes.    Patient Status During Pull:  stable  Post Pull Groin Site:  Level 0  Post Pull Instructions Given:  Yes.    Post Pull Pulses Present:  Yes.    Dressing Applied:  Yes.    Comments:  VS remain stable during sheath pull.

## 2016-05-16 NOTE — H&P (Signed)
05/16/16 EVANNA WASHINTON   08/09/1924  [id number]  Primary Physician Ginette Otto, MD Primary Cardiologist: Runell Gess MD Roseanne Reno  HPI:  Mrs. Ambrosino is a 82 year old widowed Caucasian female mother of one daughter Annice Pih) who accompanies her today. She lives in assisted living at [facility]. She has no cardiac risk factors. She is a DO NOT RESUSCITATE. She developed a ischemic ulcer on her left great toe approximately 2 months ago and since that time she has been nonambulatory. She has seen Dr. Leanord Hawking at the wound care center on a weekly basis who has been debriding this. He also has painful. Dopplers performed in our office 03/08/16 right ABI 0.59 and a left appointment 35. She has occluded SFAs bilaterally. She does have dementia and is on medications for this. She is fairly somnolent in the office today. We talked about medical therapy which probably would ultimately lead to amputation significant pain until that time versus attempt at angiography in percutaneous revascularization. I do not think she has a femoropopliteal candidate given her age and comorbidities.         Current Outpatient Prescriptions  Medication Sig Dispense Refill  . acetaminophen (TYLENOL) 325 MG tablet Take 325 mg by mouth every 6 (six) hours as needed for mild pain.    . bacitracin ointment Apply 1 application topically See admin instructions. Apply to left toe bunion daily    . calcium carbonate (TUMS - DOSED IN MG ELEMENTAL CALCIUM) 500 MG chewable tablet Chew 1 tablet by mouth daily.    Marland Kitchen dextromethorphan (DELSYM) 30 MG/5ML liquid Take 15 mg by mouth as needed for cough.    . doxycycline (VIBRAMYCIN) 100 MG capsule   10  . memantine (NAMENDA) 10 MG tablet Take 1 tablet (10 mg total) by mouth 2 (two) times daily. 60 tablet 3  . multivitamin-lutein (OCUVITE-LUTEIN) CAPS Take 1 capsule by mouth daily.    Marland Kitchen omega-3 acid ethyl esters (LOVAZA) 1 G capsule Take 2  g by mouth 2 (two) times daily.    . Omega-3 Fatty Acids (FISH OIL) 1000 MG CAPS Take 1 capsule by mouth daily.    . traMADol (ULTRAM) 50 MG tablet   4  . donepezil (ARICEPT) 10 MG tablet Take 1 tablet (10 mg total) by mouth at bedtime.     No current facility-administered medications for this visit.         Allergies  Allergen Reactions  . Sulfa Antibiotics Other (See Comments)    unknown    Social History        Social History  . Marital Status: Widowed    Spouse Name: N/A  . Number of Children: N/A  . Years of Education: N/A      Occupational History  . Not on file.       Social History Main Topics  . Smoking status: Never Smoker   . Smokeless tobacco: Never Used  . Alcohol Use: No  . Drug Use: No  . Sexual Activity: No       Other Topics Concern  . Not on file      Social History Narrative   Lives at [facility] with a roommate.  Has 1 daughter.  Retired from a Public librarian.  Education: high school.     Review of Systems: General: negative for chills, fever, night sweats or weight changes.  Cardiovascular: negative for chest pain, dyspnea on exertion, edema, orthopnea, palpitations, paroxysmal nocturnal dyspnea or shortness of breath  Dermatological: negative for rash Respiratory: negative for cough or wheezing Urologic: negative for hematuria Abdominal: negative for nausea, vomiting, diarrhea, bright red blood per rectum, melena, or hematemesis Neurologic: negative for visual changes, syncope, or dizziness All other systems reviewed and are otherwise negative except as noted above.    Blood pressure 131/72, pulse 53, height 5\' 5"  (1.651 m), weight 145 lb (65.772 kg), SpO2 98 %.  General appearance: alert and no distress Neck: no adenopathy, no carotid bruit, no JVD, supple, symmetrical, trachea midline and thyroid not enlarged, symmetric, no tenderness/mass/nodules Lungs: clear to auscultation bilaterally Heart: regular  rate and rhythm, S1, S2 normal, no murmur, click, rub or gallop Extremities: There is an ischemic ulcer left great toe  EKG not performed today  ASSESSMENT AND PLAN:   Critical lower limb ischemia This reason was referred to me by Dr. Roxan [facility]obinson at [facility] for evaluation of critical limb ischemia. She's had nonhealing ulcer on the lateral aspect of her left great toe for the last 2 months. She has been nonambulatory since that time and has seen Dr. Leanord Hawkingobson at the wound care center for weekly debridement. Recent arterial Dopplers in our office performed 03/08/16 revealed right ABI 0.59 occluded right SFA in the midportion left ABI of 0.35 long segment occlusion of the left SFA. She is fairly somnolent today and has a history of dementia. Her quality of life is markedly diminished and she is now nonambulatory and wheelchair bound. We talked about the risks and benefits of the procedure with the patient's daughter and agreed to proceed for quality of life issues.    Runell GessJonathan J. Chelesa Weingartner, M.D., FACP, [facility], Earl LagosFAHA, [facility] [address] [place], [place]  [id number]  [phone] 05/16/2016 6:37 AM

## 2016-05-17 ENCOUNTER — Encounter (HOSPITAL_COMMUNITY): Payer: Self-pay | Admitting: Cardiovascular Disease

## 2016-05-19 DIAGNOSIS — I70245 Atherosclerosis of native arteries of left leg with ulceration of other part of foot: Secondary | ICD-10-CM | POA: Diagnosis not present

## 2016-06-02 ENCOUNTER — Encounter (HOSPITAL_BASED_OUTPATIENT_CLINIC_OR_DEPARTMENT_OTHER): Payer: Medicare Other | Attending: Internal Medicine

## 2016-06-02 DIAGNOSIS — N186 End stage renal disease: Secondary | ICD-10-CM | POA: Insufficient documentation

## 2016-06-02 DIAGNOSIS — G40909 Epilepsy, unspecified, not intractable, without status epilepticus: Secondary | ICD-10-CM | POA: Insufficient documentation

## 2016-06-02 DIAGNOSIS — L97522 Non-pressure chronic ulcer of other part of left foot with fat layer exposed: Secondary | ICD-10-CM | POA: Diagnosis not present

## 2016-06-02 DIAGNOSIS — F0390 Unspecified dementia without behavioral disturbance: Secondary | ICD-10-CM | POA: Insufficient documentation

## 2016-06-02 DIAGNOSIS — I70245 Atherosclerosis of native arteries of left leg with ulceration of other part of foot: Secondary | ICD-10-CM | POA: Insufficient documentation

## 2016-06-15 ENCOUNTER — Inpatient Hospital Stay (HOSPITAL_COMMUNITY)
Admission: EM | Admit: 2016-06-15 | Discharge: 2016-06-22 | DRG: 481 | Disposition: A | Discharge status: Skilled Nursing Facility | Payer: Medicare Other | Attending: Internal Medicine | Admitting: Internal Medicine

## 2016-06-15 ENCOUNTER — Encounter (HOSPITAL_COMMUNITY): Payer: Self-pay | Admitting: Emergency Medicine

## 2016-06-15 ENCOUNTER — Emergency Department (HOSPITAL_COMMUNITY): Payer: Medicare Other

## 2016-06-15 DIAGNOSIS — G309 Alzheimer's disease, unspecified: Secondary | ICD-10-CM | POA: Diagnosis present

## 2016-06-15 DIAGNOSIS — Z515 Encounter for palliative care: Secondary | ICD-10-CM

## 2016-06-15 DIAGNOSIS — S72001A Fracture of unspecified part of neck of right femur, initial encounter for closed fracture: Secondary | ICD-10-CM | POA: Diagnosis not present

## 2016-06-15 DIAGNOSIS — S72141A Displaced intertrochanteric fracture of right femur, initial encounter for closed fracture: Principal | ICD-10-CM | POA: Diagnosis present

## 2016-06-15 DIAGNOSIS — Z66 Do not resuscitate: Secondary | ICD-10-CM | POA: Diagnosis present

## 2016-06-15 DIAGNOSIS — L97529 Non-pressure chronic ulcer of other part of left foot with unspecified severity: Secondary | ICD-10-CM | POA: Diagnosis present

## 2016-06-15 DIAGNOSIS — T148XXA Other injury of unspecified body region, initial encounter: Secondary | ICD-10-CM

## 2016-06-15 DIAGNOSIS — I998 Other disorder of circulatory system: Secondary | ICD-10-CM | POA: Diagnosis present

## 2016-06-15 DIAGNOSIS — I70229 Atherosclerosis of native arteries of extremities with rest pain, unspecified extremity: Secondary | ICD-10-CM | POA: Diagnosis present

## 2016-06-15 DIAGNOSIS — Z9181 History of falling: Secondary | ICD-10-CM

## 2016-06-15 DIAGNOSIS — S72009A Fracture of unspecified part of neck of unspecified femur, initial encounter for closed fracture: Secondary | ICD-10-CM | POA: Diagnosis present

## 2016-06-15 DIAGNOSIS — N390 Urinary tract infection, site not specified: Secondary | ICD-10-CM | POA: Diagnosis present

## 2016-06-15 DIAGNOSIS — I739 Peripheral vascular disease, unspecified: Secondary | ICD-10-CM | POA: Diagnosis present

## 2016-06-15 DIAGNOSIS — R001 Bradycardia, unspecified: Secondary | ICD-10-CM | POA: Diagnosis present

## 2016-06-15 DIAGNOSIS — Z961 Presence of intraocular lens: Secondary | ICD-10-CM | POA: Diagnosis present

## 2016-06-15 DIAGNOSIS — F0390 Unspecified dementia without behavioral disturbance: Secondary | ICD-10-CM | POA: Diagnosis present

## 2016-06-15 DIAGNOSIS — Z79899 Other long term (current) drug therapy: Secondary | ICD-10-CM

## 2016-06-15 DIAGNOSIS — Z882 Allergy status to sulfonamides status: Secondary | ICD-10-CM

## 2016-06-15 DIAGNOSIS — Z419 Encounter for procedure for purposes other than remedying health state, unspecified: Secondary | ICD-10-CM

## 2016-06-15 DIAGNOSIS — Z91048 Other nonmedicinal substance allergy status: Secondary | ICD-10-CM

## 2016-06-15 DIAGNOSIS — Y92129 Unspecified place in nursing home as the place of occurrence of the external cause: Secondary | ICD-10-CM

## 2016-06-15 DIAGNOSIS — D72829 Elevated white blood cell count, unspecified: Secondary | ICD-10-CM

## 2016-06-15 DIAGNOSIS — W1830XA Fall on same level, unspecified, initial encounter: Secondary | ICD-10-CM | POA: Diagnosis present

## 2016-06-15 DIAGNOSIS — F0280 Dementia in other diseases classified elsewhere without behavioral disturbance: Secondary | ICD-10-CM | POA: Diagnosis present

## 2016-06-15 DIAGNOSIS — Z7189 Other specified counseling: Secondary | ICD-10-CM

## 2016-06-15 DIAGNOSIS — D62 Acute posthemorrhagic anemia: Secondary | ICD-10-CM | POA: Diagnosis not present

## 2016-06-15 DIAGNOSIS — Z9841 Cataract extraction status, right eye: Secondary | ICD-10-CM

## 2016-06-15 DIAGNOSIS — Z993 Dependence on wheelchair: Secondary | ICD-10-CM

## 2016-06-15 LAB — CBC WITH DIFFERENTIAL/PLATELET
Basophils Absolute: 0 10*3/uL (ref 0.0–0.1)
Basophils Relative: 0 %
Eosinophils Absolute: 0.1 10*3/uL (ref 0.0–0.7)
Eosinophils Relative: 1 %
HCT: 39.3 % (ref 36.0–46.0)
Hemoglobin: 12.7 g/dL (ref 12.0–15.0)
Lymphocytes Relative: 10 %
Lymphs Abs: 1.9 10*3/uL (ref 0.7–4.0)
MCH: 29.1 pg (ref 26.0–34.0)
MCHC: 32.3 g/dL (ref 30.0–36.0)
MCV: 89.9 fL (ref 78.0–100.0)
Monocytes Absolute: 1.7 10*3/uL — ABNORMAL HIGH (ref 0.1–1.0)
Monocytes Relative: 9 %
Neutro Abs: 14.9 10*3/uL — ABNORMAL HIGH (ref 1.7–7.7)
Neutrophils Relative %: 80 %
Platelets: 229 10*3/uL (ref 150–400)
RBC: 4.37 MIL/uL (ref 3.87–5.11)
RDW: 17 % — ABNORMAL HIGH (ref 11.5–15.5)
WBC: 18.6 10*3/uL — ABNORMAL HIGH (ref 4.0–10.5)

## 2016-06-15 LAB — BASIC METABOLIC PANEL
Anion gap: 7 (ref 5–15)
BUN: 24 mg/dL — ABNORMAL HIGH (ref 6–20)
CO2: 27 mmol/L (ref 22–32)
Calcium: 9.2 mg/dL (ref 8.9–10.3)
Chloride: 106 mmol/L (ref 101–111)
Creatinine, Ser: 0.85 mg/dL (ref 0.44–1.00)
GFR calc Af Amer: >60 mL/min (ref >60)
GFR calc non Af Amer: 59 mL/min — ABNORMAL LOW (ref >60)
Glucose, Bld: 129 mg/dL — ABNORMAL HIGH (ref 65–99)
Potassium: 4.7 mmol/L (ref 3.5–5.1)
Sodium: 140 mmol/L (ref 135–145)

## 2016-06-15 MED ORDER — FENTANYL CITRATE (PF) 100 MCG/2ML IJ SOLN
50.0000 ug | INTRAMUSCULAR | Status: AC | PRN
  Administered 2016-06-15 – 2016-06-16 (×2): 50 ug via INTRAVENOUS
  Filled 2016-06-15 (×2): qty 2

## 2016-06-15 MED ORDER — SODIUM CHLORIDE 0.9 % IV SOLN
INTRAVENOUS | Status: DC
Start: 2016-06-15 — End: 2016-06-16
  Administered 2016-06-16: 01:00:00 via INTRAVENOUS

## 2016-06-15 MED ORDER — ONDANSETRON HCL 4 MG/2ML IJ SOLN
4.0000 mg | Freq: Once | INTRAMUSCULAR | Status: AC
  Administered 2016-06-15: 4 mg via INTRAVENOUS
  Filled 2016-06-15: qty 2

## 2016-06-15 NOTE — ED Provider Notes (Signed)
[facility]-EMERGENCY DEPT Provider Note   CSN: [id number] Arrival date & time: 06/15/16  2122  By signing my name below, I, Octavia Heirrianna Nassar, attest that this documentation has been prepared under the direction and in the presence of Marlon Peliffany Jataya Wann, PA-C.  Electronically Signed: Octavia HeirArianna Nassar, ED Scribe. 06/15/16. 10:01 PM.    History   Chief Complaint Chief Complaint  Patient presents with  . Hip Pain  . Fall   LEVEL 5 CAVEAT DUE TO DEMENTIA  The history is provided by the EMS personnel and the nursing home. No language interpreter was used.   HPI Comments: Oswald HillockHilda B Mroczkowski is a 82 y.o. female brought in by ambulance, who has a PMhx of dementia and falls presents to the Emergency Department complaining of right hip pain s/p an unwitnessed fall that occurred this evening. [facility]. Per staff, pt was found sitting on the floor after an unwitnessed fall. She has no recollection of the fall. She is not on any blood thinners. There is a c-collar in place. Pt denies head pain or neck pain.  Past Medical History:  Diagnosis Date  . 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 Allergic rhinitis   . Alzheimer's dementia dx'd 2008  . Diverticulosis   . Fall   . Family history of adverse reaction to anesthesia    daughter reports "either faulty tube crimped or swelling from allergic reaction caused the tube to crimp"  . GERD (gastroesophageal reflux disease)   . Headache    "fragrant related"  . Hiatal hernia   . Memory loss   . Pneumonia 2013  . Radial fracture   . Seasonal allergies     Patient Active Problem List   Diagnosis Date Noted  . Fall 04/18/2016  . Distal radius fracture 04/18/2016  . Syncope 04/18/2016  . PVD (peripheral vascular disease) (HCC)   . Critical lower limb ischemia 04/08/2016  . 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 Pneumonia 10/18/2011  . Dementia 10/18/2011    Past Surgical  History:  Procedure Laterality Date  . BLADDER SUSPENSION  X 2  . CATARACT EXTRACTION W/ INTRAOCULAR LENS IMPLANT Right   . DILATION AND CURETTAGE OF UTERUS    . INGUINAL HERNIA REPAIR Right   . PERIPHERAL VASCULAR CATHETERIZATION N/A 05/16/2016   Procedure: Lower Extremity Angiography;  Surgeon: Runell GessJonathan J Berry, MD;  [facility];  Service: Cardiovascular;  Laterality: N/A;    OB History    No data available       Home Medications    Prior to Admission medications   Medication Sig Start Date End Date Taking? Authorizing Provider  acetaminophen (TYLENOL) 325 MG tablet Take 325 mg by mouth every 6 (six) hours as needed for mild pain or fever.    Yes Historical Provider, MD  calcium carbonate (TUMS - DOSED IN MG ELEMENTAL CALCIUM) 500 MG chewable tablet Chew 1 tablet by mouth daily.   Yes Historical Provider, MD  donepezil (ARICEPT) 10 MG tablet Take 1 tablet (10 mg total) by mouth at bedtime. 10/19/11 06/15/16 Yes Vassie Lollarlos Madera, MD  memantine (NAMENDA) 10 MG tablet Take 1 tablet (10 mg total) by mouth 2 (two) times daily. 03/11/16  Yes Donika Concha SeK Patel, DO  Multiple Vitamin (MULTIVITAMIN WITH MINERALS) TABS tablet Take 1 tablet by mouth daily.   Yes Historical Provider, MD  Omega-3 Fatty Acids (FISH OIL) 1000 MG CAPS Take 1 capsule by  mouth 2 (two) times daily.    Yes Historical Provider, MD  traMADol (ULTRAM) 50 MG tablet Take 50 mg by mouth every 6 (six) hours as needed for moderate pain.  04/05/16  Yes Historical Provider, MD    Family History Family History  Problem Relation Age of Onset  . Dementia Mother   . Heart failure Mother   . Heart failure Father   . Parkinsonism Brother     Social History Social History  Substance Use Topics  . Smoking status: Never Smoker  . Smokeless tobacco: Never Used  . Alcohol use No     Allergies   Other; Sulfa antibiotics; and Tape   Review of Systems Review of Systems  LEVEL 5 CAVEAT DUE TO DEMENTIA  Physical  Exam Updated Vital Signs BP 125/80 (BP Location: Left Arm)   Pulse 72   Temp 97.6 F (36.4 C)   Resp 16   SpO2 99%   Physical Exam  Constitutional: She is oriented to person, place, and time. She appears well-developed and well-nourished.  HENT:  Head: Normocephalic and atraumatic.  No scalp trauma  Eyes: EOM are normal.  Neck: Normal range of motion.  Pt in c-collar but after removal she does not have neck pain and has good ROM.  Pulmonary/Chest: Effort normal.  Abdominal: She exhibits no distension.  Musculoskeletal:  Shortening with external rotation of right leg. Chronic wound to left foot.  Neurological: She is alert and oriented to person, place, and time.  Psychiatric: She has a normal mood and affect.  Nursing note and vitals reviewed.    ED Treatments / Results  DIAGNOSTIC STUDIES: Oxygen Saturation is 96% on RA, adequate by my interpretation.  COORDINATION OF CARE:  9:59 PM Discussed treatment plan with pt at bedside and pt agreed to plan.   Labs (all labs ordered are listed, but only abnormal results are displayed) Labs Reviewed  BASIC METABOLIC PANEL - Abnormal; Notable for the following:       Result Value   Glucose, Bld 129 (*)    BUN 24 (*)    GFR calc non Af Amer 59 (*)    All other components within normal limits  CBC WITH DIFFERENTIAL/PLATELET - Abnormal; Notable for the following:    WBC 18.6 (*)    RDW 17.0 (*)    Neutro Abs 14.9 (*)    Monocytes Absolute 1.7 (*)    All other components within normal limits  PROTIME-INR  TYPE AND SCREEN  ABO/RH    EKG  EKG Interpretation None       Radiology Ct Cervical Spine Wo Contrast  Result Date: 06/15/2016 CLINICAL DATA:  Status post fall, with concern for cervical spine injury. Initial encounter. EXAM: CT CERVICAL SPINE WITHOUT CONTRAST TECHNIQUE: Multidetector CT imaging of the cervical spine was performed without intravenous contrast. Multiplanar CT image reconstructions were also  generated. COMPARISON:  None. FINDINGS: Alignment: Normal. Skull base and vertebrae: No acute fracture. No primary bone lesion or focal pathologic process. Soft tissues and spinal canal: No prevertebral fluid or swelling. No visible canal hematoma. Disc levels: Mild multilevel disc space narrowing is noted along the mid to lower cervical spine, with small anterior and posterior disc osteophyte complexes. Upper chest: Scarring is noted at the lung apices. An accessory azygos lobe is seen. Mild calcification is noted at the carotid bifurcations bilaterally. The thyroid gland is unremarkable in appearance. Other: The visualized portions of the brain are unremarkable. IMPRESSION: 1. No evidence of fracture or subluxation  along the cervical spine. 2. Minimal degenerative change at the mid to lower cervical spine. 3. Scarring at the lung apices.  Accessory azygos lobe noted. 4. Mild calcification at the carotid bifurcations bilaterally. Carotid ultrasound could be considered for further evaluation, when and as deemed clinically appropriate. Electronically Signed   By: Roanna Raider M.D.   On: 06/15/2016 23:29   Dg Hip Unilat With Pelvis 2-3 Views Right  Result Date: 06/15/2016 CLINICAL DATA:  Status post unwitnessed fall, with right hip pain. Initial encounter. EXAM: DG HIP (WITH OR WITHOUT PELVIS) 2-3V RIGHT COMPARISON:  None. FINDINGS: There is a comminuted right femoral intertrochanteric fracture, with a displaced lesser trochanteric fragment, and mild medial and inferior displacement of the distal femur. The right femoral head remains seated at the acetabulum. The left hip joint is grossly unremarkable in appearance. The sacroiliac joints are grossly unremarkable. Mild degenerative change is noted at the lower lumbar spine. The visualized bowel gas pattern is grossly unremarkable. IMPRESSION: Comminuted right femoral intertrochanteric fracture, with a displaced lesser trochanteric fragment, and mild medial and  inferior displacement of the distal femur. Electronically Signed   By: Roanna Raider M.D.   On: 06/15/2016 23:31    Procedures Procedures (including critical care time)  Medications Ordered in ED Medications  0.9 %  sodium chloride infusion ( Intravenous New Bag/Given 06/16/16 0032)  fentaNYL (SUBLIMAZE) injection 50 mcg (50 mcg Intravenous Given 06/16/16 0257)  ondansetron (ZOFRAN) injection 4 mg (4 mg Intravenous Given 06/15/16 2350)  fentaNYL (SUBLIMAZE) injection 50 mcg (50 mcg Intravenous Given 06/16/16 0119)     Initial Impression / Assessment and Plan / ED Course  I have reviewed the triage vital signs and the nursing notes.  Pertinent labs & imaging results that were available during my care of the patient were reviewed by me and considered in my medical decision making (see chart for details).  Clinical Course    Discussed case with Dr. Wandra Feinstein, who will consult on patient for the hip fracture. Dr. Toniann Fail will admit patient to inpatient [facility]. I will put in hip/femur fracture admit orders. Dr. Eulah Pont will see patient in the AM sometimes, pt does NOT need to be NPO.  I personally performed the services described in this documentation, which was scribed in my presence. The recorded information has been reviewed and is accurate.  Final Clinical Impressions(s) / ED Diagnoses   Final diagnoses:  Closed right hip fracture, initial encounter [facility])    New Prescriptions New Prescriptions   No medications on file     Marlon Pel, PA-C 06/16/16 0341    Benjiman Core, MD 06/16/16 1606

## 2016-06-15 NOTE — ED Triage Notes (Signed)
Pt brought in by EMS after having an [facility]  Pt was found sitting on floor  Pt has dementia  Pt has no recollection of the fall  Pt is c/o right hip pain  Negative stroke screen per EMS  Pt is not on blood thinners  Pt placed in c collar as could not be cleared for spinal injury

## 2016-06-16 ENCOUNTER — Encounter (HOSPITAL_COMMUNITY): Payer: Self-pay | Admitting: Internal Medicine

## 2016-06-16 ENCOUNTER — Inpatient Hospital Stay (HOSPITAL_COMMUNITY): Payer: Medicare Other

## 2016-06-16 ENCOUNTER — Emergency Department (HOSPITAL_COMMUNITY): Payer: Medicare Other

## 2016-06-16 DIAGNOSIS — F0390 Unspecified dementia without behavioral disturbance: Secondary | ICD-10-CM | POA: Diagnosis not present

## 2016-06-16 DIAGNOSIS — Y92129 Unspecified place in nursing home as the place of occurrence of the external cause: Secondary | ICD-10-CM | POA: Diagnosis not present

## 2016-06-16 DIAGNOSIS — Z882 Allergy status to sulfonamides status: Secondary | ICD-10-CM | POA: Diagnosis not present

## 2016-06-16 DIAGNOSIS — S72001A Fracture of unspecified part of neck of right femur, initial encounter for closed fracture: Secondary | ICD-10-CM | POA: Diagnosis present

## 2016-06-16 DIAGNOSIS — Z7189 Other specified counseling: Secondary | ICD-10-CM | POA: Diagnosis not present

## 2016-06-16 DIAGNOSIS — Z515 Encounter for palliative care: Secondary | ICD-10-CM | POA: Diagnosis present

## 2016-06-16 DIAGNOSIS — Z961 Presence of intraocular lens: Secondary | ICD-10-CM | POA: Diagnosis present

## 2016-06-16 DIAGNOSIS — F0280 Dementia in other diseases classified elsewhere without behavioral disturbance: Secondary | ICD-10-CM | POA: Diagnosis not present

## 2016-06-16 DIAGNOSIS — S72141A Displaced intertrochanteric fracture of right femur, initial encounter for closed fracture: Secondary | ICD-10-CM | POA: Diagnosis present

## 2016-06-16 DIAGNOSIS — L97529 Non-pressure chronic ulcer of other part of left foot with unspecified severity: Secondary | ICD-10-CM | POA: Diagnosis present

## 2016-06-16 DIAGNOSIS — S72009A Fracture of unspecified part of neck of unspecified femur, initial encounter for closed fracture: Secondary | ICD-10-CM | POA: Diagnosis present

## 2016-06-16 DIAGNOSIS — W1830XA Fall on same level, unspecified, initial encounter: Secondary | ICD-10-CM | POA: Diagnosis present

## 2016-06-16 DIAGNOSIS — R001 Bradycardia, unspecified: Secondary | ICD-10-CM | POA: Diagnosis present

## 2016-06-16 DIAGNOSIS — Z79899 Other long term (current) drug therapy: Secondary | ICD-10-CM | POA: Diagnosis not present

## 2016-06-16 DIAGNOSIS — Z91048 Other nonmedicinal substance allergy status: Secondary | ICD-10-CM | POA: Diagnosis not present

## 2016-06-16 DIAGNOSIS — S72001S Fracture of unspecified part of neck of right femur, sequela: Secondary | ICD-10-CM

## 2016-06-16 DIAGNOSIS — I998 Other disorder of circulatory system: Secondary | ICD-10-CM | POA: Diagnosis present

## 2016-06-16 DIAGNOSIS — I739 Peripheral vascular disease, unspecified: Secondary | ICD-10-CM | POA: Diagnosis present

## 2016-06-16 DIAGNOSIS — Z9181 History of falling: Secondary | ICD-10-CM | POA: Diagnosis not present

## 2016-06-16 DIAGNOSIS — G309 Alzheimer's disease, unspecified: Secondary | ICD-10-CM | POA: Diagnosis present

## 2016-06-16 DIAGNOSIS — Z993 Dependence on wheelchair: Secondary | ICD-10-CM | POA: Diagnosis not present

## 2016-06-16 DIAGNOSIS — Z66 Do not resuscitate: Secondary | ICD-10-CM | POA: Diagnosis present

## 2016-06-16 DIAGNOSIS — N390 Urinary tract infection, site not specified: Secondary | ICD-10-CM | POA: Diagnosis present

## 2016-06-16 DIAGNOSIS — Z789 Other specified health status: Secondary | ICD-10-CM | POA: Diagnosis not present

## 2016-06-16 DIAGNOSIS — Z9841 Cataract extraction status, right eye: Secondary | ICD-10-CM | POA: Diagnosis not present

## 2016-06-16 DIAGNOSIS — D62 Acute posthemorrhagic anemia: Secondary | ICD-10-CM | POA: Diagnosis not present

## 2016-06-16 LAB — URINALYSIS, ROUTINE W REFLEX MICROSCOPIC
BILIRUBIN URINE: NEGATIVE
Glucose, UA: NEGATIVE mg/dL
KETONES UR: NEGATIVE mg/dL
NITRITE: NEGATIVE
PROTEIN: NEGATIVE mg/dL
SPECIFIC GRAVITY, URINE: 1.028 (ref 1.005–1.030)
pH: 5.5 (ref 5.0–8.0)

## 2016-06-16 LAB — TYPE AND SCREEN
ABO/RH(D): A POS
ANTIBODY SCREEN: NEGATIVE

## 2016-06-16 LAB — CBC WITH DIFFERENTIAL/PLATELET
BASOS ABS: 0 10*3/uL (ref 0.0–0.1)
BASOS PCT: 0 %
EOS ABS: 0 10*3/uL (ref 0.0–0.7)
EOS PCT: 0 %
HCT: 32.6 % — ABNORMAL LOW (ref 36.0–46.0)
Hemoglobin: 10.7 g/dL — ABNORMAL LOW (ref 12.0–15.0)
LYMPHS ABS: 1.6 10*3/uL (ref 0.7–4.0)
Lymphocytes Relative: 13 %
MCH: 28.6 pg (ref 26.0–34.0)
MCHC: 32.8 g/dL (ref 30.0–36.0)
MCV: 87.2 fL (ref 78.0–100.0)
Monocytes Absolute: 1.5 10*3/uL — ABNORMAL HIGH (ref 0.1–1.0)
Monocytes Relative: 12 %
Neutro Abs: 9.5 10*3/uL — ABNORMAL HIGH (ref 1.7–7.7)
Neutrophils Relative %: 75 %
PLATELETS: 221 10*3/uL (ref 150–400)
RBC: 3.74 MIL/uL — AB (ref 3.87–5.11)
RDW: 16.8 % — ABNORMAL HIGH (ref 11.5–15.5)
WBC: 12.6 10*3/uL — AB (ref 4.0–10.5)

## 2016-06-16 LAB — URINE MICROSCOPIC-ADD ON

## 2016-06-16 LAB — COMPREHENSIVE METABOLIC PANEL
ALT: 14 U/L (ref 14–54)
AST: 20 U/L (ref 15–41)
Albumin: 3.1 g/dL — ABNORMAL LOW (ref 3.5–5.0)
Alkaline Phosphatase: 57 U/L (ref 38–126)
Anion gap: 5 (ref 5–15)
BUN: 19 mg/dL (ref 6–20)
CALCIUM: 8.8 mg/dL — AB (ref 8.9–10.3)
CHLORIDE: 104 mmol/L (ref 101–111)
CO2: 30 mmol/L (ref 22–32)
CREATININE: 0.76 mg/dL (ref 0.44–1.00)
Glucose, Bld: 133 mg/dL — ABNORMAL HIGH (ref 65–99)
Potassium: 4.7 mmol/L (ref 3.5–5.1)
SODIUM: 139 mmol/L (ref 135–145)
TOTAL PROTEIN: 5.6 g/dL — AB (ref 6.5–8.1)
Total Bilirubin: 0.6 mg/dL (ref 0.3–1.2)

## 2016-06-16 LAB — PROTIME-INR
INR: 0.95
PROTHROMBIN TIME: 12.7 s (ref 11.4–15.2)

## 2016-06-16 LAB — ABO/RH: ABO/RH(D): A POS

## 2016-06-16 LAB — MRSA PCR SCREENING: MRSA BY PCR: NEGATIVE

## 2016-06-16 MED ORDER — MEMANTINE HCL 10 MG PO TABS
10.0000 mg | ORAL_TABLET | Freq: Two times a day (BID) | ORAL | Status: DC
  Administered 2016-06-16 – 2016-06-22 (×13): 10 mg via ORAL
  Filled 2016-06-16 (×14): qty 1

## 2016-06-16 MED ORDER — CALCIUM CARBONATE ANTACID 500 MG PO CHEW
1.0000 | CHEWABLE_TABLET | Freq: Every day | ORAL | Status: DC
  Administered 2016-06-16 – 2016-06-22 (×6): 200 mg via ORAL
  Filled 2016-06-16 (×9): qty 1

## 2016-06-16 MED ORDER — SODIUM CHLORIDE 0.9 % IV SOLN
INTRAVENOUS | Status: AC
  Administered 2016-06-16 (×2): via INTRAVENOUS

## 2016-06-16 MED ORDER — SODIUM CHLORIDE 0.9 % IV SOLN: INTRAVENOUS | Status: DC

## 2016-06-16 MED ORDER — FENTANYL CITRATE (PF) 100 MCG/2ML IJ SOLN
50.0000 ug | Freq: Once | INTRAMUSCULAR | Status: AC
  Administered 2016-06-16: 50 ug via INTRAVENOUS
  Filled 2016-06-16: qty 2

## 2016-06-16 MED ORDER — MORPHINE SULFATE (PF) 2 MG/ML IV SOLN
0.5000 mg | INTRAVENOUS | Status: DC | PRN
  Administered 2016-06-17 – 2016-06-20 (×7): 0.5 mg via INTRAVENOUS
  Filled 2016-06-16 (×8): qty 1

## 2016-06-16 MED ORDER — HYDROCODONE-ACETAMINOPHEN 5-325 MG PO TABS
1.0000 | ORAL_TABLET | ORAL | Status: DC | PRN
Start: 2016-06-16 — End: 2016-06-16

## 2016-06-16 MED ORDER — ADULT MULTIVITAMIN W/MINERALS CH
1.0000 | ORAL_TABLET | Freq: Every day | ORAL | Status: DC
  Administered 2016-06-16 – 2016-06-22 (×6): 1 via ORAL
  Filled 2016-06-16 (×7): qty 1

## 2016-06-16 MED ORDER — DONEPEZIL HCL 10 MG PO TABS
10.0000 mg | ORAL_TABLET | Freq: Every day | ORAL | Status: DC
  Administered 2016-06-16 – 2016-06-21 (×6): 10 mg via ORAL
  Filled 2016-06-16 (×3): qty 1
  Filled 2016-06-16: qty 2
  Filled 2016-06-16 (×2): qty 1

## 2016-06-16 MED ORDER — OMEGA-3-ACID ETHYL ESTERS 1 G PO CAPS
1.0000 | ORAL_CAPSULE | Freq: Two times a day (BID) | ORAL | Status: DC
Start: 2016-06-16 — End: 2016-06-22
  Administered 2016-06-16 – 2016-06-22 (×12): 1 g via ORAL
  Filled 2016-06-16 (×14): qty 1

## 2016-06-16 MED ORDER — HYDROCODONE-ACETAMINOPHEN 5-325 MG PO TABS
1.0000 | ORAL_TABLET | Freq: Four times a day (QID) | ORAL | Status: DC | PRN
  Administered 2016-06-16 – 2016-06-17 (×4): 1 via ORAL
  Filled 2016-06-16 (×4): qty 1

## 2016-06-16 MED ORDER — ONDANSETRON HCL 4 MG/2ML IJ SOLN: 4.0000 mg | Freq: Three times a day (TID) | INTRAMUSCULAR | Status: DC | PRN

## 2016-06-16 NOTE — Progress Notes (Signed)
Initial Nutrition Assessment  DOCUMENTATION CODES:   Not applicable  INTERVENTION:   Encouraged adequate nutrition post-op. Encouraged a general healthful diet. Will continue to monitor for nutritional needs.  NUTRITION DIAGNOSIS:   Inadequate oral intake related to inability to eat as evidenced by NPO status.  GOAL:   Patient will meet greater than or equal to 90% of their needs  MONITOR:   Diet advancement, Skin, I & O's, Labs, Weight trends  REASON FOR ASSESSMENT:   Consult Hip fracture protocol  ASSESSMENT:   82 y.o. female with dementia and critical limb ischemia being managed conservatively was brought to the ER after patient had a fall at her living facility. Patient had an unwitnessed fall. Patient was complaining of right hip pain and x-rays done in the ER shows a right hip fracture.  Spoke with pt at bedside who reports feeling hungry. Pt reports having a good appetite PTA. Pt states she "can always eat." Pt's typical breakfast might include sausage, eggs, and bacon. Pt's typical lunch and dinner meals might consist of meat, vegetables, pasta, etc.  Pt concerned as to when she will be able to eat again. Discussed NPO diet as it relates to upcoming surgery. Briefly discussed importance of adequate nutrition post-op.  Pt reports UBW as somewhere between 135-150 #. Pt is unsure. Per chart, weight is currently stable.  Medications reviewed and include calcium carbonate, MVI with mins, Lovaza, and PRN Zofran. Labs reviewed and include low calcium (8.8) and elevated glucose (133). NFPE: Exam performed. No fat depletion, mild muscle depletion (temples), and no edema noted. Unable to assess legs due to hip fracture.  Diet Order:  Diet NPO time specified  Skin:  Reviewed, no issues  Last BM:  PTA  Height:   Ht Readings from Last 1 Encounters:  06/16/16 5\' 6"  (1.676 m)    Weight:   Wt Readings from Last 1 Encounters:  06/16/16 140 lb 14 oz (63.9 kg)     Ideal Body Weight:  59 kg  BMI:  Body mass index is 22.74 kg/m.  Estimated Nutritional Needs:   Kcal:  1600-1800  Protein:  70-85 grams  Fluid:  1.6-1.8 L/day  EDUCATION NEEDS:   No education needs identified at this time  Rosemarie AxKate Shamikia Linskey Dietetic Intern Pager Number: [phone]

## 2016-06-16 NOTE — Clinical Social Work Note (Signed)
Clinical Social Work Assessment  Patient Details  Name: Tami Harris MRN: [id number] Date of Birth: 1924/03/17  Date of referral:  06/16/16               Reason for consult:  Facility Placement                Permission sought to share information with:  Facility Industrial/product designerContact Representative Permission granted to share information::  Yes, Verbal Permission Granted  Name::        Agency::     Relationship::     Contact Information:     Housing/Transportation Living arrangements for the past 2 months:  Assisted Living Facility Source of Information:  Patient, Adult Children Patient Interpreter Needed:  None Criminal Activity/Legal Involvement Pertinent to Current Situation/Hospitalization:  No - Comment as needed Significant Relationships:  Adult Children Lives with:  Facility Resident Do you feel safe going back to the place where you live?  No Need for family participation in patient care:  Yes (Comment)  Care giving concerns:  CSW received consult for SNF placement.    Social Worker assessment / plan:  CSW spoke with patient & daughter, Tami Harris at bedside re: discharge planning. [facility], per ALF - patient was independent with ADLs but was wheelchair bound.   Employment status:  Retired Database administratornsurance information:  Managed Medicare PT Recommendations:  Not assessed at this time Information / Referral to community resources:  Skilled Nursing Facility  Patient/Family's Response to care:  Patient's daughter informed CSW that patient had been to [facility] following wrist fracture on 04/20/16.   Patient/Family's Understanding of and Emotional Response to Diagnosis, Current Treatment, and Prognosis:  Patient's daughter is concerned about patient not coming out of surgery, but is realistic with the situation.   Emotional Assessment Appearance:  Appears younger than stated age Attitude/Demeanor/Rapport:    Affect (typically observed):     Orientation:    Alcohol / Substance use:    Psych involvement (Current and /or in the community):     Discharge Needs  Concerns to be addressed:    Readmission within the last 30 days:    Current discharge risk:    Barriers to Discharge:      Arlyss RepressHarrison, Oluwadara Gorman F, LCSW 06/16/2016, 11:14 AM

## 2016-06-16 NOTE — ED Notes (Signed)
No respiratory or acute distress noted resting in bed with eyes closed no reaction to medication noted call light in reach. 

## 2016-06-16 NOTE — Consult Note (Signed)
WOC Nurse wound consult note Reason for Consult: foot wound Wound type: Full thickness Pressure Ulcer POA: No, not a pressure wound Measurement:0.5cm x 1.5cm x 0.1cm Wound bed: 100 % scabbed, very dry area Drainage (amount, consistency, odor) no drainage, no odor Periwound: dry and intact Dressing procedure/placement/frequency: I have provided nurses with orders for Cleanse with NS, pat dry, apply wound hydrogel and cover with NS moistened gauze, dry gauze, wrap with Kling, perform daily. We will not follow, but will remain available to this patient, to nursing, and the medical and/or surgical teams.  Please re-consult if we need to assist further.    Barnett HatterMelinda Aaidyn San, RN-C, WTA-C Wound Treatment Associate

## 2016-06-16 NOTE — ED Notes (Signed)
No respiratory or acute distress noted alert and talking but confused call light in reach family at bedside no reaction to medication noted.

## 2016-06-16 NOTE — H&P (Addendum)
History and Physical    Tami Harris:[id number] DOB: [birth year] DOA: 06/15/2016  PCP: Angela Cox, MD  Patient coming from: Nursing home.  Chief Complaint: Fall.  HPI: DEROTHA FISHBAUGH is a 82 y.o. female with dementia and critical limb ischemia being managed conservatively was brought to the ER after patient had a fall at her living facility. Patient had an unwitnessed fall. Patient was complaining of right hip pain and x-rays done in the ER shows a right hip fracture. On-call orthopedic surgeon Dr. Eulah Pont has been consulted and patient is being admitted for further management. On my exam patient is not in distress and denies any chest pain or shortness of breath.   ED Course: CT head neck are unremarkable. X-rays of the hip show right hip fracture.  Review of Systems: As per HPI, rest all negative.   Past Medical History:  Diagnosis Date  . 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 Allergic rhinitis   . Alzheimer's dementia dx'd 2008  . Diverticulosis   . Fall   . Family history of adverse reaction to anesthesia    daughter reports "either faulty tube crimped or swelling from allergic reaction caused the tube to crimp"  . GERD (gastroesophageal reflux disease)   . Headache    "fragrant related"  . Hiatal hernia   . Memory loss   . Pneumonia 2013  . Radial fracture   . Seasonal allergies     Past Surgical History:  Procedure Laterality Date  . BLADDER SUSPENSION  X 2  . CATARACT EXTRACTION W/ INTRAOCULAR LENS IMPLANT Right   . DILATION AND CURETTAGE OF UTERUS    . INGUINAL HERNIA REPAIR Right   . PERIPHERAL VASCULAR CATHETERIZATION N/A 05/16/2016   Procedure: Lower Extremity Angiography;  Surgeon: Runell Gess, MD;  Location: [facility] INVASIVE CV LAB;  Service: Cardiovascular;  Laterality: N/A;     reports that she has never smoked. She has never used smokeless tobacco. She reports that she does not drink  alcohol or use drugs.  Allergies  Allergen Reactions  . Other Other (See Comments)    STRONG SMELLS: headaches and sinus issues  . Sulfa Antibiotics Other (See Comments)    Childhood allergy; reaction unknown  . Tape Other (See Comments)    SKIN MAY TEAR    Family History  Problem Relation Age of Onset  . Dementia Mother   . Heart failure Mother   . Heart failure Father   . Parkinsonism Brother     Prior to Admission medications   Medication Sig Start Date End Date Taking? Authorizing Provider  acetaminophen (TYLENOL) 325 MG tablet Take 325 mg by mouth every 6 (six) hours as needed for mild pain or fever.    Yes Historical Provider, MD  calcium carbonate (TUMS - DOSED IN MG ELEMENTAL CALCIUM) 500 MG chewable tablet Chew 1 tablet by mouth daily.   Yes Historical Provider, MD  donepezil (ARICEPT) 10 MG tablet Take 1 tablet (10 mg total) by mouth at bedtime. 10/19/11 06/15/16 Yes Vassie Loll, MD  memantine (NAMENDA) 10 MG tablet Take 1 tablet (10 mg total) by mouth 2 (two) times daily. 03/11/16  Yes Donika Concha Se, DO  Multiple Vitamin (MULTIVITAMIN WITH MINERALS) TABS tablet Take 1 tablet by mouth daily.   Yes Historical Provider, MD  Omega-3 Fatty Acids (FISH OIL) 1000 MG CAPS Take 1 capsule by mouth 2 (two) times daily.  Yes Historical Provider, MD  traMADol (ULTRAM) 50 MG tablet Take 50 mg by mouth every 6 (six) hours as needed for moderate pain.  04/05/16  Yes Historical Provider, MD    Physical Exam: Vitals:   06/16/16 0001 06/16/16 0121 06/16/16 0246 06/16/16 0348  BP: 138/58 135/58 125/80 122/75  Pulse: 75 (!) 59 72 72  Resp: 16 16  16   Temp: 98.3 F (36.8 C) 97.6 F (36.4 C)  97.4 F (36.3 C)  TempSrc: Oral   Oral  SpO2: 97% 99% 99% 96%      Constitutional: Not in distress. Vitals:   06/16/16 0001 06/16/16 0121 06/16/16 0246 06/16/16 0348  BP: 138/58 135/58 125/80 122/75  Pulse: 75 (!) 59 72 72  Resp: 16 16  16   Temp: 98.3 F (36.8 C) 97.6 F (36.4 C)   97.4 F (36.3 C)  TempSrc: Oral   Oral  SpO2: 97% 99% 99% 96%   Eyes: Anicteric no pallor. ENMT: No discharge from the ears eyes nose or mouth. Neck: No mass felt. No neck rigidity. Respiratory: No rhonchi or crepitations. Cardiovascular: S1 and S2 heard. Abdomen: Soft nontender bowel sounds present. Musculoskeletal: Pain on moving right hip. Skin: No rash. Neurologic: Alert awake oriented to name and place. Moves all extremities. Psychiatric: Has dementia.   Labs on Admission: I have personally reviewed following labs and imaging studies  CBC:  Recent Labs Lab 06/15/16 2305  WBC 18.6*  NEUTROABS 14.9*  HGB 12.7  HCT 39.3  MCV 89.9  PLT 229   Basic Metabolic Panel:  Recent Labs Lab 06/15/16 2305  NA 140  K 4.7  CL 106  CO2 27  GLUCOSE 129*  BUN 24*  CREATININE 0.85  CALCIUM 9.2   GFR: CrCl cannot be calculated (Unknown ideal weight.). Liver Function Tests: No results for input(s): AST, ALT, ALKPHOS, BILITOT, PROT, ALBUMIN in the last 168 hours. No results for input(s): LIPASE, AMYLASE in the last 168 hours. No results for input(s): AMMONIA in the last 168 hours. Coagulation Profile:  Recent Labs Lab 06/15/16 2355  INR 0.95   Cardiac Enzymes: No results for input(s): CKTOTAL, CKMB, CKMBINDEX, TROPONINI in the last 168 hours. BNP (last 3 results) No results for input(s): PROBNP in the last 8760 hours. HbA1C: No results for input(s): HGBA1C in the last 72 hours. CBG: No results for input(s): GLUCAP in the last 168 hours. Lipid Profile: No results for input(s): CHOL, HDL, LDLCALC, TRIG, CHOLHDL, LDLDIRECT in the last 72 hours. Thyroid Function Tests: No results for input(s): TSH, T4TOTAL, FREET4, T3FREE, THYROIDAB in the last 72 hours. Anemia Panel: No results for input(s): VITAMINB12, FOLATE, FERRITIN, TIBC, IRON, RETICCTPCT in the last 72 hours. Urine analysis:    Component Value Date/Time   COLORURINE YELLOW 04/18/2016 1726   APPEARANCEUR  CLOUDY (A) 04/18/2016 1726   LABSPEC 1.023 04/18/2016 1726   PHURINE 6.5 04/18/2016 1726   GLUCOSEU NEGATIVE 04/18/2016 1726   HGBUR NEGATIVE 04/18/2016 1726   BILIRUBINUR NEGATIVE 04/18/2016 1726   KETONESUR NEGATIVE 04/18/2016 1726   PROTEINUR NEGATIVE 04/18/2016 1726   UROBILINOGEN 0.2 06/15/2011 1033   NITRITE NEGATIVE 04/18/2016 1726   LEUKOCYTESUR MODERATE (A) 04/18/2016 1726   Sepsis Labs: @LABRCNTIP (procalcitonin:4,lacticidven:4) )No results found for this or any previous visit (from the past 240 hour(s)).   Radiological Exams on Admission: Ct Head Wo Contrast  Result Date: 06/16/2016 CLINICAL DATA:  82 year old female with unwitnessed fall EXAM: CT HEAD WITHOUT CONTRAST TECHNIQUE: Contiguous axial images were obtained from the base of  the skull through the vertex without intravenous contrast. COMPARISON:  Head CT dated 04/18/2016 FINDINGS: Brain: There is mild age-related atrophy and chronic microvascular ischemic changes. There is no acute intracranial hemorrhage. No mass effect or midline shift noted. No extra-axial fluid collections. Vascular: No hyperdense vessel or unexpected calcification. Skull: Normal. Negative for fracture or focal lesion. Sinuses/Orbits: No acute finding. Other: None IMPRESSION: No acute intracranial hemorrhage. Mild age-related atrophy and chronic microvascular ischemic disease. If symptoms persist and there are no contraindications, MRI may provide better evaluation if clinically indicated Electronically Signed   By: Elgie Collard M.D.   On: 06/16/2016 05:20   Ct Cervical Spine Wo Contrast  Result Date: 06/15/2016 CLINICAL DATA:  Status post fall, with concern for cervical spine injury. Initial encounter. EXAM: CT CERVICAL SPINE WITHOUT CONTRAST TECHNIQUE: Multidetector CT imaging of the cervical spine was performed without intravenous contrast. Multiplanar CT image reconstructions were also generated. COMPARISON:  None. FINDINGS: Alignment: Normal.  Skull base and vertebrae: No acute fracture. No primary bone lesion or focal pathologic process. Soft tissues and spinal canal: No prevertebral fluid or swelling. No visible canal hematoma. Disc levels: Mild multilevel disc space narrowing is noted along the mid to lower cervical spine, with small anterior and posterior disc osteophyte complexes. Upper chest: Scarring is noted at the lung apices. An accessory azygos lobe is seen. Mild calcification is noted at the carotid bifurcations bilaterally. The thyroid gland is unremarkable in appearance. Other: The visualized portions of the brain are unremarkable. IMPRESSION: 1. No evidence of fracture or subluxation along the cervical spine. 2. Minimal degenerative change at the mid to lower cervical spine. 3. Scarring at the lung apices.  Accessory azygos lobe noted. 4. Mild calcification at the carotid bifurcations bilaterally. Carotid ultrasound could be considered for further evaluation, when and as deemed clinically appropriate. Electronically Signed   By: Roanna Raider M.D.   On: 06/15/2016 23:29   Dg Hip Unilat With Pelvis 2-3 Views Right  Result Date: 06/15/2016 CLINICAL DATA:  Status post unwitnessed fall, with right hip pain. Initial encounter. EXAM: DG HIP (WITH OR WITHOUT PELVIS) 2-3V RIGHT COMPARISON:  None. FINDINGS: There is a comminuted right femoral intertrochanteric fracture, with a displaced lesser trochanteric fragment, and mild medial and inferior displacement of the distal femur. The right femoral head remains seated at the acetabulum. The left hip joint is grossly unremarkable in appearance. The sacroiliac joints are grossly unremarkable. Mild degenerative change is noted at the lower lumbar spine. The visualized bowel gas pattern is grossly unremarkable. IMPRESSION: Comminuted right femoral intertrochanteric fracture, with a displaced lesser trochanteric fragment, and mild medial and inferior displacement of the distal femur. Electronically  Signed   By: Roanna Raider M.D.   On: 06/15/2016 23:31    EKG: Independently reviewed. Sinus bradycardia with rate around 55 bpm.  Assessment/Plan Principal Problem:   Closed right hip fracture (HCC) Active Problems:   Dementia   Critical lower limb ischemia   PVD (peripheral vascular disease) (HCC)   Hip fracture (HCC)    1. Right hip fracture - patient has had previous multiple falls. At this time orthopedic surgeon Dr. Eulah Pont has been consulted. Patient will be at moderate to high risk for surgery given the history of age and comorbidities. Orthopedic surgery is planning surgery on Friday, September 29. 2. History of dementia on Aricept and Namenda. Patient has mild bradycardia and if heart rate drops further may have to hold dementia medications. 3. Peripheral vascular disease with critical ischemia and  left foot ulceration - being managed conservatively. 4. Leukocytosis - probably reactionary. Check UA and chest x-ray..   DVT prophylaxis: SCDs. Code Status: DO NOT RESUSCITATE.  Family Communication: No family at the bedside.  Disposition Plan: Nursing home.  Consults called: Orthopedic surgery.  Admission status: Inpatient. Likely stay 3-4 days.    Eduard Clos MD [facility] Pager [phone].  If 7PM-7AM, please contact night-coverage [email or web address] Password [facility]  06/16/2016, 5:27 AM

## 2016-06-16 NOTE — Progress Notes (Signed)
[facility] arrived to [facility]. VSS. Patient with no new complaints. Daughter Tami Harris made aware of transfer.

## 2016-06-16 NOTE — Progress Notes (Signed)
[facility] to [facility] and [facility] also call and they stated they will pick patient up for transportation between 9pm and 10pm.

## 2016-06-16 NOTE — Progress Notes (Addendum)
Palliative medicine note: (no charge)  Attempted to contact family to schedule GOC meeting. No family at bedside. Called number listed in demographics for daughter- Tami Harris. Received no answer. Will continue to try and contact and will have PMT follow up tomorrow.  Ocie BobKasie Mahan, AGNP-C Palliative Medicine  Please call Palliative Medicine team phone with any questions [phone]. For individual providers please see AMION.  Addendum- I was able to speak with patient's daughter Tami Harris via telephone- please see consult note.

## 2016-06-16 NOTE — Progress Notes (Addendum)
Patient ID: Tami Harris, female   DOB: July 10, 1924, 82 y.o.   MRN: [id number]  Patient admitted after midnight. For details please refer to admission note done 06/16/2016.  82 year old female with past medical history significant for dementia, limb ischemia (has a small left foot non healing ulcer). Patient sustained fall at assisted living facility. She was subsequently found to have right femoral intertrochanteric fracture. Orthopedic surgery will see in consultation.  Assessment and plan:  Mechanical fall / right femoral intertrochanteric fracture - Plan for surgery tomorrow - Medically stable for surgery - Chest x-ray with no evidence of acute infectious process - Continue pain management efforts - Nothing by mouth after midnight  Leukocytosis / uncomplicated UTI - Urinalysis with small leukocytes. Her white blood cell count was 18 on the admission so would treat for UTI with Rocephin first dose today - Urine culture not collected at the time of the admission  Code status: DNR/DNI Family communication: spoke with pt daughter at the bedside this am  Tami Passeylma Christy Harris Tami Harris [phone]

## 2016-06-16 NOTE — Progress Notes (Signed)
PT Cancellation Note  Patient Details Name: Tami Harris MRN: [id number] DOB: 01/29/1924   Cancelled Treatment:    Reason Eval/Treat Not Completed: PT screened, no needs identified, will sign off . Per chart pt to have surgery. Ortho MD please reorder PT eval, once appropriate, with WB status and activity level recommendations. Thanks.    Rebeca AlertJannie Brynnlie Unterreiner, MPT Pager: [phone]

## 2016-06-16 NOTE — Progress Notes (Signed)
Spoke with daughter POA at bedside concerning discharge needs. POA states that pt is going back to SNF, CSW following.

## 2016-06-16 NOTE — ED Notes (Signed)
No respiratory or acute distress noted alert and confused call light in reach family at bedside no reaction to medication noted.

## 2016-06-16 NOTE — Consult Note (Signed)
Consultation Note Date: 06/16/2016   Patient Name: Tami Harris  DOB: [birth year]  MRN: [id number]  Age / Sex: 82 y.o., female  PCP: Merlene Laughter, MD Referring Physician: Robbie Lis, MD  Reason for Consultation: Establishing goals of care  HPI/Patient Profile: 82 y.o. female  with past medical history of PVD w/ limb ischemia, dementia and falls admitted on 06/15/2016 from assisted living with hip fracture d/t fall at assisted living [facility] She was recently discharged from the hospital on 04/20/16 for an admission for a radial fracture, also from a fall.  Clinical Assessment and Goals of Care: Met with patient and spoke briefly with daughter Kennyth Lose via telephone. Patient was pleasant, having some pain and requesting to eat. She is aware of her hip fracture and that surgery is scheduled for tomorrow. She was oriented to person and place, but not time (year or president). Per Kennyth Lose patient has stated before that "she is ready to go" and they are comfortable with the fact tha patient may not survive surgery. Kennyth Lose states her main goal for patient is to be pain free and she doesn't feel this is possible without surgery. We discussed the alternative to surgery would be comfort care and Kennyth Lose was open to discussing this more in person.   NEXT OF KIN- daughter Modena Nunnery    SUMMARY OF RECOMMENDATIONS -F/U meeting planned for tomorrow morning with PMT member preferably before surgery to discuss option of surgery vs. Comfort care -Kennyth Lose appears to be under the impression that main risk of surgery is death and that main goal of surgery is pain control. Will plan to discuss the possibilities and high likelihood of other complications including long term ventilation, ICU stay, pneumonia, infection and resulting decreased quality of life     Code Status/Advance Care  Planning:  DNR    Symptom Management:   Continue pain management regimen  Palliative Prophylaxis:   Frequent Pain Assessment  Additional Recommendations (Limitations, Scope, Preferences): Plan to discuss further at in person meeting tomorrow  Psycho-social/Spiritual:   Desire for further Chaplaincy support: No  Additional Recommendations:  Prognosis:   < 6 months d/t pt recent history of multiple falls now with hip fx at advanced age  Discharge Planning: To Be Determined      Primary Diagnoses: Present on Admission: . PVD (peripheral vascular disease) ([place]) . Dementia . Critical lower limb ischemia . Closed right hip fracture ([place]) . Hip fracture ([place])   I have reviewed the medical record, interviewed the patient and family, and examined the patient. The following aspects are pertinent.  Past Medical History:  Diagnosis Date  . 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 Allergic rhinitis   . Alzheimer's dementia dx'd 2008  . Diverticulosis   . Fall   . Family history of adverse reaction to anesthesia    daughter reports "either faulty tube crimped or swelling from allergic reaction caused the tube to crimp"  . GERD (gastroesophageal reflux  disease)   . Headache    "fragrant related"  . Hiatal hernia   . Memory loss   . Pneumonia 2013  . Radial fracture   . Seasonal allergies    Social History   Social History  . Marital status: Widowed    Spouse name: N/A  . Number of children: N/A  . Years of education: N/A   Social History Main Topics  . Smoking status: Never Smoker  . Smokeless tobacco: Never Used  . Alcohol use No  . Drug use: No  . Sexual activity: No   Other Topics Concern  . None   Social History Narrative   L[facility] with a roommate.  Has 1 daughter.  Retired from a Special educational needs teacher.  Education: high school.   Family History  Problem Relation Age of Onset  . Dementia  Mother   . Heart failure Mother   . Heart failure Father   . Parkinsonism Brother    Scheduled Meds: . calcium carbonate  1 tablet Oral Daily  . donepezil  10 mg Oral QHS  . memantine  10 mg Oral BID  . multivitamin with minerals  1 tablet Oral Daily  . omega-3 acid ethyl esters  1 capsule Oral BID   Continuous Infusions: . sodium chloride 50 mL/hr at 06/16/16 1513   PRN Meds:.HYDROcodone-acetaminophen, morphine injection Medications Prior to Admission:  Prior to Admission medications   Medication Sig Start Date End Date Taking? Authorizing Provider  acetaminophen (TYLENOL) 325 MG tablet Take 325 mg by mouth every 6 (six) hours as needed for mild pain or fever.    Yes Historical Provider, MD  calcium carbonate (TUMS - DOSED IN MG ELEMENTAL CALCIUM) 500 MG chewable tablet Chew 1 tablet by mouth daily.   Yes Historical Provider, MD  donepezil (ARICEPT) 10 MG tablet Take 1 tablet (10 mg total) by mouth at bedtime. 10/19/11 06/15/16 Yes Barton Dubois, MD  memantine (NAMENDA) 10 MG tablet Take 1 tablet (10 mg total) by mouth 2 (two) times daily. 03/11/16  Yes Donika Keith Rake, DO  Multiple Vitamin (MULTIVITAMIN WITH MINERALS) TABS tablet Take 1 tablet by mouth daily.   Yes Historical Provider, MD  Omega-3 Fatty Acids (FISH OIL) 1000 MG CAPS Take 1 capsule by mouth 2 (two) times daily.    Yes Historical Provider, MD  traMADol (ULTRAM) 50 MG tablet Take 50 mg by mouth every 6 (six) hours as needed for moderate pain.  04/05/16  Yes Historical Provider, MD   Allergies  Allergen Reactions  . Other Other (See Comments)    STRONG SMELLS: headaches and sinus issues  . Sulfa Antibiotics Other (See Comments)    Childhood allergy; reaction unknown  . Tape Other (See Comments)    SKIN MAY TEAR   Review of Systems  Unable to perform ROS: Dementia    Physical Exam  Constitutional:  frail  Cardiovascular: Normal rate and regular rhythm.   Pulmonary/Chest: Effort normal and breath sounds normal.   Abdominal: Soft. Bowel sounds are normal.  Musculoskeletal:  LE ROM limited d/t pain and hip fx  Neurological: She is alert.  Oriented to person and place, requesting food  Skin: Skin is warm and dry.  Psychiatric: She has a normal mood and affect. Her behavior is normal.    Vital Signs: BP (!) 143/62 (BP Location: Right Arm)   Pulse 70   Temp 97.4 F (36.3 C) (Oral)   Resp 16   Ht _0  (1.676 m)  Wt 63.9 kg (140 lb 14 oz)   SpO2 100%   BMI 22.74 kg/m  Pain Assessment: (P) NIPS   Pain Score: 0-No pain   SpO2: SpO2: 100 % O2 Device:SpO2: 100 % O2 Flow Rate: .O2 Flow Rate (L/min): 2 L/min  IO: Intake/output summary:  Intake/Output Summary (Last 24 hours) at 06/16/16 1517 Last data filed at 06/16/16 1341  Gross per 24 hour  Intake                0 ml  Output              650 ml  Net             -650 ml    LBM:   Baseline Weight: Weight: 63.9 kg (140 lb 14 oz) Most recent weight: Weight: 63.9 kg (140 lb 14 oz)     Palliative Assessment/Data: PPS: 30 %     Time In: 1430  Time Out: 1530 Time Total: 60 mins Greater than 50%  of this time was spent counseling and coordinating care related to the above assessment and plan.  Signed by: Mariana Kaufman, AGNP-C Palliative Medicine    Please contact Palliative Medicine Team phone at [phone] for questions and concerns.  For individual provider: See Shea Evans

## 2016-06-16 NOTE — Consult Note (Signed)
ORTHOPAEDIC CONSULTATION  REQUESTING PHYSICIAN: Davonna Belling, MD  Chief Complaint: Right hip pain  HPI: Tami Harris is a 82 y.o. female who complains of an unwitnessed fall 2 days ago. C/o R leg pain  Past Medical History:  Diagnosis Date  . Age-related macular degeneration, wet, left eye ([place])    "partially blind in that eye"  . Allergic rhinitis   . Alzheimer's dementia dx'd 2008  . Diverticulosis   . Fall   . Family history of adverse reaction to anesthesia    daughter reports "either faulty tube crimped or swelling from allergic reaction caused the tube to crimp"  . GERD (gastroesophageal reflux disease)   . Headache    "fragrant related"  . Hiatal hernia   . Memory loss   . Pneumonia 2013  . Radial fracture   . Seasonal allergies    Past Surgical History:  Procedure Laterality Date  . BLADDER SUSPENSION  X 2  . CATARACT EXTRACTION W/ INTRAOCULAR LENS IMPLANT Right   . DILATION AND CURETTAGE OF UTERUS    . INGUINAL HERNIA REPAIR Right   . PERIPHERAL VASCULAR CATHETERIZATION N/A 05/16/2016   Procedure: Lower Extremity Angiography;  Surgeon: Lorretta Harp, MD;  Location: [facility];  Service: Cardiovascular;  Laterality: N/A;   Social History   Social History  . Marital status: Widowed    Spouse name: N/A  . Number of children: N/A  . Years of education: N/A   Social History Main Topics  . Smoking status: Never Smoker  . Smokeless tobacco: Never Used  . Alcohol use No  . Drug use: No  . Sexual activity: No   Other Topics Concern  . None   Social History Narrative   Lives at [facility] with a roommate.  Has 1 daughter.  Retired from a Special educational needs teacher.  Education: high school.   Family History  Problem Relation Age of Onset  . Dementia Mother   . Heart failure Mother   . Heart failure Father   . Parkinsonism Brother    Allergies  Allergen Reactions  . Other Other (See Comments)    STRONG SMELLS: headaches and sinus  issues  . Sulfa Antibiotics Other (See Comments)    Childhood allergy; reaction unknown  . Tape Other (See Comments)    SKIN MAY TEAR   Prior to Admission medications   Medication Sig Start Date End Date Taking? Authorizing Provider  acetaminophen (TYLENOL) 325 MG tablet Take 325 mg by mouth every 6 (six) hours as needed for mild pain or fever.    Yes Historical Provider, MD  calcium carbonate (TUMS - DOSED IN MG ELEMENTAL CALCIUM) 500 MG chewable tablet Chew 1 tablet by mouth daily.   Yes Historical Provider, MD  donepezil (ARICEPT) 10 MG tablet Take 1 tablet (10 mg total) by mouth at bedtime. 10/19/11 06/15/16 Yes Barton Dubois, MD  memantine (NAMENDA) 10 MG tablet Take 1 tablet (10 mg total) by mouth 2 (two) times daily. 03/11/16  Yes Donika Keith Rake, DO  Multiple Vitamin (MULTIVITAMIN WITH MINERALS) TABS tablet Take 1 tablet by mouth daily.   Yes Historical Provider, MD  Omega-3 Fatty Acids (FISH OIL) 1000 MG CAPS Take 1 capsule by mouth 2 (two) times daily.    Yes Historical Provider, MD  traMADol (ULTRAM) 50 MG tablet Take 50 mg by mouth every 6 (six) hours as needed for moderate pain.  04/05/16  Yes Historical Provider, MD   Ct Cervical Spine Wo  Contrast  Result Date: 06/15/2016 CLINICAL DATA:  Status post fall, with concern for cervical spine injury. Initial encounter. EXAM: CT CERVICAL SPINE WITHOUT CONTRAST TECHNIQUE: Multidetector CT imaging of the cervical spine was performed without intravenous contrast. Multiplanar CT image reconstructions were also generated. COMPARISON:  None. FINDINGS: Alignment: Normal. Skull base and vertebrae: No acute fracture. No primary bone lesion or focal pathologic process. Soft tissues and spinal canal: No prevertebral fluid or swelling. No visible canal hematoma. Disc levels: Mild multilevel disc space narrowing is noted along the mid to lower cervical spine, with small anterior and posterior disc osteophyte complexes. Upper chest: Scarring is noted at the  lung apices. An accessory azygos lobe is seen. Mild calcification is noted at the carotid bifurcations bilaterally. The thyroid gland is unremarkable in appearance. Other: The visualized portions of the brain are unremarkable. IMPRESSION: 1. No evidence of fracture or subluxation along the cervical spine. 2. Minimal degenerative change at the mid to lower cervical spine. 3. Scarring at the lung apices.  Accessory azygos lobe noted. 4. Mild calcification at the carotid bifurcations bilaterally. Carotid ultrasound could be considered for further evaluation, when and as deemed clinically appropriate. Electronically Signed   By: Garald Balding M.D.   On: 06/15/2016 23:29   Dg Hip Unilat With Pelvis 2-3 Views Right  Result Date: 06/15/2016 CLINICAL DATA:  Status post unwitnessed fall, with right hip pain. Initial encounter. EXAM: DG HIP (WITH OR WITHOUT PELVIS) 2-3V RIGHT COMPARISON:  None. FINDINGS: There is a comminuted right femoral intertrochanteric fracture, with a displaced lesser trochanteric fragment, and mild medial and inferior displacement of the distal femur. The right femoral head remains seated at the acetabulum. The left hip joint is grossly unremarkable in appearance. The sacroiliac joints are grossly unremarkable. Mild degenerative change is noted at the lower lumbar spine. The visualized bowel gas pattern is grossly unremarkable. IMPRESSION: Comminuted right femoral intertrochanteric fracture, with a displaced lesser trochanteric fragment, and mild medial and inferior displacement of the distal femur. Electronically Signed   By: Garald Balding M.D.   On: 06/15/2016 23:31    Positive ROS: All other systems have been reviewed and were otherwise negative with the exception of those mentioned in the HPI and as above.  Labs cbc  Recent Labs  06/15/16 2305  WBC 18.6*  HGB 12.7  HCT 39.3  PLT 229    Labs inflam No results for input(s): CRP in the last 72 hours.  Invalid input(s):  ESR  Labs coag  Recent Labs  06/15/16 2355  INR 0.95     Recent Labs  06/15/16 2305  NA 140  K 4.7  CL 106  CO2 27  GLUCOSE 129*  BUN 24*  CREATININE 0.85  CALCIUM 9.2    Physical Exam: Vitals:   06/16/16 0121 06/16/16 0246  BP: 135/58 125/80  Pulse: (!) 59 72  Resp: 16   Temp: 97.6 F (36.4 C)    General: Alert, no acute distress Cardiovascular: No pedal edema Respiratory: No cyanosis, no use of accessory musculature GI: No organomegaly, abdomen is soft and non-tender Skin: No lesions in the area of chief complaint other than those listed below in MSK exam.  Neurologic: Sensation intact distally save for the below mentioned MSK exam Psychiatric: Patient is demented Lymphatic: No axillary or cervical lymphadenopathy  MUSCULOSKELETAL:  RLE: 1+pulse at ankle, skin benign Other extremities are atraumatic with painless ROM and NVI.  Assessment: R hip intertroch fracture  Plan: Plan for OR today for IM  nail. Family understands the Risks with her health status but would like to go forward with surgery.     Renette Butters, MD Cell [phone]   06/16/2016 3:39 AM

## 2016-06-17 ENCOUNTER — Inpatient Hospital Stay (HOSPITAL_COMMUNITY): Payer: Medicare Other

## 2016-06-17 ENCOUNTER — Inpatient Hospital Stay (HOSPITAL_COMMUNITY): Payer: Medicare Other | Admitting: Anesthesiology

## 2016-06-17 ENCOUNTER — Encounter (HOSPITAL_COMMUNITY)
Admission: EM | Disposition: A | Discharge status: Skilled Nursing Facility | Payer: Self-pay | Source: Home / Self Care | Attending: Internal Medicine

## 2016-06-17 DIAGNOSIS — F0390 Unspecified dementia without behavioral disturbance: Secondary | ICD-10-CM

## 2016-06-17 DIAGNOSIS — Z789 Other specified health status: Secondary | ICD-10-CM

## 2016-06-17 DIAGNOSIS — Z7189 Other specified counseling: Secondary | ICD-10-CM

## 2016-06-17 HISTORY — PX: FEMUR IM NAIL: SHX1597

## 2016-06-17 LAB — SURGICAL PCR SCREEN
MRSA, PCR: NEGATIVE
STAPHYLOCOCCUS AUREUS: NEGATIVE

## 2016-06-17 SURGERY — INSERTION, INTRAMEDULLARY ROD, FEMUR
Anesthesia: Monitor Anesthesia Care | Site: Hip | Laterality: Right

## 2016-06-17 MED ORDER — POVIDONE-IODINE 10 % EX SWAB
2.0000 | Freq: Once | CUTANEOUS | Status: DC
Start: 2016-06-17 — End: 2016-06-17

## 2016-06-17 MED ORDER — DOCUSATE SODIUM 100 MG PO CAPS
100.0000 mg | ORAL_CAPSULE | Freq: Two times a day (BID) | ORAL | Status: DC
  Administered 2016-06-17 – 2016-06-22 (×10): 100 mg via ORAL
  Filled 2016-06-17 (×10): qty 1

## 2016-06-17 MED ORDER — METHOCARBAMOL 1000 MG/10ML IJ SOLN
500.0000 mg | Freq: Once | INTRAVENOUS | Status: AC
  Administered 2016-06-17: 500 mg via INTRAVENOUS
  Filled 2016-06-17: qty 5

## 2016-06-17 MED ORDER — MENTHOL 3 MG MT LOZG: 1.0000 | LOZENGE | OROMUCOSAL | Status: DC | PRN

## 2016-06-17 MED ORDER — HYDROMORPHONE HCL 1 MG/ML IJ SOLN
1.0000 mg | Freq: Once | INTRAMUSCULAR | Status: AC
  Administered 2016-06-17: 1 mg via INTRAVENOUS
  Filled 2016-06-17: qty 1

## 2016-06-17 MED ORDER — WHITE PETROLATUM GEL
Status: AC
  Administered 2016-06-17: 06:00:00
  Filled 2016-06-17: qty 1

## 2016-06-17 MED ORDER — CEFAZOLIN SODIUM-DEXTROSE 2-4 GM/100ML-% IV SOLN
2.0000 g | INTRAVENOUS | Status: AC
  Administered 2016-06-17: 2 g via INTRAVENOUS
  Filled 2016-06-17 (×2): qty 100

## 2016-06-17 MED ORDER — CEFAZOLIN SODIUM-DEXTROSE 2-4 GM/100ML-% IV SOLN
2.0000 g | Freq: Four times a day (QID) | INTRAVENOUS | Status: AC
  Administered 2016-06-17 – 2016-06-18 (×2): 2 g via INTRAVENOUS
  Filled 2016-06-17 (×2): qty 100

## 2016-06-17 MED ORDER — BISACODYL 10 MG RE SUPP: 10.0000 mg | Freq: Every day | RECTAL | Status: DC | PRN

## 2016-06-17 MED ORDER — ONDANSETRON HCL 4 MG/2ML IJ SOLN: 4.0000 mg | Freq: Once | INTRAMUSCULAR | Status: DC | PRN

## 2016-06-17 MED ORDER — OXYCODONE HCL 5 MG/5ML PO SOLN: 5.0000 mg | Freq: Once | ORAL | Status: DC | PRN

## 2016-06-17 MED ORDER — LACTATED RINGERS IV SOLN
INTRAVENOUS | Status: DC
  Administered 2016-06-17 (×2): via INTRAVENOUS

## 2016-06-17 MED ORDER — LIDOCAINE 2% (20 MG/ML) 5 ML SYRINGE
INTRAMUSCULAR | Status: AC
  Filled 2016-06-17: qty 5

## 2016-06-17 MED ORDER — ROCURONIUM BROMIDE 10 MG/ML (PF) SYRINGE
PREFILLED_SYRINGE | INTRAVENOUS | Status: AC
  Filled 2016-06-17: qty 10

## 2016-06-17 MED ORDER — OXYCODONE HCL 5 MG PO TABS: 5.0000 mg | ORAL_TABLET | Freq: Once | ORAL | Status: DC | PRN

## 2016-06-17 MED ORDER — ASPIRIN EC 325 MG PO TBEC
325.0000 mg | DELAYED_RELEASE_TABLET | Freq: Every day | ORAL | Status: DC
  Administered 2016-06-18 – 2016-06-22 (×5): 325 mg via ORAL
  Filled 2016-06-17 (×5): qty 1

## 2016-06-17 MED ORDER — FENTANYL CITRATE (PF) 100 MCG/2ML IJ SOLN: 25.0000 ug | INTRAMUSCULAR | Status: DC | PRN

## 2016-06-17 MED ORDER — 0.9 % SODIUM CHLORIDE (POUR BTL) OPTIME
TOPICAL | Status: DC | PRN
  Administered 2016-06-17: 1000 mL

## 2016-06-17 MED ORDER — ACETAMINOPHEN 500 MG PO TABS
1000.0000 mg | ORAL_TABLET | Freq: Once | ORAL | Status: AC
  Administered 2016-06-17: 1000 mg via ORAL
  Filled 2016-06-17: qty 2

## 2016-06-17 MED ORDER — SUGAMMADEX SODIUM 200 MG/2ML IV SOLN
INTRAVENOUS | Status: AC
Start: 2016-06-17 — End: 2016-06-17
  Filled 2016-06-17: qty 2

## 2016-06-17 MED ORDER — CHLORHEXIDINE GLUCONATE 4 % EX LIQD
60.0000 mL | Freq: Once | CUTANEOUS | Status: AC
  Administered 2016-06-17: 4 via TOPICAL

## 2016-06-17 MED ORDER — HYDROCODONE-ACETAMINOPHEN 5-325 MG PO TABS
1.0000 | ORAL_TABLET | Freq: Four times a day (QID) | ORAL | Status: DC | PRN
  Administered 2016-06-17 – 2016-06-21 (×9): 2 via ORAL
  Administered 2016-06-21: 1 via ORAL
  Administered 2016-06-22: 2 via ORAL
  Filled 2016-06-17 (×6): qty 2
  Filled 2016-06-17: qty 1
  Filled 2016-06-17 (×5): qty 2

## 2016-06-17 MED ORDER — PHENYLEPHRINE HCL 10 MG/ML IJ SOLN
INTRAVENOUS | Status: DC | PRN
  Administered 2016-06-17: 70 ug/min via INTRAVENOUS

## 2016-06-17 MED ORDER — PROPOFOL 10 MG/ML IV BOLUS
INTRAVENOUS | Status: DC | PRN
  Administered 2016-06-17: 30 mg via INTRAVENOUS

## 2016-06-17 MED ORDER — FENTANYL CITRATE (PF) 100 MCG/2ML IJ SOLN
INTRAMUSCULAR | Status: AC
  Filled 2016-06-17: qty 2

## 2016-06-17 MED ORDER — PROPOFOL 10 MG/ML IV BOLUS
INTRAVENOUS | Status: AC
  Filled 2016-06-17: qty 20

## 2016-06-17 MED ORDER — PROPOFOL 500 MG/50ML IV EMUL
INTRAVENOUS | Status: DC | PRN
  Administered 2016-06-17: 15 ug/kg/min via INTRAVENOUS

## 2016-06-17 MED ORDER — HYDROMORPHONE HCL 1 MG/ML IJ SOLN: 1.0000 mg | INTRAMUSCULAR | Status: DC | PRN

## 2016-06-17 MED ORDER — PHENOL 1.4 % MT LIQD: 1.0000 | OROMUCOSAL | Status: DC | PRN

## 2016-06-17 MED ORDER — PHENYLEPHRINE HCL 10 MG/ML IJ SOLN
INTRAMUSCULAR | Status: DC | PRN
  Administered 2016-06-17: 200 ug via INTRAVENOUS
  Administered 2016-06-17: 80 ug via INTRAVENOUS

## 2016-06-17 MED ORDER — FENTANYL CITRATE (PF) 100 MCG/2ML IJ SOLN
INTRAMUSCULAR | Status: DC | PRN
  Administered 2016-06-17 (×2): 50 ug via INTRAVENOUS

## 2016-06-17 MED ORDER — OXYCODONE HCL 5 MG PO TABS
5.0000 mg | ORAL_TABLET | ORAL | Status: DC | PRN
Start: 2016-06-17 — End: 2016-06-17

## 2016-06-17 MED ORDER — ONDANSETRON HCL 4 MG/2ML IJ SOLN
INTRAMUSCULAR | Status: AC
  Filled 2016-06-17: qty 2

## 2016-06-17 MED ORDER — PHENYLEPHRINE 40 MCG/ML (10ML) SYRINGE FOR IV PUSH (FOR BLOOD PRESSURE SUPPORT)
PREFILLED_SYRINGE | INTRAVENOUS | Status: AC
  Filled 2016-06-17: qty 10

## 2016-06-17 SURGICAL SUPPLY — 32 items
CLSR STERI-STRIP ANTIMIC 1/2X4 (GAUZE/BANDAGES/DRESSINGS) ×2 IMPLANT
COVER PERINEAL POST (MISCELLANEOUS) ×2 IMPLANT
COVER SURGICAL LIGHT HANDLE (MISCELLANEOUS) ×2 IMPLANT
DRAPE STERI IOBAN 125X83 (DRAPES) ×2 IMPLANT
DRSG MEPILEX BORDER 4X4 (GAUZE/BANDAGES/DRESSINGS) ×3 IMPLANT
DURAPREP 26ML APPLICATOR (WOUND CARE) ×2 IMPLANT
ELECT REM PT RETURN 9FT ADLT (ELECTROSURGICAL) ×2
ELECTRODE REM PT RTRN 9FT ADLT (ELECTROSURGICAL) ×1 IMPLANT
GLOVE BIO SURGEON STRL SZ7.5 (GLOVE) ×4 IMPLANT
GLOVE BIOGEL PI IND STRL 8 (GLOVE) ×2 IMPLANT
GLOVE BIOGEL PI INDICATOR 8 (GLOVE) ×2
GOWN STRL REUS W/ TWL LRG LVL3 (GOWN DISPOSABLE) ×3 IMPLANT
GOWN STRL REUS W/TWL LRG LVL3 (GOWN DISPOSABLE) ×6
GUIDEROD T2 3X1000 (ROD) ×1 IMPLANT
K-WIRE  3.2X450M STR (WIRE) ×1
K-WIRE 3.2X450M STR (WIRE) ×1
KIT BASIN OR (CUSTOM PROCEDURE TRAY) ×2 IMPLANT
KIT ROOM TURNOVER OR (KITS) ×2 IMPLANT
KWIRE 3.2X450M STR (WIRE) IMPLANT
MANIFOLD NEPTUNE II (INSTRUMENTS) ×2 IMPLANT
NAIL GAMMA LG R 5TI 10X400X125 (Nail) ×1 IMPLANT
NS IRRIG 1000ML POUR BTL (IV SOLUTION) ×2 IMPLANT
PACK GENERAL/GYN (CUSTOM PROCEDURE TRAY) ×2 IMPLANT
PAD ARMBOARD 7.5X6 YLW CONV (MISCELLANEOUS) ×4 IMPLANT
SCREW LAG GAMMA 3 TI 10.5X90MM (Screw) ×1 IMPLANT
SUT MNCRL AB 4-0 PS2 18 (SUTURE) IMPLANT
SUT MON AB 2-0 CT1 36 (SUTURE) IMPLANT
SUT VIC AB 0 CT1 27 (SUTURE) ×2
SUT VIC AB 0 CT1 27XBRD ANBCTR (SUTURE) ×1 IMPLANT
TOWEL OR 17X24 6PK STRL BLUE (TOWEL DISPOSABLE) ×2 IMPLANT
TOWEL OR 17X26 10 PK STRL BLUE (TOWEL DISPOSABLE) ×2 IMPLANT
WATER STERILE IRR 1000ML POUR (IV SOLUTION) ×2 IMPLANT

## 2016-06-17 NOTE — Progress Notes (Signed)
[facility] PROGRESS NOTE  [name] ZOX:[id number] DOB: Oct 28, 1923 DOA: 06/15/2016   PCP: Angela Coxasanayaka, Gayani Y, MD     Assessment/Plan: Principal Problem:   Closed right hip fracture [facility](HCC) Active Problems:   Dementia   Critical lower limb ischemia   PVD (peripheral vascular disease) (HCC)   Hip fracture (HCC)   Goals of care, counseling/discussion   Advance care planning   Palliative care by specialist   82 y.o. female with dementia  [facility] and critical limb ischemia being managed conservatively was brought to the ER after patient had a fall at her living facility. Patient had an unwitnessed fall. Patient was complaining of right hip pain and x-rays done in the ER shows a right hip fracture. On-call orthopedic surgeon Dr. Eulah PontMurphy has been consulted and patient is being admitted for further management. On my exam patient is not in distress and denies any chest pain or shortness of breath.   ED Course: CT head neck are unremarkable. X-rays of the hip show right hip fracture.   Assessment and plan  Right hip fracture - patient has had previous multiple falls. At this time orthopedic surgeon Dr. Eulah PontMurphy has been consulted. Patient will be at moderate to high risk for surgery given the history of age and comorbidities. Orthopedic surgery is planning surgery on Friday, September 29. Patient is to receive Dilaudid and Robaxin for uncontrolled pain this morning. Also ordered oxycodone and prn Dilaudid for postop pain control  History of dementia on Aricept and Namenda. Patient has mild bradycardia and if heart rate drops further may have to hold dementia medications  Peripheral vascular disease followed by Dr. Nanetta BattyJonathan Berry She developed a ischemic ulcer on her left great toe approximately 3 months ago and since that time she has been nonambulatory. She has seen Dr. Leanord Hawkingobson at the wound care center on a weekly basis who has been debriding  this. He also has painful. Dopplers performed in our office 03/08/16 right ABI 0.59 and a left appointment 35. She has occluded SFAs bilaterally. She does have dementia and is on medications for this. Intervention would possibly lead to amputation and significant pain until that time versus attempt at angiography in percutaneous revascularization. As per Dr. Allyson SabalBerry  she is not femoropopliteal candidate given her age and comorbidities.Her quality of life is markedly diminished and she is now nonambulatory and wheelchair bound. Family opted for nonsurgical treatment only Palliative care consulted and following    DVT prophylaxsis to be determined by orthopedics  Code Status:  DO NOT RESUSCITATE    Family Communication: Discussed in detail with the patient's family, all imaging results, lab results explained to the patient   Disposition Plan:  Anticipated to have surgery today     Consultants:  Orthopedics  Procedures:  None  Antibiotics: Anti-infectives    Start     Dose/Rate Route Frequency Ordered Stop   06/17/16 1200  ceFAZolin (ANCEF) IVPB 2g/100 mL premix     2 g 200 mL/hr over 30 Minutes Intravenous To [facility] 06/17/16 0835 06/18/16 1200         HPI/Subjective: Complaining of severe pain, extremely agitated with leg spasms  Objective: Vitals:   06/16/16 2200 06/16/16 2230 06/16/16 2302 06/17/16 0555  BP:  (!) 127/55 (!) 131/50 (!) 131/43  Pulse:  85 80 74  Resp:   17 15  Temp:   99.1 F (37.3 C) 98.6 F (37 C)  TempSrc:  Oral   SpO2: 93% 93% 92% 91%  Weight:   67.9 kg (149 lb 11.1 oz)   Height:   5\' 6"  (1.676 m)     Intake/Output Summary (Last 24 hours) at 06/17/16 1059 Last data filed at 06/17/16 0555  Gross per 24 hour  Intake           760.83 ml  Output             1200 ml  Net          -439.17 ml    Exam:  Examination:  General exam: Appears calm and comfortable  Respiratory system: Clear to auscultation. Respiratory effort  normal. Cardiovascular system: S1 & S2 heard, RRR. No JVD, murmurs, rubs, gallops or clicks. No pedal edema. Gastrointestinal system: Abdomen is nondistended, soft and nontender. No organomegaly or masses felt. Normal bowel sounds heard. Central nervous system: Alert and oriented. No focal neurological deficits. Extremities: Symmetric 5 x 5 power.Ulcer on the dorsum of the left foot Skin: No rashes, lesions or ulcers Psychiatry: Judgement and insight appear normal. Mood & affect appropriate.     Data Reviewed: I have personally reviewed following labs and imaging studies  Micro Results Recent Results (from the past 240 hour(s))  MRSA PCR Screening     Status: None   Collection Time: 06/16/16 10:04 AM  Result Value Ref Range Status   MRSA by PCR NEGATIVE NEGATIVE Final    Comment:        The GeneXpert MRSA Assay (FDA approved for NASAL specimens only), is one component of a comprehensive MRSA colonization surveillance program. It is not intended to diagnose MRSA infection nor to guide or monitor treatment for MRSA infections.   Surgical pcr screen     Status: None   Collection Time: 06/17/16  2:00 AM  Result Value Ref Range Status   MRSA, PCR NEGATIVE NEGATIVE Final   Staphylococcus aureus NEGATIVE NEGATIVE Final    Comment:        The Xpert SA Assay (FDA approved for NASAL specimens in patients over 32 years of age), is one component of a comprehensive surveillance program.  Test performance has been validated by [facility] for patients greater than or equal to 63 year old. It is not intended to diagnose infection nor to guide or monitor treatment.     Radiology Reports Ct Head Wo Contrast  Result Date: 06/16/2016 CLINICAL DATA:  82 year old female with unwitnessed fall EXAM: CT HEAD WITHOUT CONTRAST TECHNIQUE: Contiguous axial images were obtained from the base of the skull through the vertex without intravenous contrast. COMPARISON:  Head CT dated 04/18/2016  FINDINGS: Brain: There is mild age-related atrophy and chronic microvascular ischemic changes. There is no acute intracranial hemorrhage. No mass effect or midline shift noted. No extra-axial fluid collections. Vascular: No hyperdense vessel or unexpected calcification. Skull: Normal. Negative for fracture or focal lesion. Sinuses/Orbits: No acute finding. Other: None IMPRESSION: No acute intracranial hemorrhage. Mild age-related atrophy and chronic microvascular ischemic disease. If symptoms persist and there are no contraindications, MRI may provide better evaluation if clinically indicated Electronically Signed   By: Elgie Collard M.D.   On: 06/16/2016 05:20   Ct Cervical Spine Wo Contrast  Result Date: 06/15/2016 CLINICAL DATA:  Status post fall, with concern for cervical spine injury. Initial encounter. EXAM: CT CERVICAL SPINE WITHOUT CONTRAST TECHNIQUE: Multidetector CT imaging of the cervical spine was performed without intravenous contrast. Multiplanar CT image reconstructions were also generated. COMPARISON:  None. FINDINGS: Alignment:  Normal. Skull base and vertebrae: No acute fracture. No primary bone lesion or focal pathologic process. Soft tissues and spinal canal: No prevertebral fluid or swelling. No visible canal hematoma. Disc levels: Mild multilevel disc space narrowing is noted along the mid to lower cervical spine, with small anterior and posterior disc osteophyte complexes. Upper chest: Scarring is noted at the lung apices. An accessory azygos lobe is seen. Mild calcification is noted at the carotid bifurcations bilaterally. The thyroid gland is unremarkable in appearance. Other: The visualized portions of the brain are unremarkable. IMPRESSION: 1. No evidence of fracture or subluxation along the cervical spine. 2. Minimal degenerative change at the mid to lower cervical spine. 3. Scarring at the lung apices.  Accessory azygos lobe noted. 4. Mild calcification at the carotid bifurcations  bilaterally. Carotid ultrasound could be considered for further evaluation, when and as deemed clinically appropriate. Electronically Signed   By: Roanna Raider M.D.   On: 06/15/2016 23:29   Dg Chest Port 1 View  Result Date: 06/16/2016 CLINICAL DATA:  Leukocytosis EXAM: PORTABLE CHEST 1 VIEW COMPARISON:  April 18, 2016 FINDINGS: There is generalized interstitial thickening. There is no frank edema or consolidation. Heart size and pulmonary vascularity are normal. No adenopathy. There is atherosclerotic calcification in the aorta. There is a hiatal hernia. Bones appear somewhat osteoporotic. IMPRESSION: Generalized interstitial thickening without edema or consolidation. Suspect chronic inflammatory type change in the lungs. Stable cardiac silhouette. Hiatal hernia present. There is aortic atherosclerosis. Electronically Signed   By: Bretta Bang III M.D.   On: 06/16/2016 09:26   Dg Hip Unilat With Pelvis 2-3 Views Right  Result Date: 06/15/2016 CLINICAL DATA:  Status post unwitnessed fall, with right hip pain. Initial encounter. EXAM: DG HIP (WITH OR WITHOUT PELVIS) 2-3V RIGHT COMPARISON:  None. FINDINGS: There is a comminuted right femoral intertrochanteric fracture, with a displaced lesser trochanteric fragment, and mild medial and inferior displacement of the distal femur. The right femoral head remains seated at the acetabulum. The left hip joint is grossly unremarkable in appearance. The sacroiliac joints are grossly unremarkable. Mild degenerative change is noted at the lower lumbar spine. The visualized bowel gas pattern is grossly unremarkable. IMPRESSION: Comminuted right femoral intertrochanteric fracture, with a displaced lesser trochanteric fragment, and mild medial and inferior displacement of the distal femur. Electronically Signed   By: Roanna Raider M.D.   On: 06/15/2016 23:31     CBC  Recent Labs Lab 06/15/16 2305 06/16/16 0548  WBC 18.6* 12.6*  HGB 12.7 10.7*  HCT 39.3  32.6*  PLT 229 221  MCV 89.9 87.2  MCH 29.1 28.6  MCHC 32.3 32.8  RDW 17.0* 16.8*  LYMPHSABS 1.9 1.6  MONOABS 1.7* 1.5*  EOSABS 0.1 0.0  BASOSABS 0.0 0.0    Chemistries   Recent Labs Lab 06/15/16 2305 06/16/16 0548  NA 140 139  K 4.7 4.7  CL 106 104  CO2 27 30  GLUCOSE 129* 133*  BUN 24* 19  CREATININE 0.85 0.76  CALCIUM 9.2 8.8*  AST  --  20  ALT  --  14  ALKPHOS  --  57  BILITOT  --  0.6   ------------------------------------------------------------------------------------------------------------------ estimated creatinine clearance is 43.8 mL/min (by C-G formula based on SCr of 0.76 mg/dL). ------------------------------------------------------------------------------------------------------------------ No results for input(s): HGBA1C in the last 72 hours. ------------------------------------------------------------------------------------------------------------------ No results for input(s): CHOL, HDL, LDLCALC, TRIG, CHOLHDL, LDLDIRECT in the last 72 hours. ------------------------------------------------------------------------------------------------------------------ No results for input(s): TSH, T4TOTAL, T3FREE, THYROIDAB in the last 72  hours.  Invalid input(s): FREET3 ------------------------------------------------------------------------------------------------------------------ No results for input(s): VITAMINB12, FOLATE, FERRITIN, TIBC, IRON, RETICCTPCT in the last 72 hours.  Coagulation profile  Recent Labs Lab 06/15/16 2355  INR 0.95    No results for input(s): DDIMER in the last 72 hours.  Cardiac Enzymes No results for input(s): CKMB, TROPONINI, MYOGLOBIN in the last 168 hours.  Invalid input(s): CK ------------------------------------------------------------------------------------------------------------------ Invalid input(s): POCBNP   CBG: No results for input(s): GLUCAP in the last 168 hours.     Studies: Ct Head Wo  Contrast  Result Date: 06/16/2016 CLINICAL DATA:  82 year old female with unwitnessed fall EXAM: CT HEAD WITHOUT CONTRAST TECHNIQUE: Contiguous axial images were obtained from the base of the skull through the vertex without intravenous contrast. COMPARISON:  Head CT dated 04/18/2016 FINDINGS: Brain: There is mild age-related atrophy and chronic microvascular ischemic changes. There is no acute intracranial hemorrhage. No mass effect or midline shift noted. No extra-axial fluid collections. Vascular: No hyperdense vessel or unexpected calcification. Skull: Normal. Negative for fracture or focal lesion. Sinuses/Orbits: No acute finding. Other: None IMPRESSION: No acute intracranial hemorrhage. Mild age-related atrophy and chronic microvascular ischemic disease. If symptoms persist and there are no contraindications, MRI may provide better evaluation if clinically indicated Electronically Signed   By: Elgie Collard M.D.   On: 06/16/2016 05:20   Ct Cervical Spine Wo Contrast  Result Date: 06/15/2016 CLINICAL DATA:  Status post fall, with concern for cervical spine injury. Initial encounter. EXAM: CT CERVICAL SPINE WITHOUT CONTRAST TECHNIQUE: Multidetector CT imaging of the cervical spine was performed without intravenous contrast. Multiplanar CT image reconstructions were also generated. COMPARISON:  None. FINDINGS: Alignment: Normal. Skull base and vertebrae: No acute fracture. No primary bone lesion or focal pathologic process. Soft tissues and spinal canal: No prevertebral fluid or swelling. No visible canal hematoma. Disc levels: Mild multilevel disc space narrowing is noted along the mid to lower cervical spine, with small anterior and posterior disc osteophyte complexes. Upper chest: Scarring is noted at the lung apices. An accessory azygos lobe is seen. Mild calcification is noted at the carotid bifurcations bilaterally. The thyroid gland is unremarkable in appearance. Other: The visualized portions of  the brain are unremarkable. IMPRESSION: 1. No evidence of fracture or subluxation along the cervical spine. 2. Minimal degenerative change at the mid to lower cervical spine. 3. Scarring at the lung apices.  Accessory azygos lobe noted. 4. Mild calcification at the carotid bifurcations bilaterally. Carotid ultrasound could be considered for further evaluation, when and as deemed clinically appropriate. Electronically Signed   By: Roanna Raider M.D.   On: 06/15/2016 23:29   Dg Chest Port 1 View  Result Date: 06/16/2016 CLINICAL DATA:  Leukocytosis EXAM: PORTABLE CHEST 1 VIEW COMPARISON:  April 18, 2016 FINDINGS: There is generalized interstitial thickening. There is no frank edema or consolidation. Heart size and pulmonary vascularity are normal. No adenopathy. There is atherosclerotic calcification in the aorta. There is a hiatal hernia. Bones appear somewhat osteoporotic. IMPRESSION: Generalized interstitial thickening without edema or consolidation. Suspect chronic inflammatory type change in the lungs. Stable cardiac silhouette. Hiatal hernia present. There is aortic atherosclerosis. Electronically Signed   By: Bretta Bang III M.D.   On: 06/16/2016 09:26   Dg Hip Unilat With Pelvis 2-3 Views Right  Result Date: 06/15/2016 CLINICAL DATA:  Status post unwitnessed fall, with right hip pain. Initial encounter. EXAM: DG HIP (WITH OR WITHOUT PELVIS) 2-3V RIGHT COMPARISON:  None. FINDINGS: There is a comminuted right femoral intertrochanteric fracture, with  a displaced lesser trochanteric fragment, and mild medial and inferior displacement of the distal femur. The right femoral head remains seated at the acetabulum. The left hip joint is grossly unremarkable in appearance. The sacroiliac joints are grossly unremarkable. Mild degenerative change is noted at the lower lumbar spine. The visualized bowel gas pattern is grossly unremarkable. IMPRESSION: Comminuted right femoral intertrochanteric fracture, with  a displaced lesser trochanteric fragment, and mild medial and inferior displacement of the distal femur. Electronically Signed   By: Roanna Raider M.D.   On: 06/15/2016 23:31      No results found for: HGBA1C Lab Results  Component Value Date   CREATININE 0.76 06/16/2016       Scheduled Meds: . calcium carbonate  1 tablet Oral Daily  .  ceFAZolin (ANCEF) IV  2 g Intravenous To SS-Surg  . chlorhexidine  60 mL Topical Once  . donepezil  10 mg Oral QHS  .  HYDROmorphone (DILAUDID) injection  1 mg Intravenous Once  . memantine  10 mg Oral BID  . methocarbamol (ROBAXIN)  IV  500 mg Intravenous Once  . multivitamin with minerals  1 tablet Oral Daily  . omega-3 acid ethyl esters  1 capsule Oral BID  . povidone-iodine  2 application Topical Once   Continuous Infusions: . lactated ringers       LOS: 1 day    Time spent: >30 MINS    [facility]  [facility] Pager [phone]. If 7PM-7AM, please contact night-coverage at [email or web address], password [facility] 06/17/2016, 10:59 AM  LOS: 1 day

## 2016-06-17 NOTE — Consult Note (Signed)
WOC consult requested for foot wound.  This was performed yesterday; please refer to previous progress notes for assessement, measurements, and topical treatment orders have been provided for the bedside nurses. Please re-consult if further assistance is needed.  Thank-you,  Cammie Mcgeeawn Dexter Signor MSN, RN, CWOCN, [place]ylon[place]CN-AP, CNS [phone]

## 2016-06-17 NOTE — Anesthesia Preprocedure Evaluation (Signed)
Anesthesia Evaluation  Patient identified by MRN, date of birth, ID band Patient awake    Reviewed: Allergy & Precautions, NPO status , Patient's Chart, lab work & pertinent test results  Airway Mallampati: II  TM Distance: >3 FB Neck ROM: Full    Dental  (+) Teeth Intact, Dental Advisory Given   Pulmonary    breath sounds clear to auscultation       Cardiovascular  Rhythm:Regular Rate:Normal     Neuro/Psych    GI/Hepatic   Endo/Other    Renal/GU      Musculoskeletal   Abdominal   Peds  Hematology   Anesthesia Other Findings   Reproductive/Obstetrics                             Anesthesia Physical Anesthesia Plan  ASA: III  Anesthesia Plan: MAC and Spinal   Post-op Pain Management:    Induction: Intravenous  Airway Management Planned: Natural Airway and Nasal Cannula  Additional Equipment:   Intra-op Plan:   Post-operative Plan: Extubation in OR  Informed Consent: I have reviewed the patients History and Physical, chart, labs and discussed the procedure including the risks, benefits and alternatives for the proposed anesthesia with the patient or authorized representative who has indicated his/her understanding and acceptance.   Dental advisory given  Plan Discussed with: CRNA and Anesthesiologist  Anesthesia Plan Comments:         Anesthesia Quick Evaluation

## 2016-06-17 NOTE — Transfer of Care (Signed)
Immediate Anesthesia Transfer of Care Note  Patient: Oswald HillockHilda B Covault  Procedure(s) Performed: Procedure(s): INTRAMEDULLARY (IM) NAIL FEMORAL (Right)  Patient Location: PACU  Anesthesia Type:MAC  Level of Consciousness: awake and patient cooperative  Airway & Oxygen Therapy: Patient Spontanous Breathing  Post-op Assessment: Report given to RN and Post -op Vital signs reviewed and stable  Post vital signs: Reviewed and stable  Last Vitals:  Vitals:   06/16/16 2302 06/17/16 0555  BP: (!) 131/50 (!) 131/43  Pulse: 80 74  Resp: 17 15  Temp: 37.3 C 37 C    Last Pain:  Vitals:   06/17/16 0844  TempSrc:   PainSc: 0-No pain      Patients Stated Pain Goal: 2 (06/16/16 2117)  Complications: No apparent anesthesia complications

## 2016-06-17 NOTE — Progress Notes (Signed)
Pt returned to 6N12 from PACU.  Pt on RA.  Pt has 20G to Lt FA SL.  Pt has foam dsg to rt hip C/D/I.  Pt has SCDs in place.  Report rcvd from Ly, RN.  Pt has no complaints at the moment.  Will continue to monitor.

## 2016-06-17 NOTE — Op Note (Signed)
DATE OF SURGERY:  06/17/2016  TIME: 2:56 PM  PATIENT NAME:  Tami Harris  AGE: 82 y.o.  PRE-OPERATIVE DIAGNOSIS:  Hip Fracture Right   POST-OPERATIVE DIAGNOSIS:  SAME  PROCEDURE:  INTRAMEDULLARY (IM) NAIL FEMORAL  SURGEON:  Shawndrea Rutkowski D  ASSISTANT:  Janace LittenBrandon Parry, OPA-C, present and scrubbed throughout the case, critical for completion in a timely fashion, and for retraction, instrumentation, and closure.   OPERATIVE IMPLANTS: [employer] Gamma Nail  PREOPERATIVE INDICATIONS:  Tami HillockHilda B Herro is a 82 y.o. year old who fell and suffered a hip fracture. She was brought into the ER and then admitted and optimized and then elected for surgical intervention.    The risks benefits and alternatives were discussed with the patient including but not limited to the risks of nonoperative treatment, versus surgical intervention including infection, bleeding, nerve injury, malunion, nonunion, hardware prominence, hardware failure, need for hardware removal, blood clots, cardiopulmonary complications, morbidity, mortality, among others, and they were willing to proceed.    OPERATIVE PROCEDURE:  The patient was brought to the operating room and placed in the supine position. General anesthesia was administered. She was placed on the fracture table.  Closed reduction was performed under C-arm guidance. Time out was then performed after sterile prep and drape. She received preoperative antibiotics.  Incision was made proximal to the greater trochanter. A guidewire was placed in the appropriate position. Confirmation was made on AP and lateral views. The above-named nail was opened. I opened the proximal femur with a reamer. I then placed the nail by hand easily down. I did not need to ream the femur.  Once the nail was completely seated, I placed a guidepin into the femoral head into the center center position. I measured the length, and then reamed the lateral cortex and up into the head. I then  placed the lag screw. Slight compression was applied. Anatomic fixation achieved. Bone quality was mediocre.  I then secured the proximal interlocking bolt, and took off a half a turn, and then removed the instruments, and took final C-arm pictures AP and lateral the entire length of the leg.   Anatomic reconstruction was achieved, and the wounds were irrigated copiously and closed with Vicryl followed by staples and sterile gauze for the skin. The patient was awakened and returned to PACU in stable and satisfactory condition. There no complications and the patient tolerated the procedure well.  She will be weightbearing as tolerated, and will be on chemical px  for a period of four weeks after discharge.   Margarita Ranaimothy Aleayah Chico, M.D.

## 2016-06-17 NOTE — Progress Notes (Signed)
Daily Progress Note   Patient Name: Tami Harris       Date: 06/17/2016 DOB: [birth year]  Age: 82 y.o. MRN#: [id number] Attending Physician: Reyne Dumas, MD Primary Care Physician: Merlene Laughter, MD Admit Date: 06/15/2016  Reason for Consultation/Follow-up: Establishing goals of care  Subjective: Met with daughter this morning to further process risks and benefits of hip fracture repair surgery. We also discussed at length disease trajectory secondary to moderate dementia. Patient also has an ischemic left leg and a nonhealing ulcer on this leg. We talked about how patient would be a poor rehabilitation candidate not only from poor clinical status of her left leg (which likely contributed to her fall to begin with) but as well as her dementia. Daughter was able to verbalize that she is has seen worsening of underlying dementia. We did discuss pain management both in terms of non-repair of hip fracture as well as repair of hip fracture. Daughter is prepared that despite surgery her mother would not be able to participate in rehabilitation and may become bedbound afterwards. She wishes to go forward with surgery at this point. She states despite the likely poor outcome in terms of her mother's functional improvement she feels that this route at least gives her a slight chance if nothing else up being more comfortable  Length of Stay: 1  Current Medications: Scheduled Meds:  . [MAR Hold] calcium carbonate  1 tablet Oral Daily  . [MAR Hold] donepezil  10 mg Oral QHS  . [MAR Hold] memantine  10 mg Oral BID  . [MAR Hold] multivitamin with minerals  1 tablet Oral Daily  . [MAR Hold] omega-3 acid ethyl esters  1 capsule Oral BID  . povidone-iodine  2 application Topical Once    Continuous  Infusions: . lactated ringers      PRN Meds: 0.9 % irrigation (POUR BTL), [MAR Hold]  HYDROmorphone (DILAUDID) injection, [MAR Hold]  morphine injection, [MAR Hold] oxyCODONE  Physical Exam  Constitutional:  Cachetic frail female in pain  HENT:  Head: Normocephalic and atraumatic.  Neck: Normal range of motion.  Pulmonary/Chest: Effort normal.  Neurological: She is alert.  Oriented to self, seems to recognize her daughter. She is asking when she will go to surgery and does seem to recognize that she has a hip fracture  Skin:  Skin is warm and dry.  Psychiatric:  Anxious  Nursing note and vitals reviewed.           Vital Signs: BP (!) 131/43   Pulse 74   Temp 98.6 F (37 C)   Resp 15   Ht 5\' 6"  (1.676 m)   Wt 67.9 kg (149 lb 11.1 oz)   SpO2 91%   BMI 24.16 kg/m  SpO2: SpO2: 91 % O2 Device: O2 Device: Not Delivered O2 Flow Rate: O2 Flow Rate (L/min): 2 L/min  Intake/output summary:  Intake/Output Summary (Last 24 hours) at 06/17/16 1506 Last data filed at 06/17/16 1454  Gross per 24 hour  Intake           815.83 ml  Output              850 ml  Net           -34.17 ml   LBM: Last BM Date: 06/15/16 Baseline Weight: Weight: 63.9 kg (140 lb 14 oz) Most recent weight: Weight: 67.9 kg (149 lb 11.1 oz)       Palliative Assessment/Data:      Patient Active Problem List   Diagnosis Date Noted  . Closed right hip fracture (HCC) 06/16/2016  . Hip fracture (HCC) 06/16/2016  . Goals of care, counseling/discussion   . Advance care planning   . Palliative care by specialist   . Fall 04/18/2016  . Distal radius fracture 04/18/2016  . Syncope 04/18/2016  . PVD (peripheral vascular disease) (HCC)   . Critical lower limb ischemia 04/08/2016  . 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 Pneumonia 10/18/2011  . Dementia 10/18/2011    Palliative Care Assessment & Plan   Patient Profile: 82 y.o. female  with past medical history of PVD w/ limb ischemia, dementia and falls  admitted on 06/15/2016 from assisted living with hip fracture d/t fall at assisted living [facility] She was recently discharged from the hospital on 04/20/16 for an admission for a radial fracture, also from a fall.  Assessment: Patient is alert and was initially capable of making polite conversation but rapidly developed complaints of pain and began to moan and cry.  Recommendations/Plan:  Proceed with surgery today at 12  Please feel free to consult palliative medicineif additional needs are required for pain management   Code Status:    Code Status Orders        Start     Ordered   06/16/16 0526  Do not attempt resuscitation (DNR)  Continuous    Question Answer Comment  In the event of cardiac or respiratory ARREST Do not call a "code blue"   In the event of cardiac or respiratory ARREST Do not perform Intubation, CPR, defibrillation or ACLS   In the event of cardiac or respiratory ARREST Use medication by any route, position, wound care, and other measures to relive pain and suffering. May use oxygen, suction and manual treatment of airway obstruction as needed for comfort.      06/16/16 0526    Code Status History    Date Active Date Inactive Code Status Order ID Comments User Context   05/16/2016 12:27 PM 05/16/2016  8:04 PM Full Code 05/18/2016  [id number], MD Inpatient   04/18/2016 10:50 AM 04/20/2016  6:32 PM DNR 06/20/2016  [id number], NP ED   04/18/2016 10:49 AM 04/18/2016 10:50 AM Full Code 04/20/2016  [id number], NP ED   10/18/2011  5:52 PM 10/19/2011  8:23 PM DNR  [id number]  Samuella Cota, MD Inpatient    Advance Directive Documentation   Flowsheet Row Most Recent Value  Type of Advance Directive  Out of facility DNR (pink MOST or yellow form)  Pre-existing out of facility DNR order (yellow form or pink MOST form)  No data  "MOST" Form in Place?  No data       Prognosis:   Unable to determine  Discharge Planning:  To Be Determined  Care plan  was discussed with Dr. Allyson Sabal  Thank you for allowing the Palliative Medicine Team to assist in the care of this patient.   Time In: 0900 Time Out: 0945 Total Time 45 min Prolonged Time Billed  no       Greater than 50%  of this time was spent counseling and coordinating care related to the above assessment and plan.  Dory Horn, NP  Please contact Palliative Medicine Team phone at [phone] for questions and concerns.

## 2016-06-17 NOTE — Anesthesia Procedure Notes (Signed)
Procedure Name: MAC Date/Time: 06/17/2016 1:58 PM Performed by: Rosiland OzMEYERS, Nalin Mazzocco Pre-anesthesia Checklist: Patient identified, Emergency Drugs available, Suction available, Patient being monitored and Timeout performed Patient Re-evaluated:Patient Re-evaluated prior to inductionOxygen Delivery Method: Simple face mask

## 2016-06-18 DIAGNOSIS — I998 Other disorder of circulatory system: Secondary | ICD-10-CM

## 2016-06-18 DIAGNOSIS — Z515 Encounter for palliative care: Secondary | ICD-10-CM

## 2016-06-18 LAB — COMPREHENSIVE METABOLIC PANEL
ALBUMIN: 2.4 g/dL — AB (ref 3.5–5.0)
ALT: 12 U/L — ABNORMAL LOW (ref 14–54)
AST: 22 U/L (ref 15–41)
Alkaline Phosphatase: 48 U/L (ref 38–126)
Anion gap: 9 (ref 5–15)
BUN: 11 mg/dL (ref 6–20)
CO2: 26 mmol/L (ref 22–32)
Calcium: 8.3 mg/dL — ABNORMAL LOW (ref 8.9–10.3)
Chloride: 100 mmol/L — ABNORMAL LOW (ref 101–111)
Creatinine, Ser: 0.83 mg/dL (ref 0.44–1.00)
GFR calc Af Amer: >60 mL/min (ref >60)
GLUCOSE: 156 mg/dL — AB (ref 65–99)
POTASSIUM: 4 mmol/L (ref 3.5–5.1)
Sodium: 135 mmol/L (ref 135–145)
Total Bilirubin: 0.2 mg/dL — ABNORMAL LOW (ref 0.3–1.2)
Total Protein: 4.8 g/dL — ABNORMAL LOW (ref 6.5–8.1)

## 2016-06-18 MED ORDER — METHOCARBAMOL 1000 MG/10ML IJ SOLN
500.0000 mg | Freq: Three times a day (TID) | INTRAMUSCULAR | Status: DC | PRN
  Administered 2016-06-18 – 2016-06-20 (×3): 500 mg via INTRAVENOUS
  Filled 2016-06-18 (×6): qty 5

## 2016-06-18 NOTE — Progress Notes (Signed)
[facility] PROGRESS NOTE  Tami Harris ZOX:[id number] DOB: 03-06-1924 DOA: 06/15/2016   PCP: Angela Cox, MD     Assessment/Plan: Principal Problem:   Closed right hip fracture [facility]) Active Problems:   Dementia   Critical lower limb ischemia   PVD (peripheral vascular disease) (HCC)   Hip fracture (HCC)   Goals of care, counseling/discussion   Advance care planning   Palliative care by specialist   82 y.o. female with dementia  from assisted living at [facility] and critical limb ischemia being managed conservatively was brought to the ER after patient had a fall at her living facility. Patient had an unwitnessed fall. Patient was complaining of right hip pain and x-rays done in the ER shows a right hip fracture. On-call orthopedic surgeon Dr. Eulah Pont has been consulted and patient is being admitted for further management. On my exam patient is not in distress and denies any chest pain or shortness of breath.   ED Course: CT head neck are unremarkable. X-rays of the hip show right hip fracture.   Assessment and plan Right hip fracture  status post intramedullary nail on 9/29 by Dr. Eulah Pont - patient has had previous multiple falls.She was recently discharged from the hospital on 04/20/16 for an admission for a radial fracture, also from a fall.  Patient will be at moderate to high risk for surgery given the history of age and comorbidities.   Patient is to receive Dilaudid and Robaxin for uncontrolled pain this morning. Also ordered oxycodone and prn Dilaudid for postop pain control Will need SNF   History of dementia on Aricept and Namenda. Patient has mild bradycardia and if heart rate drops further may have to hold dementia medications  Peripheral vascular disease followed by Dr. Nanetta Batty She developed a ischemic ulcer on her left great toe approximately 3 months ago and since that time she has been nonambulatory. She has seen Dr. Leanord Hawking at  the wound care center on a weekly basis who has been debriding this. He also has painful. Dopplers performed in our office 03/08/16 right ABI 0.59 and a left appointment 35. She has occluded SFAs bilaterally. She does have dementia and is on medications for this. Intervention would possibly lead to amputation and significant pain until that time versus attempt at angiography in percutaneous revascularization. As per Dr. Allyson Sabal  she is not femoropopliteal candidate given her age and comorbidities.Her quality of life is markedly diminished and she is now nonambulatory and wheelchair bound. Family opted for nonsurgical treatment only Palliative care consulted  , patient would need to be followed by palliative care services at SNF    DVT prophylaxsis to be determined by orthopedics  Code Status:  DO NOT RESUSCITATE    Family Communication: Discussed in detail with the patient's family, all imaging results, lab results explained to the patient   Disposition Plan:  SNF Monday     Consultants:  Orthopedics  Palliative care  Procedures:  None  Antibiotics: Anti-infectives    Start     Dose/Rate Route Frequency Ordered Stop   06/17/16 2000  ceFAZolin (ANCEF) IVPB 2g/100 mL premix     2 g 200 mL/hr over 30 Minutes Intravenous Every 6 hours 06/17/16 1701 06/18/16 0222   06/17/16 1200  ceFAZolin (ANCEF) IVPB 2g/100 mL premix     2 g 200 mL/hr over 30 Minutes Intravenous To [facility] 06/17/16 0835 06/17/16 1422         HPI/Subjective:  Resting comfortably  Objective: Vitals:   06/17/16 1647 06/17/16 1655 06/17/16 2109 06/18/16 0542  BP:  118/65 (!) 131/59 (!) 147/77  Pulse:  62 61 97  Resp:  14 17 18   Temp: 97.6 F (36.4 C) 97.6 F (36.4 C) 97.7 F (36.5 C) 99.5 F (37.5 C)  TempSrc:  Axillary Axillary Oral  SpO2:  94% 95% 100%  Weight:      Height:        Intake/Output Summary (Last 24 hours) at 06/18/16 [id number] Last data filed at 06/18/16 0500  Gross per 24  hour  Intake             1345 ml  Output              375 ml  Net              970 ml    Exam:  Examination:  General exam: Appears calm and comfortable  Respiratory system: Clear to auscultation. Respiratory effort normal. Cardiovascular system: S1 & S2 heard, RRR. No JVD, murmurs, rubs, gallops or clicks. No pedal edema. Gastrointestinal system: Abdomen is nondistended, soft and nontender. No organomegaly or masses felt. Normal bowel sounds heard. Central nervous system: Alert and oriented. No focal neurological deficits. Extremities: Symmetric 5 x 5 power.Ulcer on the dorsum of the left foot Skin: No rashes, lesions or ulcers Psychiatry: Judgement and insight appear normal. Mood & affect appropriate.     Data Reviewed: I have personally reviewed following labs and imaging studies  Micro Results Recent Results (from the past 240 hour(s))  MRSA PCR Screening     Status: None   Collection Time: 06/16/16 10:04 AM  Result Value Ref Range Status   MRSA by PCR NEGATIVE NEGATIVE Final    Comment:        The GeneXpert MRSA Assay (FDA approved for NASAL specimens only), is one component of a comprehensive MRSA colonization surveillance program. It is not intended to diagnose MRSA infection nor to guide or monitor treatment for MRSA infections.   Surgical pcr screen     Status: None   Collection Time: 06/17/16  2:00 AM  Result Value Ref Range Status   MRSA, PCR NEGATIVE NEGATIVE Final   Staphylococcus aureus NEGATIVE NEGATIVE Final    Comment:        The Xpert SA Assay (FDA approved for NASAL specimens in patients over 80 years of age), is one component of a comprehensive surveillance program.  Test performance has been validated by [facility] for patients greater than or equal to 80 year old. It is not intended to diagnose infection nor to guide or monitor treatment.     Radiology Reports Ct Head Wo Contrast  Result Date: 06/16/2016 CLINICAL DATA:  82 year old  female with unwitnessed fall EXAM: CT HEAD WITHOUT CONTRAST TECHNIQUE: Contiguous axial images were obtained from the base of the skull through the vertex without intravenous contrast. COMPARISON:  Head CT dated 04/18/2016 FINDINGS: Brain: There is mild age-related atrophy and chronic microvascular ischemic changes. There is no acute intracranial hemorrhage. No mass effect or midline shift noted. No extra-axial fluid collections. Vascular: No hyperdense vessel or unexpected calcification. Skull: Normal. Negative for fracture or focal lesion. Sinuses/Orbits: No acute finding. Other: None IMPRESSION: No acute intracranial hemorrhage. Mild age-related atrophy and chronic microvascular ischemic disease. If symptoms persist and there are no contraindications, MRI may provide better evaluation if clinically indicated Electronically Signed   By: Elgie CollardArash  Radparvar M.D.   On: 06/16/2016 05:20  Ct Cervical Spine Wo Contrast  Result Date: 06/15/2016 CLINICAL DATA:  Status post fall, with concern for cervical spine injury. Initial encounter. EXAM: CT CERVICAL SPINE WITHOUT CONTRAST TECHNIQUE: Multidetector CT imaging of the cervical spine was performed without intravenous contrast. Multiplanar CT image reconstructions were also generated. COMPARISON:  None. FINDINGS: Alignment: Normal. Skull base and vertebrae: No acute fracture. No primary bone lesion or focal pathologic process. Soft tissues and spinal canal: No prevertebral fluid or swelling. No visible canal hematoma. Disc levels: Mild multilevel disc space narrowing is noted along the mid to lower cervical spine, with small anterior and posterior disc osteophyte complexes. Upper chest: Scarring is noted at the lung apices. An accessory azygos lobe is seen. Mild calcification is noted at the carotid bifurcations bilaterally. The thyroid gland is unremarkable in appearance. Other: The visualized portions of the brain are unremarkable. IMPRESSION: 1. No evidence of  fracture or subluxation along the cervical spine. 2. Minimal degenerative change at the mid to lower cervical spine. 3. Scarring at the lung apices.  Accessory azygos lobe noted. 4. Mild calcification at the carotid bifurcations bilaterally. Carotid ultrasound could be considered for further evaluation, when and as deemed clinically appropriate. Electronically Signed   By: Roanna Raider M.D.   On: 06/15/2016 23:29   Dg Chest Port 1 View  Result Date: 06/16/2016 CLINICAL DATA:  Leukocytosis EXAM: PORTABLE CHEST 1 VIEW COMPARISON:  April 18, 2016 FINDINGS: There is generalized interstitial thickening. There is no frank edema or consolidation. Heart size and pulmonary vascularity are normal. No adenopathy. There is atherosclerotic calcification in the aorta. There is a hiatal hernia. Bones appear somewhat osteoporotic. IMPRESSION: Generalized interstitial thickening without edema or consolidation. Suspect chronic inflammatory type change in the lungs. Stable cardiac silhouette. Hiatal hernia present. There is aortic atherosclerosis. Electronically Signed   By: Bretta Bang III M.D.   On: 06/16/2016 09:26   Dg C-arm 1-60 Min  Result Date: 06/17/2016 CLINICAL DATA:  Right intra medullary nail femur. EXAM: DG C-ARM 61-120 MIN; RIGHT FEMUR 2 VIEWS COMPARISON:  Hip radiography from 2 days ago FINDINGS: Three intraprocedural fluoroscopic images show right femur intra medullary nail with dynamic hip screw. Stable proximal distraction of a lesser trochanter fragment. Intertrochanteric fracture has been reduced. No unexpected finding. IMPRESSION: Fluoroscopy for intertrochanteric right femur fracture fixation. No unexpected finding. Electronically Signed   By: Marnee Spring M.D.   On: 06/17/2016 15:18   Dg Hip Unilat With Pelvis 2-3 Views Right  Result Date: 06/15/2016 CLINICAL DATA:  Status post unwitnessed fall, with right hip pain. Initial encounter. EXAM: DG HIP (WITH OR WITHOUT PELVIS) 2-3V RIGHT  COMPARISON:  None. FINDINGS: There is a comminuted right femoral intertrochanteric fracture, with a displaced lesser trochanteric fragment, and mild medial and inferior displacement of the distal femur. The right femoral head remains seated at the acetabulum. The left hip joint is grossly unremarkable in appearance. The sacroiliac joints are grossly unremarkable. Mild degenerative change is noted at the lower lumbar spine. The visualized bowel gas pattern is grossly unremarkable. IMPRESSION: Comminuted right femoral intertrochanteric fracture, with a displaced lesser trochanteric fragment, and mild medial and inferior displacement of the distal femur. Electronically Signed   By: Roanna Raider M.D.   On: 06/15/2016 23:31   Dg Femur, Min 2 Views Right  Result Date: 06/17/2016 CLINICAL DATA:  Right intra medullary nail femur. EXAM: DG C-ARM 61-120 MIN; RIGHT FEMUR 2 VIEWS COMPARISON:  Hip radiography from 2 days ago FINDINGS: Three intraprocedural fluoroscopic images show  right femur intra medullary nail with dynamic hip screw. Stable proximal distraction of a lesser trochanter fragment. Intertrochanteric fracture has been reduced. No unexpected finding. IMPRESSION: Fluoroscopy for intertrochanteric right femur fracture fixation. No unexpected finding. Electronically Signed   By: Marnee Spring M.D.   On: 06/17/2016 15:18   Dg Femur Port, Min 2 Views Right  Result Date: 06/18/2016 CLINICAL DATA:  Postoperative radiograph, status post internal fixation of right femoral fracture. Initial encounter. EXAM: RIGHT FEMUR PORTABLE 1 VIEW COMPARISON:  Right femur intraoperative films performed earlier today at 2:50 a.m. FINDINGS: There has been placement of a right femoral intramedullary rod, transfixing the patient's intertrochanteric fracture in grossly anatomic alignment. A displaced lesser trochanteric fragment is again seen. The right femoral head remains seated at the acetabulum. No new fractures are seen. The  right knee joint is grossly unremarkable in appearance. No knee joint effusion is seen. Scattered vascular calcifications are noted. A fabella is noted. IMPRESSION: Status post internal fixation of right femoral intertrochanteric fracture in grossly anatomic alignment. No new fracture seen. Displaced lesser trochanteric fragment again noted. Electronically Signed   By: Roanna Raider M.D.   On: 06/18/2016 01:42     CBC  Recent Labs Lab 06/15/16 2305 06/16/16 0548  WBC 18.6* 12.6*  HGB 12.7 10.7*  HCT 39.3 32.6*  PLT 229 221  MCV 89.9 87.2  MCH 29.1 28.6  MCHC 32.3 32.8  RDW 17.0* 16.8*  LYMPHSABS 1.9 1.6  MONOABS 1.7* 1.5*  EOSABS 0.1 0.0  BASOSABS 0.0 0.0    Chemistries   Recent Labs Lab 06/15/16 2305 06/16/16 0548  NA 140 139  K 4.7 4.7  CL 106 104  CO2 27 30  GLUCOSE 129* 133*  BUN 24* 19  CREATININE 0.85 0.76  CALCIUM 9.2 8.8*  AST  --  20  ALT  --  14  ALKPHOS  --  57  BILITOT  --  0.6   ------------------------------------------------------------------------------------------------------------------ estimated creatinine clearance is 43.8 mL/min (by C-G formula based on SCr of 0.76 mg/dL). ------------------------------------------------------------------------------------------------------------------ No results for input(s): HGBA1C in the last 72 hours. ------------------------------------------------------------------------------------------------------------------ No results for input(s): CHOL, HDL, LDLCALC, TRIG, CHOLHDL, LDLDIRECT in the last 72 hours. ------------------------------------------------------------------------------------------------------------------ No results for input(s): TSH, T4TOTAL, T3FREE, THYROIDAB in the last 72 hours.  Invalid input(s): FREET3 ------------------------------------------------------------------------------------------------------------------ No results for input(s): VITAMINB12, FOLATE, FERRITIN, TIBC, IRON,  RETICCTPCT in the last 72 hours.  Coagulation profile  Recent Labs Lab 06/15/16 2355  INR 0.95    No results for input(s): DDIMER in the last 72 hours.  Cardiac Enzymes No results for input(s): CKMB, TROPONINI, MYOGLOBIN in the last 168 hours.  Invalid input(s): CK ------------------------------------------------------------------------------------------------------------------ Invalid input(s): POCBNP   CBG: No results for input(s): GLUCAP in the last 168 hours.     Studies: Dg Chest Port 1 View  Result Date: 06/16/2016 CLINICAL DATA:  Leukocytosis EXAM: PORTABLE CHEST 1 VIEW COMPARISON:  April 18, 2016 FINDINGS: There is generalized interstitial thickening. There is no frank edema or consolidation. Heart size and pulmonary vascularity are normal. No adenopathy. There is atherosclerotic calcification in the aorta. There is a hiatal hernia. Bones appear somewhat osteoporotic. IMPRESSION: Generalized interstitial thickening without edema or consolidation. Suspect chronic inflammatory type change in the lungs. Stable cardiac silhouette. Hiatal hernia present. There is aortic atherosclerosis. Electronically Signed   By: Bretta Bang III M.D.   On: 06/16/2016 09:26   Dg C-arm 1-60 Min  Result Date: 06/17/2016 CLINICAL DATA:  Right intra medullary nail femur. EXAM: DG C-ARM  61-120 MIN; RIGHT FEMUR 2 VIEWS COMPARISON:  Hip radiography from 2 days ago FINDINGS: Three intraprocedural fluoroscopic images show right femur intra medullary nail with dynamic hip screw. Stable proximal distraction of a lesser trochanter fragment. Intertrochanteric fracture has been reduced. No unexpected finding. IMPRESSION: Fluoroscopy for intertrochanteric right femur fracture fixation. No unexpected finding. Electronically Signed   By: Marnee Spring M.D.   On: 06/17/2016 15:18   Dg Femur, Min 2 Views Right  Result Date: 06/17/2016 CLINICAL DATA:  Right intra medullary nail femur. EXAM: DG C-ARM 61-120  MIN; RIGHT FEMUR 2 VIEWS COMPARISON:  Hip radiography from 2 days ago FINDINGS: Three intraprocedural fluoroscopic images show right femur intra medullary nail with dynamic hip screw. Stable proximal distraction of a lesser trochanter fragment. Intertrochanteric fracture has been reduced. No unexpected finding. IMPRESSION: Fluoroscopy for intertrochanteric right femur fracture fixation. No unexpected finding. Electronically Signed   By: Marnee Spring M.D.   On: 06/17/2016 15:18   Dg Femur Port, Min 2 Views Right  Result Date: 06/18/2016 CLINICAL DATA:  Postoperative radiograph, status post internal fixation of right femoral fracture. Initial encounter. EXAM: RIGHT FEMUR PORTABLE 1 VIEW COMPARISON:  Right femur intraoperative films performed earlier today at 2:50 a.m. FINDINGS: There has been placement of a right femoral intramedullary rod, transfixing the patient's intertrochanteric fracture in grossly anatomic alignment. A displaced lesser trochanteric fragment is again seen. The right femoral head remains seated at the acetabulum. No new fractures are seen. The right knee joint is grossly unremarkable in appearance. No knee joint effusion is seen. Scattered vascular calcifications are noted. A fabella is noted. IMPRESSION: Status post internal fixation of right femoral intertrochanteric fracture in grossly anatomic alignment. No new fracture seen. Displaced lesser trochanteric fragment again noted. Electronically Signed   By: Roanna Raider M.D.   On: 06/18/2016 01:42      No results found for: HGBA1C Lab Results  Component Value Date   CREATININE 0.76 06/16/2016       Scheduled Meds: . aspirin EC  325 mg Oral Q breakfast  . calcium carbonate  1 tablet Oral Daily  . docusate sodium  100 mg Oral BID  . donepezil  10 mg Oral QHS  . memantine  10 mg Oral BID  . multivitamin with minerals  1 tablet Oral Daily  . omega-3 acid ethyl esters  1 capsule Oral BID   Continuous Infusions:      LOS: 2 days    Time spent: >30 MINS    [facility]  [facility] Pager [phone]. If 7PM-7AM, please contact night-coverage at [email or web address], password [facility] 06/18/2016, 9:06 AM  LOS: 2 days

## 2016-06-18 NOTE — Progress Notes (Signed)
Stopped by to see pt and dtr Tami Harris. Pt did well in surgery. Is eating a small amt. Pain appears well controlled. Daughter coping well. PMT to stay involved going forward to assist with disposition options for family as she recuperates from surgery. Hopefully pt will be able to work with PT. Eduard RouxSarah Jamari Moten, ANP

## 2016-06-18 NOTE — Progress Notes (Signed)
Subjective: 1 Day Post-Op Procedure(s) (LRB): INTRAMEDULLARY (IM) NAIL FEMORAL (Right) Patient nonconversive making repetitive grunting noise and saying "huh uh" daughter at bedside said this is new.  Objective: Vital signs in last 24 hours: Temp:  [97.5 F (36.4 C)-99.5 F (37.5 C)] 99.5 F (37.5 C) (09/30 0542) Pulse Rate:  [58-97] 97 (09/30 0542) Resp:  [8-18] 18 (09/30 0542) BP: (66-147)/(48-77) 147/77 (09/30 0542) SpO2:  [94 %-100 %] 100 % (09/30 0542)  Intake/Output from previous day: 09/29 0701 - 09/30 0700 In: 1345 [P.O.:240; I.V.:1050; IV Piggyback:55] Out: 375 [Urine:275; Blood:100] Intake/Output this shift: Total I/O In: 120 [P.O.:120] Out: -    Recent Labs  06/15/16 2305 06/16/16 0548  HGB 12.7 10.7*    Recent Labs  06/15/16 2305 06/16/16 0548  WBC 18.6* 12.6*  RBC 4.37 3.74*  HCT 39.3 32.6*  PLT 229 221    Recent Labs  06/16/16 0548 06/18/16 0923  NA 139 135  K 4.7 4.0  CL 104 100*  CO2 30 26  BUN 19 11  CREATININE 0.76 0.83  GLUCOSE 133* 156*  CALCIUM 8.8* 8.3*    Recent Labs  06/15/16 2355  INR 0.95    Neurovascular intact Sensation intact distally Intact pulses distally Dorsiflexion/Plantar flexion intact Incision: dressing C/D/I Compartment soft  Follows commands dimished distal pulses bilat LE with known ischemic disease, no calf TTP  Assessment/Plan: 1 Day Post-Op Procedure(s) (LRB): INTRAMEDULLARY (IM) NAIL FEMORAL (Right) Up with therapy  wbat RLE may use to transfer if needed I believe had minimal ambulation prior to this injury lovenox 4 weeks dvt proph Pain control as ordered dispo per medicine will continue to follow   Tami Harris 06/18/2016, 2:30 PM

## 2016-06-18 NOTE — Progress Notes (Signed)
OT Cancellation Note  Patient Details Name: Tami HillockHilda B Harris MRN: [id number] DOB: [birth year]   Cancelled Treatment:    Reason Eval/Treat Not Completed: Patient not medically ready;Medical issues which prohibited therapy. Pt with bedrest orders in place since this morning following her surgery on 06/17/16. Will continue to f/u with pt as appropriate.  Margaretmary EddySpencer, Donis Kotowski [facility] 06/18/2016, 12:19 PM

## 2016-06-18 NOTE — Evaluation (Signed)
Physical Therapy Evaluation Patient Details Name: Tami Harris MRN: [id number] DOB: 1924/06/04 Today's Date: 06/18/2016   History of Present Illness  Pt is a 82 y/o female s/p IM nail for R hip fx secondary to fall. 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  Clinical Impression  Pt presented supine in bed with HOB elevated, awake and agreeable to participate in therapy session after nurse administered pain medicine. Difficult to determine exact details of PLOF secondary to pt's cognitive deficits at baseline and no family/caregiver at bedside. Pt admitted from assisted living facility. PT recommending pt d/c to SNF at this time. Pt would continue to benefit from skilled physical therapy services at this time while admitted and after d/c to address her below listed limitations in order to improve her overall safety and independence with functional mobility.     Follow Up Recommendations SNF;Supervision/Assistance - 24 hour    Equipment Recommendations  None recommended by PT    Recommendations for Other Services       Precautions / Restrictions Precautions Precautions: Fall Restrictions Weight Bearing Restrictions: Yes RLE Weight Bearing: Weight bearing as tolerated      Mobility  Bed Mobility Overal bed mobility: Needs Assistance;+2 for physical assistance Bed Mobility: Supine to Sit     Supine to sit: Mod assist;+2 for physical assistance;HOB elevated     General bed mobility comments: pt required increased time, use of bed rails, VC'ing for technique and mod A at bilateral LEs and use of bed pad  Transfers Overall transfer level: Needs assistance Equipment used: 2 person hand held assist Transfers: Sit to/from Stand;Stand Pivot Transfers Sit to Stand: +2 physical assistance;Max assist Stand pivot transfers: +2 physical assistance;Total  assist       General transfer comment: pt required increased time, VC'ing and max A x2 to achieve full standing position. Total A to perform SPT to recliner from bed.  Ambulation/Gait                Stairs            Wheelchair Mobility    Modified Rankin (Stroke Patients Only)       Balance Overall balance assessment: Needs assistance;History of Falls Sitting-balance support: Feet supported;Bilateral upper extremity supported Sitting balance-Leahy Scale: Poor   Postural control: Left lateral lean Standing balance support: During functional activity Standing balance-Leahy Scale: Poor                               Pertinent Vitals/Pain Pain Assessment: Faces Faces Pain Scale: Hurts little more Pain Location: R hip and L foot Pain Descriptors / Indicators: Grimacing;Guarding;Moaning Pain Intervention(s): Monitored during session;Repositioned    Home Living Family/patient expects to be discharged to:: Assisted living                 Additional Comments: Unsure of complete history as pt is not a good historian and there was no family or caregiver at bedside. When asked if she perviously used anything to help her walk, pt stated "I don't know. I think so."    Prior Function Level of Independence: Needs assistance   Gait / Transfers Assistance Needed: unsure secondary to pt unreliable historian and no family/caregiver at bedside           Hand Dominance  Extremity/Trunk Assessment   Upper Extremity Assessment: Generalized weakness           Lower Extremity Assessment: RLE deficits/detail RLE Deficits / Details: pt with decreased strength and ROM limitations secondary to post-op.       Communication   Communication: HOH  Cognition Arousal/Alertness: Awake/alert Behavior During Therapy: Anxious Overall Cognitive Status: History of cognitive impairments - at baseline       Memory: Decreased short-term memory               General Comments      Exercises     Assessment/Plan    PT Assessment Patient needs continued PT services  PT Problem List Decreased strength;Decreased range of motion;Decreased activity tolerance;Decreased balance;Decreased mobility;Decreased coordination;Decreased cognition;Decreased knowledge of use of DME;Decreased safety awareness;Pain          PT Treatment Interventions DME instruction;Gait training;Stair training;Functional mobility training;Therapeutic activities;Therapeutic exercise;Balance training;Neuromuscular re-education;Patient/family education    PT Goals (Current goals can be found in the Care Plan section)  Acute Rehab PT Goals Patient Stated Goal: did not state PT Goal Formulation: Patient unable to participate in goal setting Time For Goal Achievement: 06/25/16 Potential to Achieve Goals: Fair    Frequency Min 3X/week   Barriers to discharge        Co-evaluation               End of Session Equipment Utilized During Treatment: Gait belt Activity Tolerance: Patient limited by pain Patient left: in chair;with call bell/phone within reach;with chair alarm set Nurse Communication: Mobility status         Time: [id number] PT Time Calculation (min) (ACUTE ONLY): 25 min   Charges:   PT Evaluation $PT Eval Moderate Complexity: 1 Procedure PT Treatments $Therapeutic Activity: 8-22 mins   PT G CodesAlessandra Bevels:        Sherril Heyward M Hanah Moultry 06/18/2016, 1:50 PM Deborah ChalkJennifer Siobhan Zaro, PT, DPT [phone]

## 2016-06-18 NOTE — Anesthesia Postprocedure Evaluation (Signed)
Anesthesia Post Note  Patient: Oswald HillockHilda B Orbach  Procedure(s) Performed: Procedure(s) (LRB): INTRAMEDULLARY (IM) NAIL FEMORAL (Right)  Patient location during evaluation: [facility] Anesthesia Type: MAC and Spinal Level of consciousness: awake and awake and alert Pain management: pain level controlled Vital Signs Assessment: post-procedure vital signs reviewed and stable Respiratory status: spontaneous breathing, nonlabored ventilation and respiratory function stable Cardiovascular status: blood pressure returned to baseline Anesthetic complications: no    Last Vitals:  Vitals:   06/17/16 2109 06/18/16 0542  BP: (!) 131/59 (!) 147/77  Pulse: 61 97  Resp: 17 18  Temp: 36.5 C 37.5 C    Last Pain:  Vitals:   06/18/16 0643  TempSrc:   PainSc: Asleep                 Kale Rondeau COKER

## 2016-06-18 NOTE — Progress Notes (Signed)
PT Cancellation Note  Patient Details Name: [name] MRN: [id number] DOB: August 01, 1924   Cancelled Treatment:    Reason Eval/Treat Not Completed: Medical issues which prohibited therapy;Other (comment) (pt with bedrest orders in place following surgery). Pt with bedrest orders in place this morning following her surgery on 06/17/16. PT will check with nursing regarding these orders and continue to f/u with pt as appropriate.   Alessandra BevelsJennifer M Evola Hollis 06/18/2016, 10:43 AM Deborah ChalkJennifer Edith Lord, PT, DPT [phone]

## 2016-06-19 LAB — CBC
HCT: 24.3 % — ABNORMAL LOW (ref 36.0–46.0)
Hemoglobin: 7.8 g/dL — ABNORMAL LOW (ref 12.0–15.0)
MCH: 29 pg (ref 26.0–34.0)
MCHC: 32.1 g/dL (ref 30.0–36.0)
MCV: 90.3 fL (ref 78.0–100.0)
PLATELETS: 189 10*3/uL (ref 150–400)
RBC: 2.69 MIL/uL — ABNORMAL LOW (ref 3.87–5.11)
RDW: 16.8 % — AB (ref 11.5–15.5)
WBC: 10.6 10*3/uL — ABNORMAL HIGH (ref 4.0–10.5)

## 2016-06-19 NOTE — Progress Notes (Signed)
[facility] PROGRESS NOTE  ALYANA KREITER ZOX:[id number] DOB: Nov 14, 1923 DOA: 06/15/2016   PCP: Angela Cox, MD     Assessment/Plan: Principal Problem:   Closed right hip fracture [facility]) Active Problems:   Dementia   Critical lower limb ischemia   PVD (peripheral vascular disease) (HCC)   Hip fracture (HCC)   Goals of care, counseling/discussion   Advance care planning   Palliative care by specialist   Palliative care encounter   82 y.o. female with dementia  from assisted living at [facility] and critical limb ischemia being managed conservatively was brought to the ER after patient had a fall at her living facility. Patient had an unwitnessed fall. Patient was complaining of right hip pain and x-rays done in the ER shows a right hip fracture. On-call orthopedic surgeon Dr. Eulah Pont has been consulted and patient is being admitted for further management. On my exam patient is not in distress and denies any chest pain or shortness of breath.   ED Course: CT head neck are unremarkable. X-rays of the hip show right hip fracture.   Assessment and plan Right hip fracture  status post intramedullary nail on 9/29 by Dr. Eulah Pont - patient has had previous multiple falls.She was recently discharged from the hospital on 04/20/16 for an admission for a radial fracture, also from a fall.  Patient will be at moderate to high risk for surgery given the history of age and comorbidities.   Patient is to receive Dilaudid and Robaxin for uncontrolled pain this morning. Also ordered oxycodone and prn Dilaudid for postop pain control Will need SNF Cbc changed from  12.7>7.8, if lower will transfuse in am     History of dementia on Aricept and Namenda. Patient has mild bradycardia and if heart rate drops further may have to hold dementia medications  Peripheral vascular disease followed by Dr. Nanetta Batty She developed a ischemic ulcer on her left great toe approximately  3 months ago and since that time she has been nonambulatory. She has seen Dr. Leanord Hawking at the wound care center on a weekly basis who has been debriding this. He also has painful. Dopplers performed in our office 03/08/16 right ABI 0.59 and a left appointment 35. She has occluded SFAs bilaterally. She does have dementia and is on medications for this. Intervention would possibly lead to amputation and significant pain until that time versus attempt at angiography in percutaneous revascularization. As per Dr. Allyson Sabal  she is not femoropopliteal candidate given her age and comorbidities.Her quality of life is markedly diminished and she is now nonambulatory and wheelchair bound. Family opted for nonsurgical treatment only Palliative care consulted  , patient would need to be followed by palliative care services at SNF    DVT prophylaxsis to be determined by orthopedics  Code Status:  DO NOT RESUSCITATE    Family Communication: Discussed in detail with the patient's family, all imaging results, lab results explained to the patient   Disposition Plan:  SNF Monday     Consultants:  Orthopedics  Palliative care  Procedures:  None  Antibiotics: Anti-infectives    Start     Dose/Rate Route Frequency Ordered Stop   06/17/16 2000  ceFAZolin (ANCEF) IVPB 2g/100 mL premix     2 g 200 mL/hr over 30 Minutes Intravenous Every 6 hours 06/17/16 1701 06/18/16 0222   06/17/16 1200  ceFAZolin (ANCEF) IVPB 2g/100 mL premix     2 g 200 mL/hr over 30  Minutes Intravenous To [facility] 06/17/16 0835 06/17/16 1422         HPI/Subjective: Resting comfortably, granddaughter by the bedside , confused on and off   Objective: Vitals:   06/18/16 0542 06/18/16 1807 06/18/16 2104 06/19/16 0612  BP: (!) 147/77 (!) 146/47 (!) 142/49 127/60  Pulse: 97 79 83 82  Resp: 18 18 18 18   Temp: 99.5 F (37.5 C) 99.3 F (37.4 C) 99.2 F (37.3 C) 98 F (36.7 C)  TempSrc: Oral Oral Oral Oral  SpO2: 100%  93% 92% 92%  Weight:      Height:        Intake/Output Summary (Last 24 hours) at 06/19/16 1128 Last data filed at 06/19/16 0900  Gross per 24 hour  Intake              285 ml  Output              250 ml  Net               35 ml    Exam:  Examination:  General exam: Appears calm and comfortable  Respiratory system: Clear to auscultation. Respiratory effort normal. Cardiovascular system: S1 & S2 heard, RRR. No JVD, murmurs, rubs, gallops or clicks. No pedal edema. Gastrointestinal system: Abdomen is nondistended, soft and nontender. No organomegaly or masses felt. Normal bowel sounds heard. Central nervous system: Alert and oriented. No focal neurological deficits. Extremities: Symmetric 5 x 5 power.Ulcer on the dorsum of the left foot Skin: No rashes, lesions or ulcers Psychiatry: Judgement and insight appear normal. Mood & affect appropriate.     Data Reviewed: I have personally reviewed following labs and imaging studies  Micro Results Recent Results (from the past 240 hour(s))  MRSA PCR Screening     Status: None   Collection Time: 06/16/16 10:04 AM  Result Value Ref Range Status   MRSA by PCR NEGATIVE NEGATIVE Final    Comment:        The GeneXpert MRSA Assay (FDA approved for NASAL specimens only), is one component of a comprehensive MRSA colonization surveillance program. It is not intended to diagnose MRSA infection nor to guide or monitor treatment for MRSA infections.   Surgical pcr screen     Status: None   Collection Time: 06/17/16  2:00 AM  Result Value Ref Range Status   MRSA, PCR NEGATIVE NEGATIVE Final   Staphylococcus aureus NEGATIVE NEGATIVE Final    Comment:        The Xpert SA Assay (FDA approved for NASAL specimens in patients over 56 years of age), is one component of a comprehensive surveillance program.  Test performance has been validated by [facility] for patients greater than or equal to 74 year old. It is not intended to  diagnose infection nor to guide or monitor treatment.     Radiology Reports Ct Head Wo Contrast  Result Date: 06/16/2016 CLINICAL DATA:  82 year old female with unwitnessed fall EXAM: CT HEAD WITHOUT CONTRAST TECHNIQUE: Contiguous axial images were obtained from the base of the skull through the vertex without intravenous contrast. COMPARISON:  Head CT dated 04/18/2016 FINDINGS: Brain: There is mild age-related atrophy and chronic microvascular ischemic changes. There is no acute intracranial hemorrhage. No mass effect or midline shift noted. No extra-axial fluid collections. Vascular: No hyperdense vessel or unexpected calcification. Skull: Normal. Negative for fracture or focal lesion. Sinuses/Orbits: No acute finding. Other: None IMPRESSION: No acute intracranial hemorrhage. Mild age-related atrophy and chronic microvascular ischemic  disease. If symptoms persist and there are no contraindications, MRI may provide better evaluation if clinically indicated Electronically Signed   By: Elgie CollardArash  Radparvar M.D.   On: 06/16/2016 05:20   Ct Cervical Spine Wo Contrast  Result Date: 06/15/2016 CLINICAL DATA:  Status post fall, with concern for cervical spine injury. Initial encounter. EXAM: CT CERVICAL SPINE WITHOUT CONTRAST TECHNIQUE: Multidetector CT imaging of the cervical spine was performed without intravenous contrast. Multiplanar CT image reconstructions were also generated. COMPARISON:  None. FINDINGS: Alignment: Normal. Skull base and vertebrae: No acute fracture. No primary bone lesion or focal pathologic process. Soft tissues and spinal canal: No prevertebral fluid or swelling. No visible canal hematoma. Disc levels: Mild multilevel disc space narrowing is noted along the mid to lower cervical spine, with small anterior and posterior disc osteophyte complexes. Upper chest: Scarring is noted at the lung apices. An accessory azygos lobe is seen. Mild calcification is noted at the carotid bifurcations  bilaterally. The thyroid gland is unremarkable in appearance. Other: The visualized portions of the brain are unremarkable. IMPRESSION: 1. No evidence of fracture or subluxation along the cervical spine. 2. Minimal degenerative change at the mid to lower cervical spine. 3. Scarring at the lung apices.  Accessory azygos lobe noted. 4. Mild calcification at the carotid bifurcations bilaterally. Carotid ultrasound could be considered for further evaluation, when and as deemed clinically appropriate. Electronically Signed   By: Roanna RaiderJeffery  Chang M.D.   On: 06/15/2016 23:29   Dg Chest Port 1 View  Result Date: 06/16/2016 CLINICAL DATA:  Leukocytosis EXAM: PORTABLE CHEST 1 VIEW COMPARISON:  April 18, 2016 FINDINGS: There is generalized interstitial thickening. There is no frank edema or consolidation. Heart size and pulmonary vascularity are normal. No adenopathy. There is atherosclerotic calcification in the aorta. There is a hiatal hernia. Bones appear somewhat osteoporotic. IMPRESSION: Generalized interstitial thickening without edema or consolidation. Suspect chronic inflammatory type change in the lungs. Stable cardiac silhouette. Hiatal hernia present. There is aortic atherosclerosis. Electronically Signed   By: Bretta BangWilliam  Woodruff III M.D.   On: 06/16/2016 09:26   Dg C-arm 1-60 Min  Result Date: 06/17/2016 CLINICAL DATA:  Right intra medullary nail femur. EXAM: DG C-ARM 61-120 MIN; RIGHT FEMUR 2 VIEWS COMPARISON:  Hip radiography from 2 days ago FINDINGS: Three intraprocedural fluoroscopic images show right femur intra medullary nail with dynamic hip screw. Stable proximal distraction of a lesser trochanter fragment. Intertrochanteric fracture has been reduced. No unexpected finding. IMPRESSION: Fluoroscopy for intertrochanteric right femur fracture fixation. No unexpected finding. Electronically Signed   By: Marnee SpringJonathon  Watts M.D.   On: 06/17/2016 15:18   Dg Hip Unilat With Pelvis 2-3 Views Right  Result Date:  06/15/2016 CLINICAL DATA:  Status post unwitnessed fall, with right hip pain. Initial encounter. EXAM: DG HIP (WITH OR WITHOUT PELVIS) 2-3V RIGHT COMPARISON:  None. FINDINGS: There is a comminuted right femoral intertrochanteric fracture, with a displaced lesser trochanteric fragment, and mild medial and inferior displacement of the distal femur. The right femoral head remains seated at the acetabulum. The left hip joint is grossly unremarkable in appearance. The sacroiliac joints are grossly unremarkable. Mild degenerative change is noted at the lower lumbar spine. The visualized bowel gas pattern is grossly unremarkable. IMPRESSION: Comminuted right femoral intertrochanteric fracture, with a displaced lesser trochanteric fragment, and mild medial and inferior displacement of the distal femur. Electronically Signed   By: Roanna RaiderJeffery  Chang M.D.   On: 06/15/2016 23:31   Dg Femur, Min 2 Views Right  Result  Date: 06/17/2016 CLINICAL DATA:  Right intra medullary nail femur. EXAM: DG C-ARM 61-120 MIN; RIGHT FEMUR 2 VIEWS COMPARISON:  Hip radiography from 2 days ago FINDINGS: Three intraprocedural fluoroscopic images show right femur intra medullary nail with dynamic hip screw. Stable proximal distraction of a lesser trochanter fragment. Intertrochanteric fracture has been reduced. No unexpected finding. IMPRESSION: Fluoroscopy for intertrochanteric right femur fracture fixation. No unexpected finding. Electronically Signed   By: Marnee Spring M.D.   On: 06/17/2016 15:18   Dg Femur Port, Min 2 Views Right  Result Date: 06/18/2016 CLINICAL DATA:  Postoperative radiograph, status post internal fixation of right femoral fracture. Initial encounter. EXAM: RIGHT FEMUR PORTABLE 1 VIEW COMPARISON:  Right femur intraoperative films performed earlier today at 2:50 a.m. FINDINGS: There has been placement of a right femoral intramedullary rod, transfixing the patient's intertrochanteric fracture in grossly anatomic alignment.  A displaced lesser trochanteric fragment is again seen. The right femoral head remains seated at the acetabulum. No new fractures are seen. The right knee joint is grossly unremarkable in appearance. No knee joint effusion is seen. Scattered vascular calcifications are noted. A fabella is noted. IMPRESSION: Status post internal fixation of right femoral intertrochanteric fracture in grossly anatomic alignment. No new fracture seen. Displaced lesser trochanteric fragment again noted. Electronically Signed   By: Roanna Raider M.D.   On: 06/18/2016 01:42     CBC  Recent Labs Lab 06/15/16 2305 06/16/16 0548 06/19/16 0543  WBC 18.6* 12.6* 10.6*  HGB 12.7 10.7* 7.8*  HCT 39.3 32.6* 24.3*  PLT 229 221 189  MCV 89.9 87.2 90.3  MCH 29.1 28.6 29.0  MCHC 32.3 32.8 32.1  RDW 17.0* 16.8* 16.8*  LYMPHSABS 1.9 1.6  --   MONOABS 1.7* 1.5*  --   EOSABS 0.1 0.0  --   BASOSABS 0.0 0.0  --     Chemistries   Recent Labs Lab 06/15/16 2305 06/16/16 0548 06/18/16 0923  NA 140 139 135  K 4.7 4.7 4.0  CL 106 104 100*  CO2 27 30 26   GLUCOSE 129* 133* 156*  BUN 24* 19 11  CREATININE 0.85 0.76 0.83  CALCIUM 9.2 8.8* 8.3*  AST  --  20 22  ALT  --  14 12*  ALKPHOS  --  57 48  BILITOT  --  0.6 0.2*   ------------------------------------------------------------------------------------------------------------------ estimated creatinine clearance is 42.2 mL/min (by C-G formula based on SCr of 0.83 mg/dL). ------------------------------------------------------------------------------------------------------------------ No results for input(s): HGBA1C in the last 72 hours. ------------------------------------------------------------------------------------------------------------------ No results for input(s): CHOL, HDL, LDLCALC, TRIG, CHOLHDL, LDLDIRECT in the last 72 hours. ------------------------------------------------------------------------------------------------------------------ No results  for input(s): TSH, T4TOTAL, T3FREE, THYROIDAB in the last 72 hours.  Invalid input(s): FREET3 ------------------------------------------------------------------------------------------------------------------ No results for input(s): VITAMINB12, FOLATE, FERRITIN, TIBC, IRON, RETICCTPCT in the last 72 hours.  Coagulation profile  Recent Labs Lab 06/15/16 2355  INR 0.95    No results for input(s): DDIMER in the last 72 hours.  Cardiac Enzymes No results for input(s): CKMB, TROPONINI, MYOGLOBIN in the last 168 hours.  Invalid input(s): CK ------------------------------------------------------------------------------------------------------------------ Invalid input(s): POCBNP   CBG: No results for input(s): GLUCAP in the last 168 hours.     Studies: Dg C-arm 1-60 Min  Result Date: 06/17/2016 CLINICAL DATA:  Right intra medullary nail femur. EXAM: DG C-ARM 61-120 MIN; RIGHT FEMUR 2 VIEWS COMPARISON:  Hip radiography from 2 days ago FINDINGS: Three intraprocedural fluoroscopic images show right femur intra medullary nail with dynamic hip screw. Stable proximal distraction of a lesser  trochanter fragment. Intertrochanteric fracture has been reduced. No unexpected finding. IMPRESSION: Fluoroscopy for intertrochanteric right femur fracture fixation. No unexpected finding. Electronically Signed   By: Marnee Spring M.D.   On: 06/17/2016 15:18   Dg Femur, Min 2 Views Right  Result Date: 06/17/2016 CLINICAL DATA:  Right intra medullary nail femur. EXAM: DG C-ARM 61-120 MIN; RIGHT FEMUR 2 VIEWS COMPARISON:  Hip radiography from 2 days ago FINDINGS: Three intraprocedural fluoroscopic images show right femur intra medullary nail with dynamic hip screw. Stable proximal distraction of a lesser trochanter fragment. Intertrochanteric fracture has been reduced. No unexpected finding. IMPRESSION: Fluoroscopy for intertrochanteric right femur fracture fixation. No unexpected finding. Electronically  Signed   By: Marnee Spring M.D.   On: 06/17/2016 15:18   Dg Femur Port, Min 2 Views Right  Result Date: 06/18/2016 CLINICAL DATA:  Postoperative radiograph, status post internal fixation of right femoral fracture. Initial encounter. EXAM: RIGHT FEMUR PORTABLE 1 VIEW COMPARISON:  Right femur intraoperative films performed earlier today at 2:50 a.m. FINDINGS: There has been placement of a right femoral intramedullary rod, transfixing the patient's intertrochanteric fracture in grossly anatomic alignment. A displaced lesser trochanteric fragment is again seen. The right femoral head remains seated at the acetabulum. No new fractures are seen. The right knee joint is grossly unremarkable in appearance. No knee joint effusion is seen. Scattered vascular calcifications are noted. A fabella is noted. IMPRESSION: Status post internal fixation of right femoral intertrochanteric fracture in grossly anatomic alignment. No new fracture seen. Displaced lesser trochanteric fragment again noted. Electronically Signed   By: Roanna Raider M.D.   On: 06/18/2016 01:42      No results found for: HGBA1C Lab Results  Component Value Date   CREATININE 0.83 06/18/2016       Scheduled Meds: . aspirin EC  325 mg Oral Q breakfast  . calcium carbonate  1 tablet Oral Daily  . docusate sodium  100 mg Oral BID  . donepezil  10 mg Oral QHS  . memantine  10 mg Oral BID  . multivitamin with minerals  1 tablet Oral Daily  . omega-3 acid ethyl esters  1 capsule Oral BID   Continuous Infusions:     LOS: 3 days    Time spent: >30 MINS    [facility]  [facility] Pager [phone]. If 7PM-7AM, please contact night-coverage at [email or web address], password [facility] 06/19/2016, 11:28 AM  LOS: 3 days

## 2016-06-19 NOTE — Clinical Social Work Placement (Signed)
   CLINICAL SOCIAL WORK PLACEMENT  NOTE  Date:  06/19/2016  Patient Details  Name: Tami Harris MRN: [id number] Date of Birth: 1924/05/17  Clinical Social Work is seeking post-discharge placement for this patient at the Skilled  Nursing Facility level of care (*CSW will initial, date and re-position this form in  chart as items are completed):  Yes   Patient/family provided with [facility] Clinical Social Work Department's list of facilities offering this level of care within the geographic area requested by the patient (or if unable, by the patient's family).  Yes   Patient/family informed of their freedom to choose among providers that offer the needed level of care, that participate in Medicare, Medicaid or managed care program needed by the patient, have an available bed and are willing to accept the patient.  Yes   Patient/family informed of [facility]'s ownership interest [facility] and [facility], as well as of the fact that they are under no obligation to receive care at these facilities.  PASRR submitted to EDS on       PASRR number received on       Existing PASRR number confirmed on 06/19/16     FL2 transmitted to all facilities in geographic area requested by pt/family on 06/19/16     FL2 transmitted to all facilities within larger geographic area on       Patient informed that his/her managed care company has contracts with or will negotiate with certain facilities, including the following:            Patient/family informed of bed offers received.  Patient chooses bed at       Physician recommends and patient chooses bed at      Patient to be transferred to   on  .  Patient to be transferred to facility by       Patient family notified on   of transfer.  Name of family member notified:        PHYSICIAN Please prepare priority discharge summary, including medications, Please prepare prescriptions, Please sign FL2, Please sign DNR      Additional Comment:    _______________________________________________ Terald Sleeperakiyah T Jewell Ryans, LCSW 06/19/2016, 1:50 PM

## 2016-06-19 NOTE — Progress Notes (Signed)
Subjective: 2 Days Post-Op Procedure(s) (LRB): INTRAMEDULLARY (IM) NAIL FEMORAL (Right) Patient appears more comfortable in the bed resting, spoke with daughter seems to be doing ok more pain with movement.    Objective: Vital signs in last 24 hours: Temp:  [98 F (36.7 C)-99.3 F (37.4 C)] 98 F (36.7 C) (10/01 0612) Pulse Rate:  [79-83] 82 (10/01 0612) Resp:  [18] 18 (10/01 0612) BP: (127-146)/(47-60) 127/60 (10/01 0612) SpO2:  [92 %-93 %] 92 % (10/01 0612)  Intake/Output from previous day: 09/30 0701 - 10/01 0700 In: 225 [P.O.:120; IV Piggyback:105] Out: 250 [Urine:250] Intake/Output this shift: No intake/output data recorded.   Recent Labs  06/19/16 0543  HGB 7.8*    Recent Labs  06/19/16 0543  WBC 10.6*  RBC 2.69*  HCT 24.3*  PLT 189    Recent Labs  06/18/16 0923  NA 135  K 4.0  CL 100*  CO2 26  BUN 11  CREATININE 0.83  GLUCOSE 156*  CALCIUM 8.3*   No results for input(s): LABPT, INR in the last 72 hours.  Incision: dressing C/D/I  Assessment/Plan: 2 Days Post-Op Procedure(s) (LRB): INTRAMEDULLARY (IM) NAIL FEMORAL (Right) Up with therapy  wbat RLE may use to transfer if needed I believe had minimal ambulation prior to this injury lovenox 4 weeks dvt proph Pain control as ordered dispo per medicine will continue to follow looks like snf tomorrow  Margart SicklesChadwell, Kelvin Burpee 06/19/2016, 9:31 AM

## 2016-06-19 NOTE — Clinical Social Work Note (Signed)
Clinical Social Work Assessment  Patient Details  Name: Tami Harris MRN: [id number] Date of Birth: 09/19/1924  Date of referral:  06/16/16               Reason for consult:  Facility Placement                Permission sought to share information with:  Facility Art therapist granted to share information::  Yes, Verbal Permission Granted  Name::        Agency::     Relationship::     Contact Information:     Housing/Transportation Living arrangements for the past 2 months:  [place] of Information:  Patient, Adult Children Patient Interpreter Needed:  None Criminal Activity/Legal Involvement Pertinent to Current Situation/Hospitalization:  No - Comment as needed Significant Relationships:  Adult Children Lives with:  Facility Resident Do you feel safe going back to the place where you live?  No Need for family participation in patient care:  Yes (Comment)  Care giving concerns:  Pt confused, family in room at pt bedside. Pt daughter agreeable to SNF for short-term rehab.    Social Worker assessment / plan: Holiday representative met with pt and pt family and discussed CSW role with discharge planning. CSW also explained PT recommendation for SNF. Pt daughter informed CSW that she pre-registered her mother at [facility] . Pt daughter also inquired information regarding if pt could return back to ALF and get PT at ALF.   CSW will follow up with bed offers once available.   Employment status:  Retired Nurse, adult PT Recommendations:  Not assessed at this time Information / Referral to community resources:  [place]  Patient/Family's Response to care: pt confused.Pt daughter appears happy with care pt is receiving at [facility].   Patient/Family's Understanding of and Emotional Response to Diagnosis, Current Treatment, and Prognosis: Pt's family appears to have a good understanding of  reason for pt admission into the hospital and with pt care plan.   Emotional Assessment Appearance:  Appears younger than stated age Attitude/Demeanor/Rapport:    Affect (typically observed):    Orientation:    Alcohol / Substance use:    Psych involvement (Current and /or in the community):     Discharge Needs  Concerns to be addressed:    Readmission within the last 30 days:    Current discharge risk:    Barriers to Discharge:      Junie Spencer, LCSW 06/19/2016, 1:45 PM

## 2016-06-19 NOTE — NC FL2 (Signed)
MEDICAID FL2 LEVEL OF CARE SCREENING TOOL     IDENTIFICATION  Patient Name: Tami Harris Birthdate: 11/01/1923 Sex: female Admission Date (Current Location): 06/15/2016  [facility] and [place] Number:  Producer, television/film/video and Address:  [facility] [place], [address] [place], [place] [address]      Provider Number: [id number]  Attending Physician Name and Address:  Richarda Overlie, MD  Relative Name and Phone Number:       Current Level of Care: Hospital Recommended Level of Care: Skilled Nursing Facility Prior Approval Number:    Date Approved/Denied:   PASRR Number:   [id number]  Discharge Plan: SNF    Current Diagnoses: Patient Active Problem List   Diagnosis Date Noted  . Palliative care encounter   . Closed right hip fracture (HCC) 06/16/2016  . Hip fracture (HCC) 06/16/2016  . Goals of care, counseling/discussion   . Advance care planning   . Palliative care by specialist   . Fall 04/18/2016  . Distal radius fracture 04/18/2016  . Syncope 04/18/2016  . PVD (peripheral vascular disease) (HCC)   . Critical lower limb ischemia 04/08/2016  . 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 Pneumonia 10/18/2011  . Dementia 10/18/2011    Orientation RESPIRATION BLADDER Height & Weight     Self, Situation, Place  Normal Continent Weight: 149 lb 11.1 oz (67.9 kg) Height:  5\' 6"  (167.6 cm)  BEHAVIORAL SYMPTOMS/MOOD NEUROLOGICAL BOWEL NUTRITION STATUS   (none)  (none) Continent  (regular)  AMBULATORY STATUS COMMUNICATION OF NEEDS Skin   Extensive Assist Verbally Surgical wounds                       Personal Care Assistance Level of Assistance  Bathing, Feeding, Dressing Bathing Assistance: Limited assistance Feeding assistance: Independent Dressing Assistance: Limited assistance     Functional Limitations Info  Hearing, Speech, Sight Sight Info: Adequate Hearing Info: Adequate Speech Info: Adequate    SPECIAL CARE FACTORS  FREQUENCY  PT (By licensed PT)     PT Frequency:  (5x/week)              Contractures Contractures Info: Not present    Additional Factors Info  Code Status, Allergies Code Status Info:  (full) Allergies Info:  (Other, Sulfa Antibiotics, Tape)           Current Medications (06/19/2016):  This is the current hospital active medication list Current Facility-Administered Medications  Medication Dose Route Frequency Provider Last Rate Last Dose  . aspirin EC tablet 325 mg  325 mg Oral Q breakfast Sheral Apley, MD   325 mg at 06/19/16 0857  . bisacodyl (DULCOLAX) suppository 10 mg  10 mg Rectal Daily PRN Sheral Apley, MD      . calcium carbonate (TUMS - dosed in mg elemental calcium) chewable tablet 200 mg of elemental calcium  1 tablet Oral Daily Eduard Clos, MD   200 mg of elemental calcium at 06/19/16 1036  . docusate sodium (COLACE) capsule 100 mg  100 mg Oral BID Sheral Apley, MD   100 mg at 06/19/16 0917  . donepezil (ARICEPT) tablet 10 mg  10 mg Oral QHS Eduard Clos, MD   10 mg at 06/18/16 2022  . HYDROcodone-acetaminophen (NORCO/VICODIN) 5-325 MG per tablet 1-2 tablet  1-2 tablet Oral Q6H PRN Sheral Apley, MD   2 tablet at 06/19/16 0556  . memantine (NAMENDA) tablet 10 mg  10 mg Oral BID  Eduard ClosArshad N Kakrakandy, MD   10 mg at 06/19/16 0917  . menthol-cetylpyridinium (CEPACOL) lozenge 3 mg  1 lozenge Oral PRN Sheral Apleyimothy D Murphy, MD       Or  . phenol (CHLORASEPTIC) mouth spray 1 spray  1 spray Mouth/Throat PRN Sheral Apleyimothy D Murphy, MD      . methocarbamol (ROBAXIN) 500 mg in dextrose 5 % 50 mL IVPB  500 mg Intravenous Q8H PRN Richarda OverlieNayana Abrol, MD   500 mg at 06/18/16 2023  . morphine 2 MG/ML injection 0.5 mg  0.5 mg Intravenous Q2H PRN Eduard ClosArshad N Kakrakandy, MD   0.5 mg at 06/19/16 [id number]  . multivitamin with minerals tablet 1 tablet  1 tablet Oral Daily Eduard ClosArshad N Kakrakandy, MD   1 tablet at 06/19/16 [phone] [id number]  . omega-3 acid ethyl esters (LOVAZA) capsule 1 g  1  capsule Oral BID Eduard ClosArshad N Kakrakandy, MD   1 g at 06/19/16 [id number]     Discharge Medications: Please see discharge summary for a list of discharge medications.  Relevant Imaging Results:  Relevant Lab Results:   Additional Information SS#: [id number]  Terald Sleeperakiyah T Arpan Eskelson, LCSW

## 2016-06-20 ENCOUNTER — Encounter (HOSPITAL_COMMUNITY): Payer: Self-pay | Admitting: Orthopedic Surgery

## 2016-06-20 DIAGNOSIS — F0280 Dementia in other diseases classified elsewhere without behavioral disturbance: Secondary | ICD-10-CM

## 2016-06-20 LAB — CBC
HCT: 22.5 % — ABNORMAL LOW (ref 36.0–46.0)
HEMOGLOBIN: 7.2 g/dL — AB (ref 12.0–15.0)
MCH: 28.8 pg (ref 26.0–34.0)
MCHC: 32 g/dL (ref 30.0–36.0)
MCV: 90 fL (ref 78.0–100.0)
PLATELETS: 205 10*3/uL (ref 150–400)
RBC: 2.5 MIL/uL — AB (ref 3.87–5.11)
RDW: 16.9 % — ABNORMAL HIGH (ref 11.5–15.5)
WBC: 8.6 10*3/uL (ref 4.0–10.5)

## 2016-06-20 LAB — PREPARE RBC (CROSSMATCH)

## 2016-06-20 LAB — ABO/RH: ABO/RH(D): A POS

## 2016-06-20 MED ORDER — SODIUM CHLORIDE 0.9 % IV SOLN
Freq: Once | INTRAVENOUS | Status: AC
  Administered 2016-06-20: 14:00:00 via INTRAVENOUS

## 2016-06-20 NOTE — Progress Notes (Signed)
First unit of 2 packed red blood cells transfusing as ordered by MD.

## 2016-06-20 NOTE — Evaluation (Signed)
Occupational Therapy Evaluation Patient Details Name: Tami Harris MRN: [id number] DOB: 1924-03-04 Today's Date: 06/20/2016    History of Present Illness Pt is a 82 y/o female s/p IM nail for R hip fx secondary to fall. 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   Clinical Impression   Pt admitted with the above diagnosis and has the deficits listed below. Pt would benefit from cont OT to increase independence with basic adls and adl transfers so she can eventually return to her assistive living facility after a SNF stay.  Pt is requiring max to total assist with LE adls at this time and combined with her cognitive deficits requires pt to need more assist than what she was getting at assisted living.  Will continue to see acutely.    Follow Up Recommendations  SNF;Supervision/Assistance - 24 hour    Equipment Recommendations  Other (comment) (TBD at next venue)    Recommendations for Other Services       Precautions / Restrictions Precautions Precautions: Fall Required Braces or Orthoses: Other Brace/Splint Other Brace/Splint: R wrist brace Restrictions Weight Bearing Restrictions: Yes RLE Weight Bearing: Weight bearing as tolerated      Mobility Bed Mobility Overal bed mobility: Needs Assistance;+2 for physical assistance Bed Mobility: Supine to Sit     Supine to sit: Max assist;+2 for physical assistance     General bed mobility comments: pt required increased time, use of bed rails, VC'ing for technique and mod A at bilateral LEs and use of bed pad.  Once EOB, MIN/MOD to scoot hips forward to get feet on floor  Transfers Overall transfer level: Needs assistance Equipment used: Ambulation equipment used Transfers: Sit to/from [employer] Transfers Sit to Stand: Max assist;+2 physical assistance;From elevated surface Stand pivot  transfers: Total assist;+2 safety/equipment       General transfer comment: Stood on 2nd attempt with B UE on stedy and elevated surface.  Pt able to stand for ~30-45 seconds with slight lean to the L.    Balance Overall balance assessment: Needs assistance Sitting-balance support: Feet supported;Bilateral upper extremity supported Sitting balance-Leahy Scale: Fair     Standing balance support: Bilateral upper extremity supported;During functional activity Standing balance-Leahy Scale: Poor                              ADL Overall ADL's : Needs assistance/impaired Eating/Feeding: Set up;Sitting   Grooming: Oral care;Wash/dry face;Wash/dry hands;Set up;Sitting   Upper Body Bathing: Set up;Sitting   Lower Body Bathing: Total assistance;Sit to/from stand   Upper Body Dressing : Supervision/safety;Sitting   Lower Body Dressing: Total assistance;+2 for physical assistance;Sit to/from stand   Toilet Transfer: Maximal assistance;+2 for physical assistance;BSC   Toileting- Clothing Manipulation and Hygiene: Maximal assistance;+2 for physical assistance;Sit to/from Nurse, children's Details (indicate cue type and reason): Pt has not been getting in shower at home due to wound on L foot Functional mobility during ADLs: Maximal assistance;+2 for physical assistance General ADL Comments: Pt overall limited with adls due to pain in L heel and pain in R hip with mobility.       Vision Vision Assessment?: Vision impaired- to be further tested in functional context   Perception     Praxis      Pertinent Vitals/Pain Pain Assessment: Faces  Faces Pain Scale: Hurts a little bit Pain Location: R hip and L foot Pain Descriptors / Indicators: Guarding Pain Intervention(s): Limited activity within patient's tolerance;Monitored during session;Premedicated before session;Repositioned     Hand Dominance Right   Extremity/Trunk Assessment Upper Extremity  Assessment Upper Extremity Assessment: RUE deficits/detail RUE Deficits / Details: Pt broke R wrist in 04/2016 and still in brace.  Unsure of WB status of R wrist at this time.   Lower Extremity Assessment Lower Extremity Assessment: Defer to PT evaluation   Cervical / Trunk Assessment Cervical / Trunk Assessment: Normal   Communication Communication Communication: HOH   Cognition Arousal/Alertness: Awake/alert Behavior During Therapy: WFL for tasks assessed/performed Overall Cognitive Status: History of cognitive impairments - at baseline       Memory: Decreased short-term memory             General Comments       Exercises       Shoulder Instructions      Home Living Family/patient expects to be discharged to:: Skilled nursing facility                             Home Equipment: Walker - 2 wheels          Prior Functioning/Environment Level of Independence: Needs assistance  Gait / Transfers Assistance Needed: Pt walked some with a walker with S and also used w/c to go to dining room ADL's / Homemaking Assistance Needed: Pt able to bathe with S, dress with S and toilet with S.  Pt fed self and groomed Ily.            OT Problem List: Decreased strength;Decreased activity tolerance;Impaired balance (sitting and/or standing);Decreased safety awareness;Decreased cognition;Decreased knowledge of use of DME or AE;Decreased knowledge of precautions;Impaired UE functional use;Pain   OT Treatment/Interventions: Self-care/ADL training;DME and/or AE instruction;Therapeutic activities;Balance training    OT Goals(Current goals can be found in the care plan section) Acute Rehab OT Goals Patient Stated Goal: daughter's goal is for her to return to PLOF OT Goal Formulation: With patient/family Time For Goal Achievement: 07/04/16 Potential to Achieve Goals: Good ADL Goals Pt Will Perform Lower Body Bathing: with min assist;sit to/from stand Pt Will  Perform Lower Body Dressing: with min assist;sit to/from stand Pt Will Transfer to Toilet: with min assist;ambulating;bedside commode Pt Will Perform Toileting - Clothing Manipulation and hygiene: with min assist;sit to/from stand  OT Frequency: Min 2X/week   Barriers to D/C: Decreased caregiver support  Pt in assisted living.  Needs SNF level of care       Co-evaluation PT/OT/SLP Co-Evaluation/Treatment: Yes Reason for Co-Treatment: Complexity of the patient's impairments (multi-system involvement);For patient/therapist safety PT goals addressed during session: Mobility/safety with mobility OT goals addressed during session: ADL's and self-care      End of Session Equipment Utilized During Treatment: Other (comment) (stedy) Nurse Communication: Mobility status;Precautions;Weight bearing status  Activity Tolerance: Patient tolerated treatment well Patient left: in chair;with call bell/phone within reach;with chair alarm set;with family/visitor present   Time: [id number] OT Time Calculation (min): 29 min Charges:  OT General Charges $OT Visit: 1 Procedure OT Evaluation $OT Eval Moderate Complexity: 1 Procedure G-Codes:    Hope BuddsJones, Lakeisa Heninger Anne 06/20/2016, 11:01 AM  [phone]

## 2016-06-20 NOTE — Progress Notes (Signed)
Subjective: 3 Days Post-Op Procedure(s) (LRB): INTRAMEDULLARY (IM) NAIL FEMORAL (Right) Patient conversant - speech clear and organized but does repeat herself. Daughter in room says that her chronic left foot wound seems to cause more soreness.   Not yet OOB.  The patient previously was ambulatory with aid of a walker most of the time. Tolerating diet.  IS to 1000.  Objective: Vital signs in last 24 hours: Temp:  [97.5 F (36.4 C)-98.5 F (36.9 C)] 98.5 F (36.9 C) (10/02 0541) Pulse Rate:  [71-86] 86 (10/02 0541) Resp:  [18] 18 (10/02 0541) BP: (130-144)/(48-57) 130/48 (10/02 0541) SpO2:  [91 %-94 %] 94 % (10/02 0541)  Intake/Output from previous day: 10/01 0701 - 10/02 0700 In: 360 [P.O.:360] Out: 575 [Urine:575] Intake/Output this shift: No intake/output data recorded.   Recent Labs  06/19/16 0543 06/20/16 0606  HGB 7.8* 7.2*    Recent Labs  06/19/16 0543 06/20/16 0606  WBC 10.6* 8.6  RBC 2.69* 2.50*  HCT 24.3* 22.5*  PLT 189 205    Recent Labs  06/18/16 0923  NA 135  K 4.0  CL 100*  CO2 26  BUN 11  CREATININE 0.83  GLUCOSE 156*  CALCIUM 8.3*   No results for input(s): LABPT, INR in the last 72 hours.  Gen: NAD. Daughter at bedside. MSK: Sensation intact distally b/l.  Left foot chronic wound dressing C/D/I Dorsiflexion/Plantar flexion intact Right Lower Extremity Incision: dressing C/D/I and no drainage  Assessment/Plan: 3 Days Post-Op Procedure(s) (LRB): INTRAMEDULLARY (IM) NAIL FEMORAL (Right) Acute blood loss anemia - H/H down again slightly today.  Up with therapy  WBAT RLE  Lovenox recommended for 4 weeks postoperatively after d/c for dvt prophylaxis. Dispo per medicine.  SNF planning in progress.  Tami Harris 06/20/2016, 7:57 AM

## 2016-06-20 NOTE — Progress Notes (Signed)
[facility] PROGRESS NOTE  Tami Harris ZOX:[id number] DOB: 06-08-1924 DOA: 06/15/2016   PCP: Angela Cox, MD     Assessment/Plan: Principal Problem:   Closed right hip fracture [facility]) Active Problems:   Dementia   Critical lower limb ischemia   PVD (peripheral vascular disease) (HCC)   Hip fracture (HCC)   Goals of care, counseling/discussion   Advance care planning   Palliative care by specialist   Palliative care encounter   82 y.o. female with dementia  from assisted living at [facility] and critical limb ischemia being managed conservatively was brought to the ER after patient had a fall at her living facility. Patient had an unwitnessed fall. Patient was complaining of right hip pain and x-rays done in the ER shows a right hip fracture. On-call orthopedic surgeon Dr. Eulah Pont has been consulted and patient is being admitted for further management. On my exam patient is not in distress and denies any chest pain or shortness of breath.   ED Course: CT head neck are unremarkable. X-rays of the hip show right hip fracture.   Assessment and plan Right hip fracture  status post intramedullary nail on 9/29 by Dr. Eulah Pont - patient has had previous multiple falls.She was recently discharged from the hospital on 04/20/16 for an admission for a radial fracture, also from a fall.  Patient will be at moderate to high risk for surgery given the history of age and comorbidities.   Patient is to receive Dilaudid and Robaxin for uncontrolled pain this morning. Also ordered oxycodone and prn Dilaudid for postop pain control Will need SNF Cbc changed from  12.7>7.8>7.2, will transfuse 2 units PRBC     History of dementia on Aricept and Namenda. Patient has mild bradycardia and if heart rate drops further may have to hold dementia medications  Peripheral vascular disease followed by Dr. Nanetta Batty She developed a ischemic ulcer on her left great toe  approximately 3 months ago and since that time she has been nonambulatory. She has seen Dr. Leanord Hawking at the wound care center on a weekly basis who has been debriding this. He also has painful. Dopplers performed in our office 03/08/16 right ABI 0.59 and a left appointment 35. She has occluded SFAs bilaterally. She does have dementia and is on medications for this. Intervention would possibly lead to amputation and significant pain until that time versus attempt at angiography in percutaneous revascularization. As per Dr. Allyson Sabal  she is not femoropopliteal candidate given her age and comorbidities.Her quality of life is markedly diminished and she is now nonambulatory and wheelchair bound. Family opted for nonsurgical treatment only Palliative care consulted  , patient would need to be followed by palliative care services at SNF    DVT prophylaxsis to be determined by orthopedics  Code Status:  DO NOT RESUSCITATE    Family Communication: Discussed in detail with the patient's family, all imaging results, lab results explained to the patient   Disposition Plan:  SNF 10/3     Consultants:  Orthopedics  Palliative care  Procedures:  None  Antibiotics: Anti-infectives    Start     Dose/Rate Route Frequency Ordered Stop   06/17/16 2000  ceFAZolin (ANCEF) IVPB 2g/100 mL premix     2 g 200 mL/hr over 30 Minutes Intravenous Every 6 hours 06/17/16 1701 06/18/16 0222   06/17/16 1200  ceFAZolin (ANCEF) IVPB 2g/100 mL premix     2 g 200 mL/hr over 30 Minutes  Intravenous To [facility] 06/17/16 0835 06/17/16 1422         HPI/Subjective: Resting comfortably, sitting in the chair   Objective: Vitals:   06/19/16 0612 06/19/16 1329 06/19/16 2238 06/20/16 0541  BP: 127/60 (!) 136/48 (!) 144/57 (!) 130/48  Pulse: 82 71 73 86  Resp: 18 18 18 18   Temp: 98 F (36.7 C) 97.8 F (36.6 C) 97.5 F (36.4 C) 98.5 F (36.9 C)  TempSrc: Oral Oral Oral Oral  SpO2: 92% 91% 94% 94%   Weight:      Height:        Intake/Output Summary (Last 24 hours) at 06/20/16 1231 Last data filed at 06/20/16 0005  Gross per 24 hour  Intake              180 ml  Output              575 ml  Net             -395 ml    Exam:  Examination:  General exam: Appears calm and comfortable  Respiratory system: Clear to auscultation. Respiratory effort normal. Cardiovascular system: S1 & S2 heard, RRR. No JVD, murmurs, rubs, gallops or clicks. No pedal edema. Gastrointestinal system: Abdomen is nondistended, soft and nontender. No organomegaly or masses felt. Normal bowel sounds heard. Central nervous system: Alert and oriented. No focal neurological deficits. Extremities: Symmetric 5 x 5 power.Ulcer on the dorsum of the left foot Skin: No rashes, lesions or ulcers Psychiatry: Judgement and insight appear normal. Mood & affect appropriate.     Data Reviewed: I have personally reviewed following labs and imaging studies  Micro Results Recent Results (from the past 240 hour(s))  MRSA PCR Screening     Status: None   Collection Time: 06/16/16 10:04 AM  Result Value Ref Range Status   MRSA by PCR NEGATIVE NEGATIVE Final    Comment:        The GeneXpert MRSA Assay (FDA approved for NASAL specimens only), is one component of a comprehensive MRSA colonization surveillance program. It is not intended to diagnose MRSA infection nor to guide or monitor treatment for MRSA infections.   Surgical pcr screen     Status: None   Collection Time: 06/17/16  2:00 AM  Result Value Ref Range Status   MRSA, PCR NEGATIVE NEGATIVE Final   Staphylococcus aureus NEGATIVE NEGATIVE Final    Comment:        The Xpert SA Assay (FDA approved for NASAL specimens in patients over 70 years of age), is one component of a comprehensive surveillance program.  Test performance has been validated by [facility] for patients greater than or equal to 40 year old. It is not intended to diagnose infection  nor to guide or monitor treatment.     Radiology Reports Ct Head Wo Contrast  Result Date: 06/16/2016 CLINICAL DATA:  82 year old female with unwitnessed fall EXAM: CT HEAD WITHOUT CONTRAST TECHNIQUE: Contiguous axial images were obtained from the base of the skull through the vertex without intravenous contrast. COMPARISON:  Head CT dated 04/18/2016 FINDINGS: Brain: There is mild age-related atrophy and chronic microvascular ischemic changes. There is no acute intracranial hemorrhage. No mass effect or midline shift noted. No extra-axial fluid collections. Vascular: No hyperdense vessel or unexpected calcification. Skull: Normal. Negative for fracture or focal lesion. Sinuses/Orbits: No acute finding. Other: None IMPRESSION: No acute intracranial hemorrhage. Mild age-related atrophy and chronic microvascular ischemic disease. If symptoms persist and there are no  contraindications, MRI may provide better evaluation if clinically indicated Electronically Signed   By: Elgie Collard M.D.   On: 06/16/2016 05:20   Ct Cervical Spine Wo Contrast  Result Date: 06/15/2016 CLINICAL DATA:  Status post fall, with concern for cervical spine injury. Initial encounter. EXAM: CT CERVICAL SPINE WITHOUT CONTRAST TECHNIQUE: Multidetector CT imaging of the cervical spine was performed without intravenous contrast. Multiplanar CT image reconstructions were also generated. COMPARISON:  None. FINDINGS: Alignment: Normal. Skull base and vertebrae: No acute fracture. No primary bone lesion or focal pathologic process. Soft tissues and spinal canal: No prevertebral fluid or swelling. No visible canal hematoma. Disc levels: Mild multilevel disc space narrowing is noted along the mid to lower cervical spine, with small anterior and posterior disc osteophyte complexes. Upper chest: Scarring is noted at the lung apices. An accessory azygos lobe is seen. Mild calcification is noted at the carotid bifurcations bilaterally. The  thyroid gland is unremarkable in appearance. Other: The visualized portions of the brain are unremarkable. IMPRESSION: 1. No evidence of fracture or subluxation along the cervical spine. 2. Minimal degenerative change at the mid to lower cervical spine. 3. Scarring at the lung apices.  Accessory azygos lobe noted. 4. Mild calcification at the carotid bifurcations bilaterally. Carotid ultrasound could be considered for further evaluation, when and as deemed clinically appropriate. Electronically Signed   By: Roanna Raider M.D.   On: 06/15/2016 23:29   Dg Chest Port 1 View  Result Date: 06/16/2016 CLINICAL DATA:  Leukocytosis EXAM: PORTABLE CHEST 1 VIEW COMPARISON:  April 18, 2016 FINDINGS: There is generalized interstitial thickening. There is no frank edema or consolidation. Heart size and pulmonary vascularity are normal. No adenopathy. There is atherosclerotic calcification in the aorta. There is a hiatal hernia. Bones appear somewhat osteoporotic. IMPRESSION: Generalized interstitial thickening without edema or consolidation. Suspect chronic inflammatory type change in the lungs. Stable cardiac silhouette. Hiatal hernia present. There is aortic atherosclerosis. Electronically Signed   By: Bretta Bang III M.D.   On: 06/16/2016 09:26   Dg C-arm 1-60 Min  Result Date: 06/17/2016 CLINICAL DATA:  Right intra medullary nail femur. EXAM: DG C-ARM 61-120 MIN; RIGHT FEMUR 2 VIEWS COMPARISON:  Hip radiography from 2 days ago FINDINGS: Three intraprocedural fluoroscopic images show right femur intra medullary nail with dynamic hip screw. Stable proximal distraction of a lesser trochanter fragment. Intertrochanteric fracture has been reduced. No unexpected finding. IMPRESSION: Fluoroscopy for intertrochanteric right femur fracture fixation. No unexpected finding. Electronically Signed   By: Marnee Spring M.D.   On: 06/17/2016 15:18   Dg Hip Unilat With Pelvis 2-3 Views Right  Result Date:  06/15/2016 CLINICAL DATA:  Status post unwitnessed fall, with right hip pain. Initial encounter. EXAM: DG HIP (WITH OR WITHOUT PELVIS) 2-3V RIGHT COMPARISON:  None. FINDINGS: There is a comminuted right femoral intertrochanteric fracture, with a displaced lesser trochanteric fragment, and mild medial and inferior displacement of the distal femur. The right femoral head remains seated at the acetabulum. The left hip joint is grossly unremarkable in appearance. The sacroiliac joints are grossly unremarkable. Mild degenerative change is noted at the lower lumbar spine. The visualized bowel gas pattern is grossly unremarkable. IMPRESSION: Comminuted right femoral intertrochanteric fracture, with a displaced lesser trochanteric fragment, and mild medial and inferior displacement of the distal femur. Electronically Signed   By: Roanna Raider M.D.   On: 06/15/2016 23:31   Dg Femur, Min 2 Views Right  Result Date: 06/17/2016 CLINICAL DATA:  Right intra medullary  nail femur. EXAM: DG C-ARM 61-120 MIN; RIGHT FEMUR 2 VIEWS COMPARISON:  Hip radiography from 2 days ago FINDINGS: Three intraprocedural fluoroscopic images show right femur intra medullary nail with dynamic hip screw. Stable proximal distraction of a lesser trochanter fragment. Intertrochanteric fracture has been reduced. No unexpected finding. IMPRESSION: Fluoroscopy for intertrochanteric right femur fracture fixation. No unexpected finding. Electronically Signed   By: Marnee SpringJonathon  Watts M.D.   On: 06/17/2016 15:18   Dg Femur Port, Min 2 Views Right  Result Date: 06/18/2016 CLINICAL DATA:  Postoperative radiograph, status post internal fixation of right femoral fracture. Initial encounter. EXAM: RIGHT FEMUR PORTABLE 1 VIEW COMPARISON:  Right femur intraoperative films performed earlier today at 2:50 a.m. FINDINGS: There has been placement of a right femoral intramedullary rod, transfixing the patient's intertrochanteric fracture in grossly anatomic alignment.  A displaced lesser trochanteric fragment is again seen. The right femoral head remains seated at the acetabulum. No new fractures are seen. The right knee joint is grossly unremarkable in appearance. No knee joint effusion is seen. Scattered vascular calcifications are noted. A fabella is noted. IMPRESSION: Status post internal fixation of right femoral intertrochanteric fracture in grossly anatomic alignment. No new fracture seen. Displaced lesser trochanteric fragment again noted. Electronically Signed   By: Roanna RaiderJeffery  Chang M.D.   On: 06/18/2016 01:42     CBC  Recent Labs Lab 06/15/16 2305 06/16/16 0548 06/19/16 0543 06/20/16 0606  WBC 18.6* 12.6* 10.6* 8.6  HGB 12.7 10.7* 7.8* 7.2*  HCT 39.3 32.6* 24.3* 22.5*  PLT 229 221 189 205  MCV 89.9 87.2 90.3 90.0  MCH 29.1 28.6 29.0 28.8  MCHC 32.3 32.8 32.1 32.0  RDW 17.0* 16.8* 16.8* 16.9*  LYMPHSABS 1.9 1.6  --   --   MONOABS 1.7* 1.5*  --   --   EOSABS 0.1 0.0  --   --   BASOSABS 0.0 0.0  --   --     Chemistries   Recent Labs Lab 06/15/16 2305 06/16/16 0548 06/18/16 0923  NA 140 139 135  K 4.7 4.7 4.0  CL 106 104 100*  CO2 27 30 26   GLUCOSE 129* 133* 156*  BUN 24* 19 11  CREATININE 0.85 0.76 0.83  CALCIUM 9.2 8.8* 8.3*  AST  --  20 22  ALT  --  14 12*  ALKPHOS  --  57 48  BILITOT  --  0.6 0.2*   ------------------------------------------------------------------------------------------------------------------ estimated creatinine clearance is 42.2 mL/min (by C-G formula based on SCr of 0.83 mg/dL). ------------------------------------------------------------------------------------------------------------------ No results for input(s): HGBA1C in the last 72 hours. ------------------------------------------------------------------------------------------------------------------ No results for input(s): CHOL, HDL, LDLCALC, TRIG, CHOLHDL, LDLDIRECT in the last 72  hours. ------------------------------------------------------------------------------------------------------------------ No results for input(s): TSH, T4TOTAL, T3FREE, THYROIDAB in the last 72 hours.  Invalid input(s): FREET3 ------------------------------------------------------------------------------------------------------------------ No results for input(s): VITAMINB12, FOLATE, FERRITIN, TIBC, IRON, RETICCTPCT in the last 72 hours.  Coagulation profile  Recent Labs Lab 06/15/16 2355  INR 0.95    No results for input(s): DDIMER in the last 72 hours.  Cardiac Enzymes No results for input(s): CKMB, TROPONINI, MYOGLOBIN in the last 168 hours.  Invalid input(s): CK ------------------------------------------------------------------------------------------------------------------ Invalid input(s): POCBNP   CBG: No results for input(s): GLUCAP in the last 168 hours.     Studies: No results found.    No results found for: HGBA1C Lab Results  Component Value Date   CREATININE 0.83 06/18/2016       Scheduled Meds: . sodium chloride   Intravenous Once  . aspirin  EC  325 mg Oral Q breakfast  . calcium carbonate  1 tablet Oral Daily  . docusate sodium  100 mg Oral BID  . donepezil  10 mg Oral QHS  . memantine  10 mg Oral BID  . multivitamin with minerals  1 tablet Oral Daily  . omega-3 acid ethyl esters  1 capsule Oral BID   Continuous Infusions:     LOS: 4 days    Time spent: >30 MINS    [facility]  [facility] Pager [phone]. If 7PM-7AM, please contact night-coverage at [email or web address], password [facility] 06/20/2016, 12:31 PM  LOS: 4 days

## 2016-06-20 NOTE — Progress Notes (Signed)
First unit of blood completed transfusing with no signs of reaction noted

## 2016-06-20 NOTE — Progress Notes (Signed)
Physical Therapy Treatment Patient Details Name: Tami HillockHilda B Harris MRN: [id number] DOB: [birth year] Today's Date: 06/20/2016    History of Present Illness Pt is a 82 y/o female s/p IM nail for R hip fx secondary to fall. 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    PT Comments    Daughter present during PT session today.  Pt able to tolerate standing with Stedy with slight lean to the L, but Stedy used for transfer bed > chair due to her R hip fx, wound on L foot and recent R wrist fx with brace. Con't to recommend SNF.  Follow Up Recommendations  SNF;Supervision/Assistance - 24 hour     Equipment Recommendations  None recommended by PT    Recommendations for Other Services       Precautions / Restrictions Precautions Precautions: Fall Required Braces or Orthoses: Other Brace/Splint (R wrist brace intact) Restrictions Weight Bearing Restrictions: Yes RLE Weight Bearing: Weight bearing as tolerated    Mobility  Bed Mobility   Bed Mobility: Supine to Sit     Supine to sit: Max assist;+2 for physical assistance     General bed mobility comments: pt required increased time, use of bed rails, VC'ing for technique and mod A at bilateral LEs and use of bed pad.  Once EOB, MIN/MOD to scoot hips forward to get feet on floor  Transfers Overall transfer level: Needs assistance Equipment used: Ambulation equipment used Transfers: Sit to/from BJ'sStand;Stand Pivot Transfers Sit to Stand: Max assist;+2 physical assistance;From elevated surface Stand pivot transfers: Total assist;+2 safety/equipment       General transfer comment: Stood on 2nd attempt with B UE on stedy and elevated surface.  Pt able to stand for ~30-45 seconds with slight lean to the L.  Ambulation/Gait                 Stairs            Wheelchair Mobility    Modified Rankin  (Stroke Patients Only)       Balance Overall balance assessment: History of Falls Sitting-balance support: Feet supported Sitting balance-Leahy Scale: Fair     Standing balance support: Bilateral upper extremity supported Standing balance-Leahy Scale: Poor                      Cognition Arousal/Alertness: Awake/alert Behavior During Therapy: WFL for tasks assessed/performed Overall Cognitive Status: History of cognitive impairments - at baseline       Memory: Decreased short-term memory              Exercises General Exercises - Lower Extremity Ankle Circles/Pumps: AROM;Both;5 reps (Daughter educated on cueing pt to do this with pt) Long Arc Quad: AROM;AAROM;Right;5 reps;Seated    General Comments General comments (skin integrity, edema, etc.): Discussed d/c planning with daughter as well as use of incentive spirometer and re-positioning while in chair      Pertinent Vitals/Pain Pain Assessment: Faces Faces Pain Scale: Hurts a little bit Pain Intervention(s): Premedicated before session;Repositioned    Home Living                      Prior Function            PT Goals (current goals can now be found in the care plan section) Acute Rehab PT Goals Patient Stated Goal: daughter's goal  is for her to return to PLOF PT Goal Formulation: With family Time For Goal Achievement: 06/25/16 Potential to Achieve Goals: Fair Progress towards PT goals: Progressing toward goals    Frequency    Min 3X/week      PT Plan Current plan remains appropriate    Co-evaluation PT/OT/SLP Co-Evaluation/Treatment: Yes Reason for Co-Treatment: For patient/therapist safety PT goals addressed during session: Mobility/safety with mobility       End of Session Equipment Utilized During Treatment: Gait belt;Other (comment) Antony Salmon) Activity Tolerance: Patient tolerated treatment well Patient left: in chair;with call bell/phone within reach;with chair alarm  set;with family/visitor present     Time: 0981-1914 PT Time Calculation (min) (ACUTE ONLY): 30 min  Charges:  $Therapeutic Activity: 8-22 mins                    G Codes:      Tami Harris 06/20/2016, 10:20 AM

## 2016-06-20 NOTE — Care Management Important Message (Signed)
Important Message  Patient Details  Name: Tami Harris MRN: [id number] Date of Birth: [birth year]   Medicare Important Message Given:  Yes    [name] 06/20/2016, 1:48 PM

## 2016-06-21 LAB — CBC
HCT: 34.6 % — ABNORMAL LOW (ref 36.0–46.0)
Hemoglobin: 11.3 g/dL — ABNORMAL LOW (ref 12.0–15.0)
MCH: 28.8 pg (ref 26.0–34.0)
MCHC: 32.7 g/dL (ref 30.0–36.0)
MCV: 88 fL (ref 78.0–100.0)
PLATELETS: 202 10*3/uL (ref 150–400)
RBC: 3.93 MIL/uL (ref 3.87–5.11)
RDW: 16.8 % — AB (ref 11.5–15.5)
WBC: 8 10*3/uL (ref 4.0–10.5)

## 2016-06-21 LAB — TYPE AND SCREEN
ABO/RH(D): A POS
ANTIBODY SCREEN: NEGATIVE
UNIT DIVISION: 0
UNIT DIVISION: 0

## 2016-06-21 MED ORDER — HYDROCODONE-ACETAMINOPHEN 5-325 MG PO TABS: 1.0000 | ORAL_TABLET | Freq: Four times a day (QID) | ORAL | 0 refills | Status: DC | PRN

## 2016-06-21 MED ORDER — BISACODYL 10 MG RE SUPP: 10.0000 mg | Freq: Every day | RECTAL | 0 refills | Status: AC | PRN

## 2016-06-21 MED ORDER — DOCUSATE SODIUM 100 MG PO CAPS: 100.0000 mg | ORAL_CAPSULE | Freq: Two times a day (BID) | ORAL | 0 refills | Status: DC

## 2016-06-21 MED ORDER — FERROUS SULFATE 325 (65 FE) MG PO TABS: 325.0000 mg | ORAL_TABLET | Freq: Two times a day (BID) | ORAL | 0 refills | Status: DC

## 2016-06-21 MED ORDER — ASPIRIN 325 MG PO TBEC: 325.0000 mg | DELAYED_RELEASE_TABLET | Freq: Every day | ORAL | 0 refills | Status: DC

## 2016-06-21 NOTE — Progress Notes (Signed)
Subjective: 4 Days Post-Op Procedure(s) (LRB): INTRAMEDULLARY (IM) NAIL FEMORAL (Right) Patient conversant - Feeling well this morning.  Pain reported as controlled. Up standing at edge of bed yesterday.  The patient previously was ambulatory with aid of a walker most of the time. Tolerating diet. +Urine (foley in place).    Objective: Vital signs in last 24 hours: Temp:  [97.8 F (36.6 C)-98.9 F (37.2 C)] 97.8 F (36.6 C) (10/03 0419) Pulse Rate:  [69-82] 74 (10/03 0419) Resp:  [14-18] 16 (10/03 0419) BP: (117-154)/(48-93) 139/48 (10/03 0419) SpO2:  [96 %-98 %] 96 % (10/03 0419)  Intake/Output from previous day: 10/02 0701 - 10/03 0700 In: 1059 [P.O.:300; I.V.:100; Blood:659] Out: 1000 [Urine:1000] Intake/Output this shift: Total I/O In: 819 [P.O.:60; I.V.:100; Blood:659] Out: 500 [Urine:500]   Recent Labs  06/19/16 0543 06/20/16 0606  HGB 7.8* 7.2*    Recent Labs  06/19/16 0543 06/20/16 0606  WBC 10.6* 8.6  RBC 2.69* 2.50*  HCT 24.3* 22.5*  PLT 189 205    Recent Labs  06/18/16 0923  NA 135  K 4.0  CL 100*  CO2 26  BUN 11  CREATININE 0.83  GLUCOSE 156*  CALCIUM 8.3*   Gen: NAD.  Resting comfortably MSK: Sensation intact distally Dorsiflexion/Plantar flexion intact Right Hip Incisions: dressing C/D/I   Assessment/Plan: 4 Days Post-Op Procedure(s) (LRB): INTRAMEDULLARY (IM) NAIL FEMORAL (Right) Acute blood loss anemia - received blood yesterday.  Up with therapy  WBAT RLE  Dressings: mepilex prn Lovenox recommended for 4 weeks postoperatively after d/c for dvt prophylaxis. Dispo per medicine.  SNF planning in progress. Follow up with Dr. Wandra Feinstein. Murphy in 2 weeks in the office.  Please call with questions.  Tami Harris 06/21/2016, 6:19 AM

## 2016-06-21 NOTE — Progress Notes (Signed)
Pt is being picked up by [facility] for  discharge to [facility] at time of report. Pt condition stable for discharge. Daughter notified of transfer

## 2016-06-21 NOTE — Progress Notes (Signed)
SW received call from nurse stating that the pt's family [place] [facility]. [facility] and informed [facility] that pt is ready now for pick up to [facility].  Nurse Report Number: [phone]   Crista CurbBrittney Cannon [place], MSW

## 2016-06-21 NOTE — Discharge Summary (Addendum)
Physician Discharge Summary  Tami Harris MRN: [id number] DOB/AGE: [birth year] 82 y.o.  PCP: Tami Harris   Admit date: 06/15/2016 Discharge date: 06/22/2016  Discharge Diagnoses:    Principal Problem:   Closed right hip fracture [facility](HCC) Active Problems:   Dementia   Critical lower limb ischemia   PVD (peripheral vascular disease) (HCC)   Hip fracture (HCC)   Goals of care, counseling/discussion   Advance care planning   Palliative care by specialist   Palliative care encounter    Follow-up recommendations Follow-up with PCP in 3-5 days , including all  additional recommended appointments as below Follow-up CBC, CMP in 3-5 days  patient would need to be followed by palliative care services at SNF Follow up with Dr. Wandra Feinstein. Murphy in 2 weeks in the office Aspirin recommended for 4 weeks postoperatively after d/c for dvt prophylaxis, would not use Lovenox given profound anemia requiring blood transfusion    Current Discharge Medication List    START taking these medications   Details  aspirin EC 325 MG EC tablet Take 1 tablet (325 mg total) by mouth daily with breakfast. Qty: 30 tablet, Refills: 0    bisacodyl (DULCOLAX) 10 MG suppository Place 1 suppository (10 mg total) rectally daily as needed for moderate constipation. Qty: 12 suppository, Refills: 0    docusate sodium (COLACE) 100 MG capsule Take 1 capsule (100 mg total) by mouth 2 (two) times daily. Qty: 10 capsule, Refills: 0    ferrous sulfate (FERROUSUL) 325 (65 FE) MG tablet Take 1 tablet (325 mg total) by mouth 2 (two) times daily with a meal. Qty: 60 tablet, Refills: 0    HYDROcodone-acetaminophen (NORCO/VICODIN) 5-325 MG tablet Take 1 tablet by mouth every 6 (six) hours as needed for moderate pain. Qty: 30 tablet, Refills: 0      CONTINUE these medications which have NOT CHANGED   Details  acetaminophen (TYLENOL) 325 MG tablet Take 325 mg by mouth every 6 (six) hours as needed for mild pain or  fever.     calcium carbonate (TUMS - DOSED IN MG ELEMENTAL CALCIUM) 500 MG chewable tablet Chew 1 tablet by mouth daily.    donepezil (ARICEPT) 10 MG tablet Take 1 tablet (10 mg total) by mouth at bedtime.    memantine (NAMENDA) 10 MG tablet Take 1 tablet (10 mg total) by mouth 2 (two) times daily. Qty: 60 tablet, Refills: 3    Multiple Vitamin (MULTIVITAMIN WITH MINERALS) TABS tablet Take 1 tablet by mouth daily.    Omega-3 Fatty Acids (FISH OIL) 1000 MG CAPS Take 1 capsule by mouth 2 (two) times daily.     traMADol (ULTRAM) 50 MG tablet Take 50 mg by mouth every 6 (six) hours as needed for moderate pain.          Discharge Condition:  Stable    Discharge Instructions Get Medicines reviewed and adjusted: Please take all your medications with you for your next visit with your Primary Harris  Please request your Primary Harris to go over all hospital tests and procedure/radiological results at the follow up, please ask your Primary Harris to get all Hospital records sent to his/her office.  If you experience worsening of your admission symptoms, develop shortness of breath, life threatening emergency, suicidal or homicidal thoughts you must seek medical attention immediately by calling 911 or calling your Harris immediately if symptoms less severe.  You must read complete instructions/literature along with all the possible adverse reactions/side effects for all the Medicines you  take and that have been prescribed to you. Take any new Medicines after you have completely understood and accpet all the possible adverse reactions/side effects.   Do not drive when taking Pain medications.   Do not take more than prescribed Pain, Sleep and Anxiety Medications  Special Instructions: If you have smoked or chewed Tobacco in the last 2 yrs please stop smoking, stop any regular Alcohol and or any Recreational drug use.  Wear Seat belts while driving.  Please note  You were cared for by a hospitalist  during your hospital stay. Once you are discharged, your primary care physician will handle any further medical issues. Please note that NO REFILLS for any discharge medications will be authorized once you are discharged, as it is imperative that you return to your primary care physician (or establish a relationship with a primary care physician if you do not have one) for your aftercare needs so that they can reassess your need for medications and monitor your lab values.  Discharge Instructions    Diet - low sodium heart healthy    Complete by:  As directed    Diet - low sodium heart healthy    Complete by:  As directed    Increase activity slowly    Complete by:  As directed    Increase activity slowly    Complete by:  As directed        Allergies  Allergen Reactions  . Other Other (See Comments)    STRONG SMELLS: headaches and sinus issues  . Sulfa Antibiotics Other (See Comments)    Childhood allergy; reaction unknown  . Tape Other (See Comments)    SKIN MAY TEAR      Disposition: 01-Home or Self Care   Consults: * Orthopedics Palliative care     Significant Diagnostic Studies:  Ct Head Wo Contrast  Result Date: 06/16/2016 CLINICAL DATA:  82 year old female with unwitnessed fall EXAM: CT HEAD WITHOUT CONTRAST TECHNIQUE: Contiguous axial images were obtained from the base of the skull through the vertex without intravenous contrast. COMPARISON:  Head CT dated 04/18/2016 FINDINGS: Brain: There is mild age-related atrophy and chronic microvascular ischemic changes. There is no acute intracranial hemorrhage. No mass effect or midline shift noted. No extra-axial fluid collections. Vascular: No hyperdense vessel or unexpected calcification. Skull: Normal. Negative for fracture or focal lesion. Sinuses/Orbits: No acute finding. Other: None IMPRESSION: No acute intracranial hemorrhage. Mild age-related atrophy and chronic microvascular ischemic disease. If symptoms persist and  there are no contraindications, MRI may provide better evaluation if clinically indicated Electronically Signed   By: Elgie Collard M.D.   On: 06/16/2016 05:20   Ct Cervical Spine Wo Contrast  Result Date: 06/15/2016 CLINICAL DATA:  Status post fall, with concern for cervical spine injury. Initial encounter. EXAM: CT CERVICAL SPINE WITHOUT CONTRAST TECHNIQUE: Multidetector CT imaging of the cervical spine was performed without intravenous contrast. Multiplanar CT image reconstructions were also generated. COMPARISON:  None. FINDINGS: Alignment: Normal. Skull base and vertebrae: No acute fracture. No primary bone lesion or focal pathologic process. Soft tissues and spinal canal: No prevertebral fluid or swelling. No visible canal hematoma. Disc levels: Mild multilevel disc space narrowing is noted along the mid to lower cervical spine, with small anterior and posterior disc osteophyte complexes. Upper chest: Scarring is noted at the lung apices. An accessory azygos lobe is seen. Mild calcification is noted at the carotid bifurcations bilaterally. The thyroid gland is unremarkable in appearance. Other: The visualized portions of the  brain are unremarkable. IMPRESSION: 1. No evidence of fracture or subluxation along the cervical spine. 2. Minimal degenerative change at the mid to lower cervical spine. 3. Scarring at the lung apices.  Accessory azygos lobe noted. 4. Mild calcification at the carotid bifurcations bilaterally. Carotid ultrasound could be considered for further evaluation, when and as deemed clinically appropriate. Electronically Signed   By: Roanna Raider M.D.   On: 06/15/2016 23:29   Dg Chest Port 1 View  Result Date: 06/16/2016 CLINICAL DATA:  Leukocytosis EXAM: PORTABLE CHEST 1 VIEW COMPARISON:  April 18, 2016 FINDINGS: There is generalized interstitial thickening. There is no frank edema or consolidation. Heart size and pulmonary vascularity are normal. No adenopathy. There is  atherosclerotic calcification in the aorta. There is a hiatal hernia. Bones appear somewhat osteoporotic. IMPRESSION: Generalized interstitial thickening without edema or consolidation. Suspect chronic inflammatory type change in the lungs. Stable cardiac silhouette. Hiatal hernia present. There is aortic atherosclerosis. Electronically Signed   By: Bretta Bang III M.D.   On: 06/16/2016 09:26   Dg C-arm 1-60 Min  Result Date: 06/17/2016 CLINICAL DATA:  Right intra medullary nail femur. EXAM: DG C-ARM 61-120 MIN; RIGHT FEMUR 2 VIEWS COMPARISON:  Hip radiography from 2 days ago FINDINGS: Three intraprocedural fluoroscopic images show right femur intra medullary nail with dynamic hip screw. Stable proximal distraction of a lesser trochanter fragment. Intertrochanteric fracture has been reduced. No unexpected finding. IMPRESSION: Fluoroscopy for intertrochanteric right femur fracture fixation. No unexpected finding. Electronically Signed   By: Marnee Spring M.D.   On: 06/17/2016 15:18   Dg Hip Unilat With Pelvis 2-3 Views Right  Result Date: 06/15/2016 CLINICAL DATA:  Status post unwitnessed fall, with right hip pain. Initial encounter. EXAM: DG HIP (WITH OR WITHOUT PELVIS) 2-3V RIGHT COMPARISON:  None. FINDINGS: There is a comminuted right femoral intertrochanteric fracture, with a displaced lesser trochanteric fragment, and mild medial and inferior displacement of the distal femur. The right femoral head remains seated at the acetabulum. The left hip joint is grossly unremarkable in appearance. The sacroiliac joints are grossly unremarkable. Mild degenerative change is noted at the lower lumbar spine. The visualized bowel gas pattern is grossly unremarkable. IMPRESSION: Comminuted right femoral intertrochanteric fracture, with a displaced lesser trochanteric fragment, and mild medial and inferior displacement of the distal femur. Electronically Signed   By: Roanna Raider M.D.   On: 06/15/2016 23:31    Dg Femur, Min 2 Views Right  Result Date: 06/17/2016 CLINICAL DATA:  Right intra medullary nail femur. EXAM: DG C-ARM 61-120 MIN; RIGHT FEMUR 2 VIEWS COMPARISON:  Hip radiography from 2 days ago FINDINGS: Three intraprocedural fluoroscopic images show right femur intra medullary nail with dynamic hip screw. Stable proximal distraction of a lesser trochanter fragment. Intertrochanteric fracture has been reduced. No unexpected finding. IMPRESSION: Fluoroscopy for intertrochanteric right femur fracture fixation. No unexpected finding. Electronically Signed   By: Marnee Spring M.D.   On: 06/17/2016 15:18   Dg Femur Port, Min 2 Views Right  Result Date: 06/18/2016 CLINICAL DATA:  Postoperative radiograph, status post internal fixation of right femoral fracture. Initial encounter. EXAM: RIGHT FEMUR PORTABLE 1 VIEW COMPARISON:  Right femur intraoperative films performed earlier today at 2:50 a.m. FINDINGS: There has been placement of a right femoral intramedullary rod, transfixing the patient's intertrochanteric fracture in grossly anatomic alignment. A displaced lesser trochanteric fragment is again seen. The right femoral head remains seated at the acetabulum. No new fractures are seen. The right knee joint is grossly unremarkable  in appearance. No knee joint effusion is seen. Scattered vascular calcifications are noted. A fabella is noted. IMPRESSION: Status post internal fixation of right femoral intertrochanteric fracture in grossly anatomic alignment. No new fracture seen. Displaced lesser trochanteric fragment again noted. Electronically Signed   By: Roanna Raider M.D.   On: 06/18/2016 01:42        Filed Weights   06/16/16 0810 06/16/16 2302  Weight: 63.9 kg (140 lb 14 oz) 67.9 kg (149 lb 11.1 oz)     Microbiology: Recent Results (from the past 240 hour(s))  MRSA PCR Screening     Status: None   Collection Time: 06/16/16 10:04 AM  Result Value Ref Range Status   MRSA by PCR NEGATIVE  NEGATIVE Final    Comment:        The GeneXpert MRSA Assay (FDA approved for NASAL specimens only), is one component of a comprehensive MRSA colonization surveillance program. It is not intended to diagnose MRSA infection nor to guide or monitor treatment for MRSA infections.   Surgical pcr screen     Status: None   Collection Time: 06/17/16  2:00 AM  Result Value Ref Range Status   MRSA, PCR NEGATIVE NEGATIVE Final   Staphylococcus aureus NEGATIVE NEGATIVE Final    Comment:        The Xpert SA Assay (FDA approved for NASAL specimens in patients over 52 years of age), is one component of a comprehensive surveillance program.  Test performance has been validated by [facility] for patients greater than or equal to 20 year old. It is not intended to diagnose infection nor to guide or monitor treatment.        Blood Culture    Component Value Date/Time   SDES URINE, RANDOM 06/15/2011 1033   SPECREQUEST NONE 06/15/2011 1033   CULT ESCHERICHIA COLI 06/15/2011 1033   REPTSTATUS 06/17/2011 FINAL 06/15/2011 1033      Labs: Results for orders placed or performed during the hospital encounter of 06/15/16 (from the past 48 hour(s))  CBC     Status: Abnormal   Collection Time: 06/21/16 10:23 AM  Result Value Ref Range   WBC 8.0 4.0 - 10.5 K/uL   RBC 3.93 3.87 - 5.11 MIL/uL   Hemoglobin 11.3 (L) 12.0 - 15.0 g/dL    Comment: REPEATED TO VERIFY POST TRANSFUSION SPECIMEN    HCT 34.6 (L) 36.0 - 46.0 %   MCV 88.0 78.0 - 100.0 fL   MCH 28.8 26.0 - 34.0 pg   MCHC 32.7 30.0 - 36.0 g/dL   RDW 16.1 (H) 09.6 - 04.5 %   Platelets 202 150 - 400 K/uL     Lipid Panel  No results found for: CHOL, TRIG, HDL, CHOLHDL, VLDL, LDLCALC, LDLDIRECT   No results found for: HGBA1C   Lab Results  Component Value Date   CREATININE 0.83 06/18/2016     82 y.o.femalewith dementia from assisted living at [facility] and critical limb ischemia being managed conservatively was  brought to the ER after patient had a fall at her living facility. Patient had an unwitnessed fall. Patient was complaining of right hip pain and x-rays done in the ER shows a right hip fracture. On-call orthopedic surgeon Dr. Eulah Pont has been consulted and patient is being admitted for further management. On my exam patient is not in distress and denies any chest pain or shortness of breath.  ED Course:CT head neck are unremarkable. X-rays of the hip show right hip fracture.   Hospital  course Right hip fracture status post intramedullary nail on 9/29 by Dr. Eulah Pont - patient has had previous multiple falls.She was recently discharged from the hospital on 04/20/16 for an admission for a radial fracture, also from a fall.  Patient deemed to be at moderate to high risk for surgery given the history of age and comorbidities.   Patient received Dilaudid and Robaxin for uncontrolled pain  . Now has Vicodin for pain control  Will need SNF Cbc changed from  12.7>7.8>7.2, received 2 units of packed red blood cells on 10/2 with the consent of the daughter. Hemoglobin 11.3 prior to discharge Seen by orthopedics this morning Patient conversant - Feeling well this morning.  Pain reported as controlled. Up standing at edge of bed yesterday.  The patient previously was ambulatory with aid of a walker most of the time.   History of dementiaon Aricept and Namenda. Patient has mild bradycardia and if heart rate drops further may have to hold dementia medications   Peripheral vascular disease followed by Dr. Nanetta Batty She developed a ischemic ulcer on her left great toe approximately 3 months ago and since that time she has been nonambulatory. She has seen Dr. Leanord Hawking at the wound care center on a weekly basis who has been debriding this. He also has painful. Dopplers performed in our office 03/08/16 right ABI 0.59 and a left appointment 35. She has occluded SFAs bilaterally. She does have dementia and  is on medications for this. Intervention would possibly lead to amputation and significant pain until that time versus attempt at angiography in percutaneous revascularization. As per Dr. Allyson Sabal  she is not femoropopliteal candidate given her age and comorbidities.Her quality of life is markedly diminished and she is now nonambulatory and wheelchair bound. Family opted for nonsurgical treatment only Palliative care consulted  , patient would need to be followed by palliative care services at SNF     Wound care-ulcer on the dorsum of the left toe  Cleanse with NS, pat dry, apply wound hydrogel and cover with NS moistened gauze, dry gauze, wrap with Kling, perform daily.   Discharge Exam:   Blood pressure 111/85, pulse 72, temperature 98.3 F (36.8 C), temperature source Oral, resp. rate 18, height 5\' 6"  (1.676 m), weight 67.9 kg (149 lb 11.1 oz), SpO2 95 %.      Follow-up Information    MURPHY, TIMOTHY D, Harris. Schedule an appointment as soon as possible for a visit in 10 day(s).   Specialty:  Orthopedic Surgery Contact information: [address] [place] [address] [phone]        PCP. Schedule an appointment as soon as possible for a visit in 2 day(s).   Why:  Hospital follow-up          Signed: Pamella Pert 06/22/2016, 4:22 PM     No changes to the D/C summary as documented by Dr. Susie Cassette. OK to discharge   Time spent >45 mins

## 2016-06-21 NOTE — H&P (Signed)
Report called and given to receiving facility nurse 

## 2016-06-21 NOTE — Progress Notes (Signed)
[facility] to give her more information regarding authorization. SW referred pt [facility] around 12:15 and did not get a call back until 5:08. SW reached back out to representative at 5:12 to give requested verbal information. However, it appears as if the pt is gone gone for the day.   SW spoke with facility. The pt cannot come until the authorization number is obtained. SW made nurse aware that the pt will more than likely not have a number back until tomorrow morning.  Crista CurbBrittney Juno Bozard, MSW [phone]

## 2016-06-21 NOTE — Progress Notes (Signed)
Clinical social paged as pt's daughter is here and she says [facility] and is just waiting for the go ahead to take her mom there

## 2016-06-21 NOTE — Progress Notes (Signed)
SW has spoke with daughter who states she [facility]. SW informed facility/Admissions who states that pt is welcomed to come today. However, daughter would need to be at the facility by 2:30. SW informed daughter who states that she will go to facility at 2:30 to do the required paperwork. SW made facility admissions aware.  SW reached sent the required clinicals to [facility] to obtain an authorization number. SW will continue to follow up.   Crista CurbBrittney Breean Nannini, MSW [phone] 06/21/2016 12:20 PM

## 2016-06-21 NOTE — Progress Notes (Signed)
Discharge to [facility] cancelled due to clearance not being given. Pt will be discharged tomorrow.

## 2016-06-22 NOTE — Progress Notes (Signed)
Nutrition Follow-up  DOCUMENTATION CODES:   Not applicable  INTERVENTION:   -Ensure Enlive po BID, each supplement provides 350 kcal and 20 grams of protein  NUTRITION DIAGNOSIS:   Inadequate oral intake related to lethargy/confusion as evidenced by meal completion < 50%.  Ongoing  GOAL:   Patient will meet greater than or equal to 90% of their needs  Progressing  MONITOR:   PO intake, Supplement acceptance, Labs, Weight trends, Skin, I & O's  REASON FOR ASSESSMENT:   Consult Hip fracture protocol  ASSESSMENT:   82 y.o. female with dementia and critical limb ischemia being managed conservatively was brought to the ER after patient had a fall at her living facility. Patient had an unwitnessed fall. Patient was complaining of right hip pain and x-rays done in the ER shows a right hip fracture.  S/p Procedure(s) (LRB) on 06/17/16: INTRAMEDULLARY (IM) NAIL FEMORAL (Right)  No family present at time of visit. Pt sleeping soundly and did not arouse to pt voice.   Pt now on a regular diet. Noted documented meal completion variable (PO: 25-75%), however, pt consumed only a few bites of oatmeal per observation of breakfast tray. Given drowsiness and variable intake, pt may benefit from nutritional supplements.   Palliative care team following; plan for palliative care follow-up at next venue of care.   Reviewed CSW notes; pt is pending discharge to [facility] and awaiting authorization prior to transfer.   Labs reviewed.   Diet Order:  Diet regular Room service appropriate? Yes; Fluid consistency: Thin Diet - low sodium heart healthy Diet - low sodium heart healthy  Skin:  Reviewed, no issues  Last BM:  06/15/16  Height:   Ht Readings from Last 1 Encounters:  06/16/16 5\' 6"  (1.676 m)    Weight:   Wt Readings from Last 1 Encounters:  06/16/16 149 lb 11.1 oz (67.9 kg)    Ideal Body Weight:  59 kg  BMI:  Body mass index is 24.16 kg/m.  Estimated  Nutritional Needs:   Kcal:  1600-1800  Protein:  70-85 grams  Fluid:  1.6-1.8 L/day  EDUCATION NEEDS:   No education needs identified at this time  Labarron Durnin A. Mayford KnifeWilliams, RD, LDN, CDE Pager: [phone] After hours Pager: [phone]

## 2016-06-22 NOTE — Care Management Important Message (Signed)
Important Message  Patient Details  Name: Tami Harris MRN: [id number] Date of Birth: November 20, 1923   Medicare Important Message Given:  Yes    Jaxsin Bottomley Abena 06/22/2016, 11:17 AM

## 2016-06-22 NOTE — Progress Notes (Signed)
SW has spoke with Tami Harris representative  [facility] who states that she had to send the pt's medical paperwork to her medical director.   SW asked representative if she needed any more information from her. However, she states that she does not need anything and she got all the required neccessary documents yesterday. She states she will call SW back when an authorization is available.   SW will continue to follow up.   Crista CurbBrittney Ericah Scotto, MSW [phone] 06/22/2016 10:10 AM

## 2016-06-22 NOTE — Progress Notes (Signed)
SW reached out to daughter to inform her that the pt's insurance has not provided an authorization number, and that the pt is up for d/c today. SW asked daughter would she be able to pay privately.   SW reached out to admissions in order to get a price estimate and inform daughter.  Crista CurbBrittney Lusine Corlett, MSW [phone]

## 2016-06-22 NOTE — Progress Notes (Signed)
SW has arranged transportation and has confirmed with  [facility] admissions that pt is welcomed to come. SW made nurse aware that transportation has been called as well as daughter.  Crista CurbBrittney Neyra Pettie, MSW

## 2016-06-22 NOTE — Progress Notes (Signed)
No charge note:   Spoke with patient's daughter, Annice PihJackie- patient being discharged to [facility]. Pain is well managed by current regimen. Hopeful for palliative services to followup at [facility].   Ocie BobKasie Mahan, AGNP-C Palliative Medicine  Please call Palliative Medicine team phone with any questions [phone]. For individual providers please see AMION.

## 2016-06-22 NOTE — Progress Notes (Signed)
Patient seen and discussed with daughter today. No changes, to be d/c to SNF today, refer to d/c summary by Dr. Harrie JeansAbrol  Tami M. Elvera LennoxGherghe, MD [facility] [phone]

## 2016-08-01 ENCOUNTER — Encounter (HOSPITAL_BASED_OUTPATIENT_CLINIC_OR_DEPARTMENT_OTHER): Payer: Medicare Other | Attending: Internal Medicine

## 2016-08-01 DIAGNOSIS — I70245 Atherosclerosis of native arteries of left leg with ulceration of other part of foot: Secondary | ICD-10-CM | POA: Diagnosis not present

## 2016-08-01 DIAGNOSIS — L97522 Non-pressure chronic ulcer of other part of left foot with fat layer exposed: Secondary | ICD-10-CM | POA: Diagnosis not present

## 2016-08-15 DIAGNOSIS — I70245 Atherosclerosis of native arteries of left leg with ulceration of other part of foot: Secondary | ICD-10-CM | POA: Diagnosis not present

## 2016-09-15 ENCOUNTER — Ambulatory Visit: Payer: Medicare Other | Admitting: Neurology

## 2016-11-06 IMAGING — CR DG CHEST 2V
2 series · 2 of 2 positions shown · non-contrast
Comparison: Radiograph October 18, 2011.

CLINICAL DATA: Preop.

EXAM:
CHEST  2 VIEW

[w chest lat]
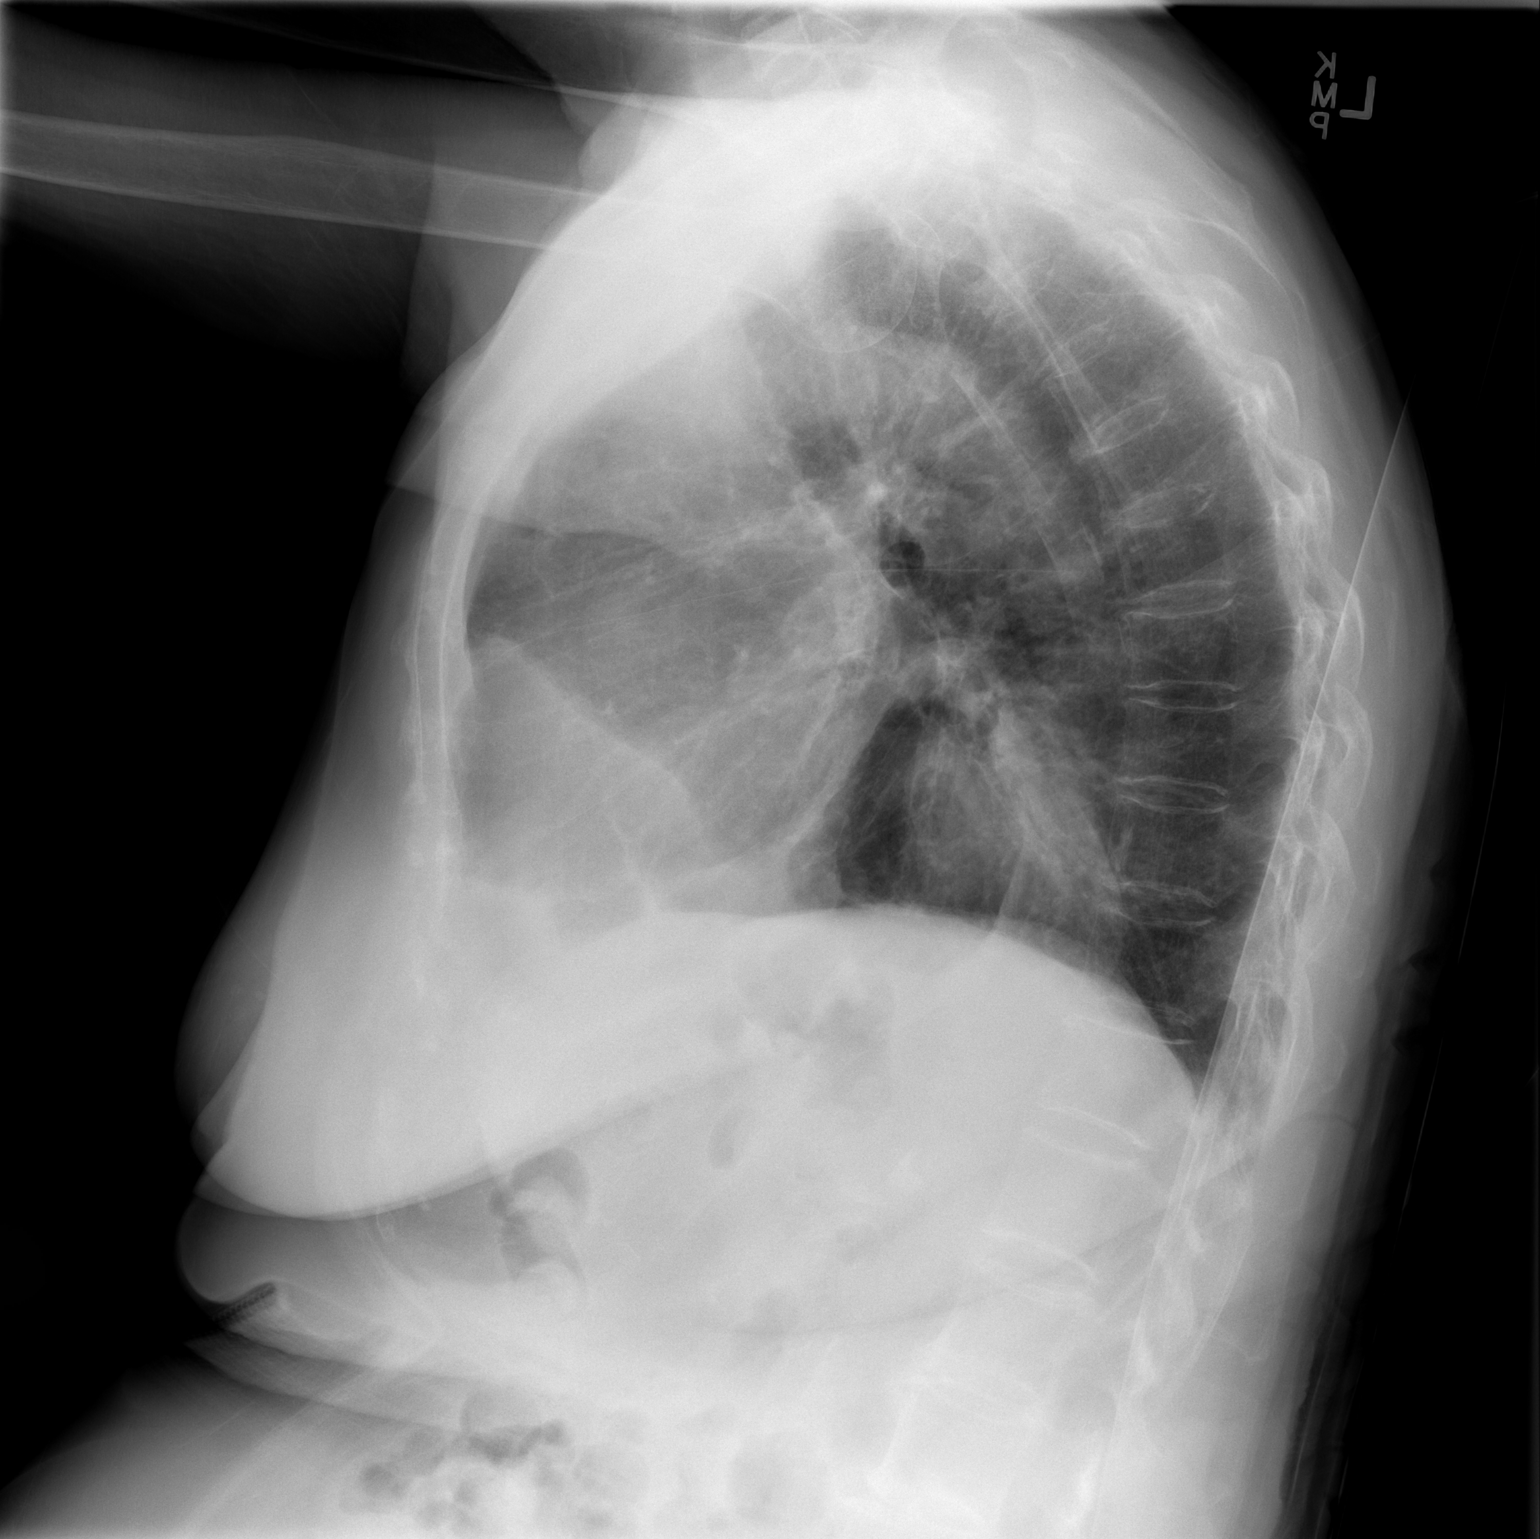

[w chest ap]
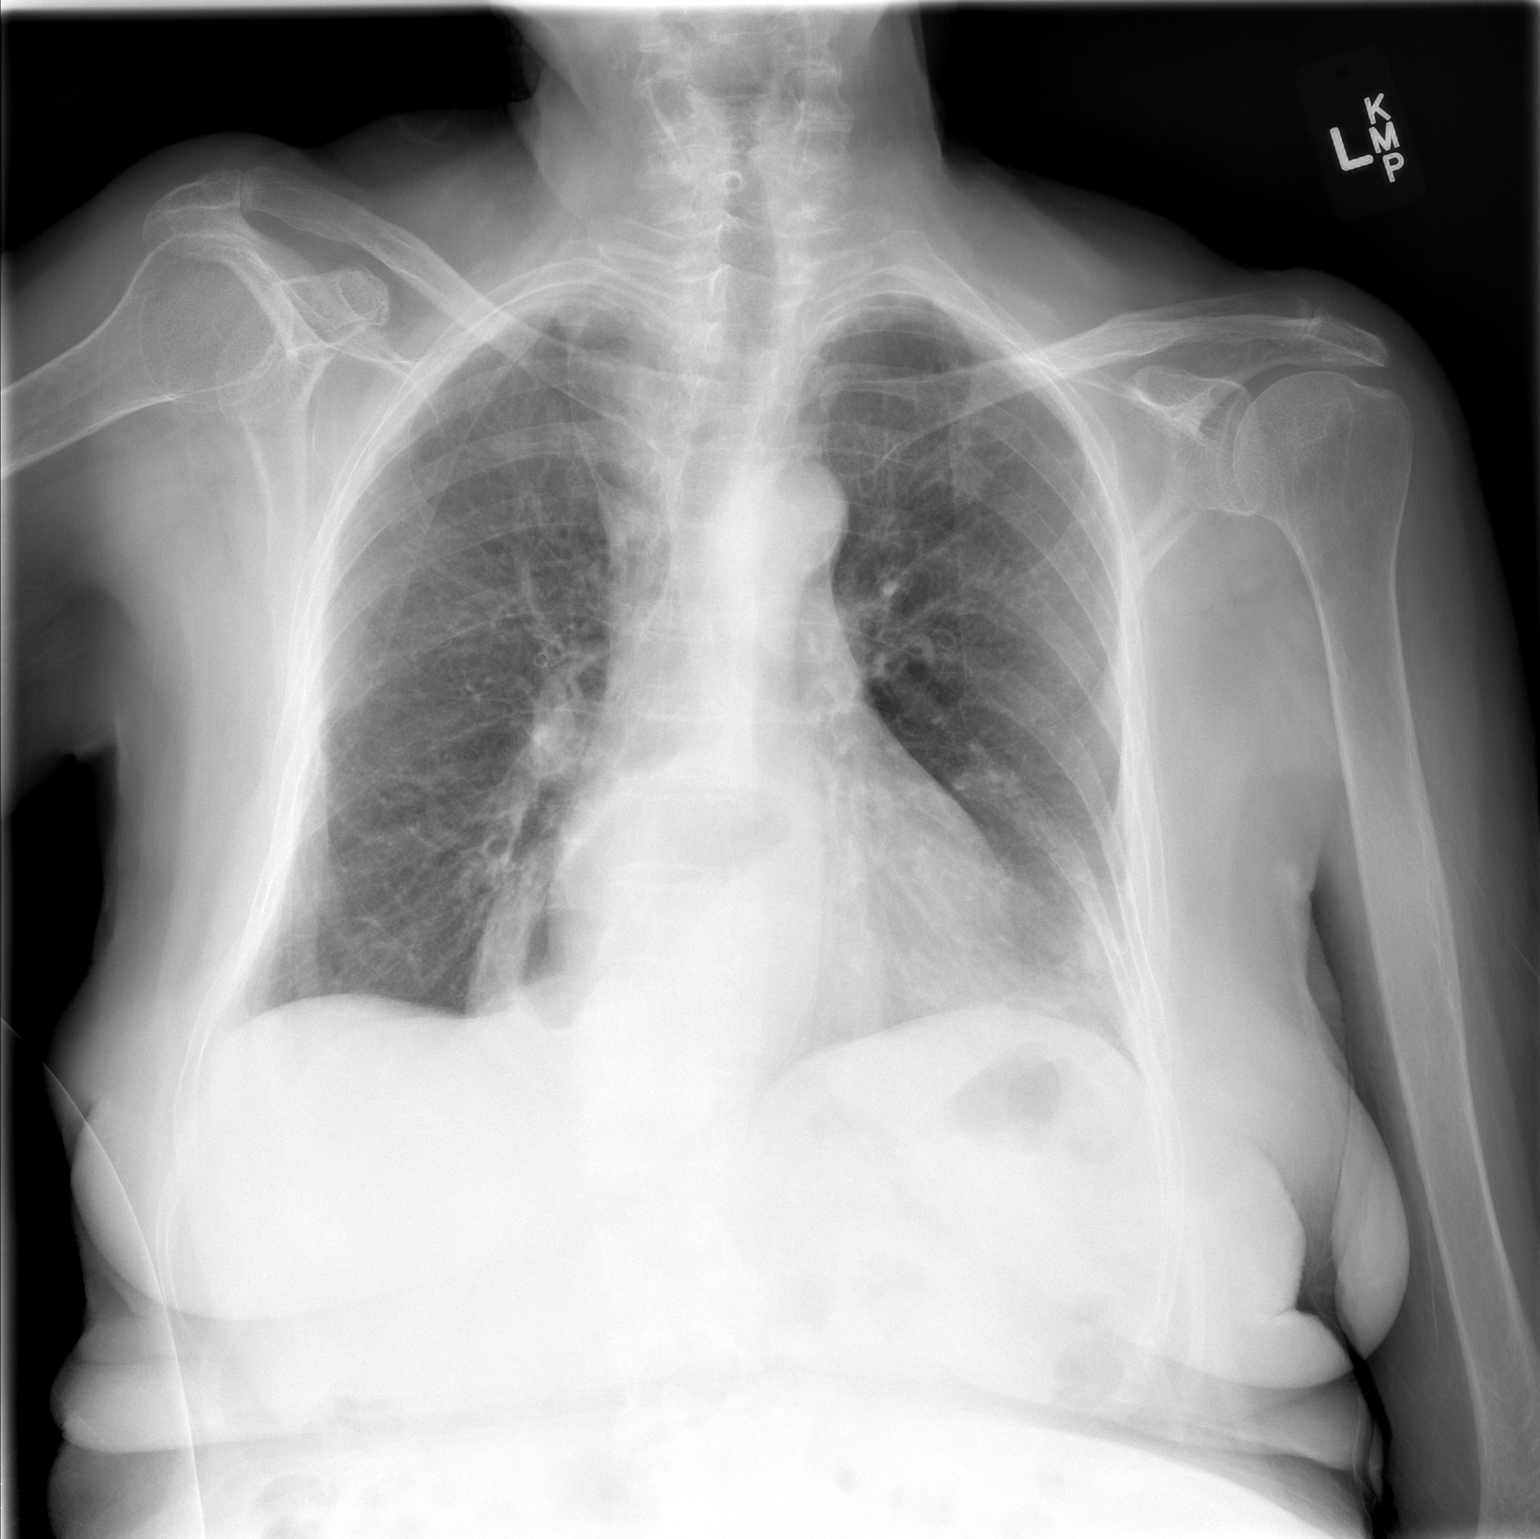

[2 of 2 positions shown; findings below may reference images not displayed]

FINDINGS: The heart size and mediastinal contours are within normal limits.
Both lungs are clear. Atherosclerosis thoracic aorta is noted. Large
hiatal hernia is noted. No pneumothorax or pleural effusion is
noted. The visualized skeletal structures are unremarkable.
IMPRESSION: No active cardiopulmonary disease. Aortic atherosclerosis. Large
hiatal hernia.

## 2016-11-16 IMAGING — CT CT HEAD W/O CM
3 series · 15 of 45 positions shown, 18 images · non-contrast
Comparison: June 15, 2011

CLINICAL DATA: Fall.  Abrasion to forehead.

EXAM:
CT HEAD WITHOUT CONTRAST
TECHNIQUE: Contiguous axial images were obtained from the base of the skull
through the vertex without intravenous contrast.

[Series 2: head 5.0 h30s · axial · 0.43mm/px · z∈[-117,-2]mm · 9 of 28 slices shown, 12 images]
[im 3/28  brain]
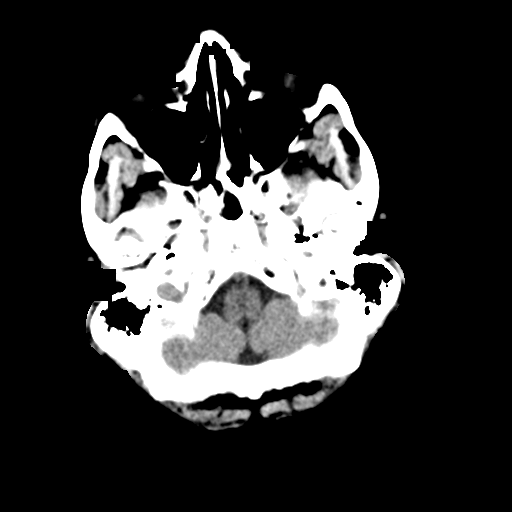
[im 3/28  bone]
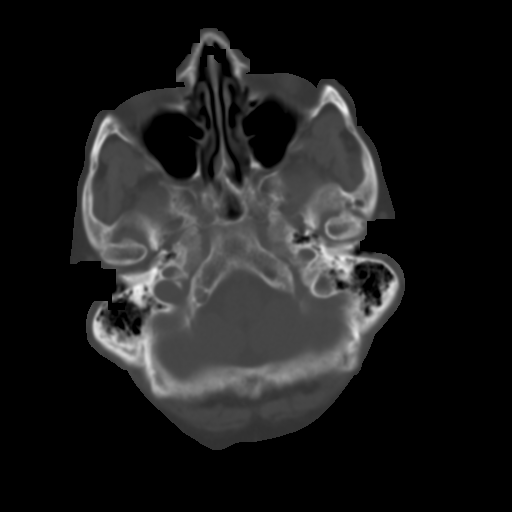
[im 6/28  brain]
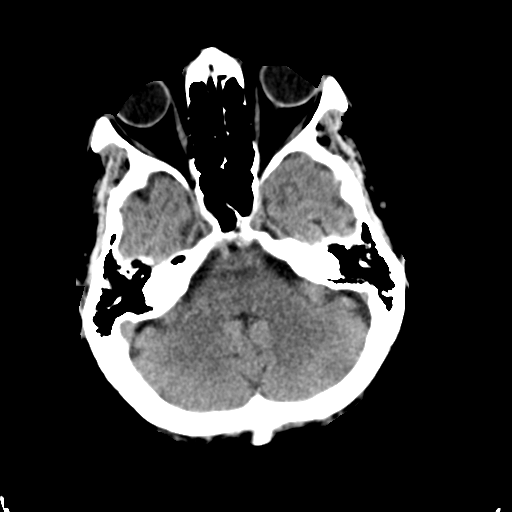
[im 9/28  brain]
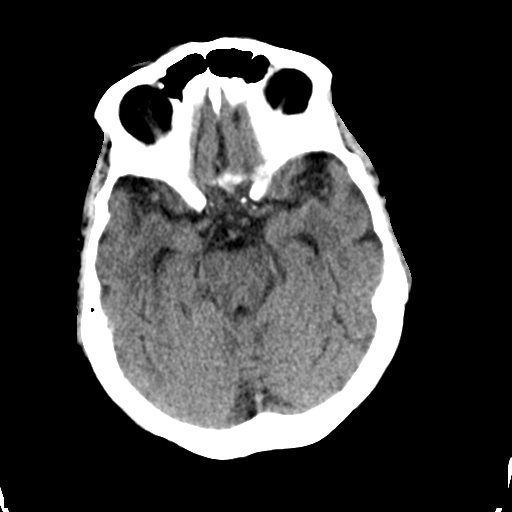
[im 12/28  brain]
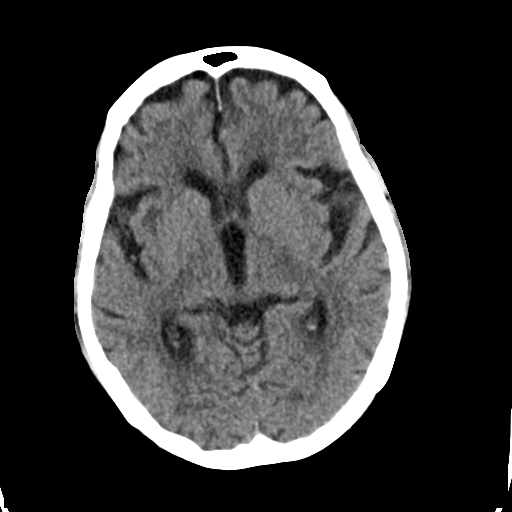
[im 15/28  brain]
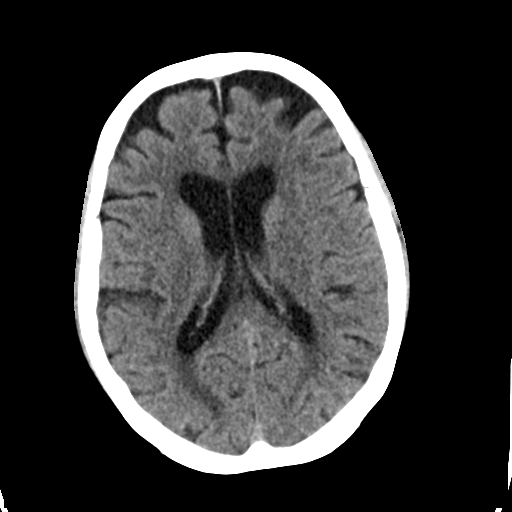
[im 15/28  bone]
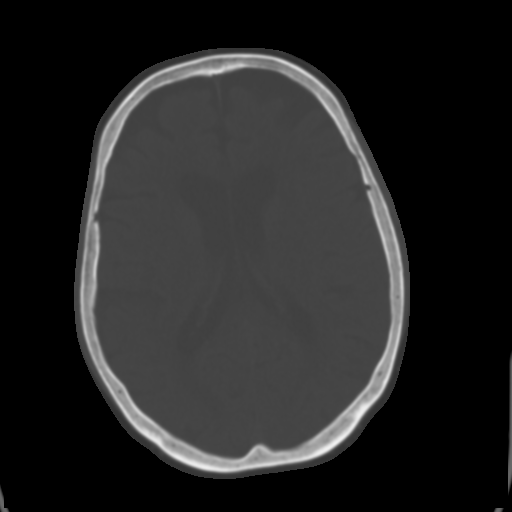
[im 17/28  brain]
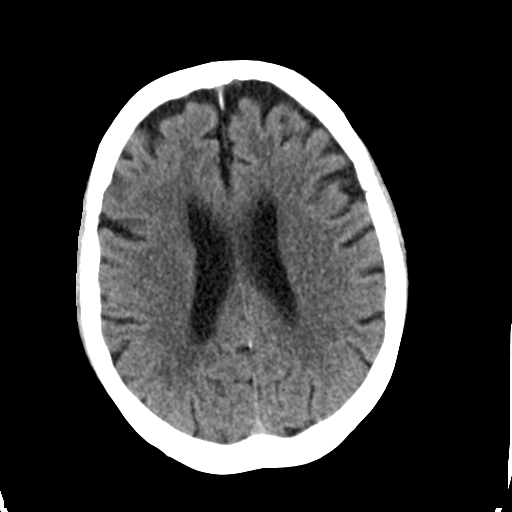
[im 20/28  brain]
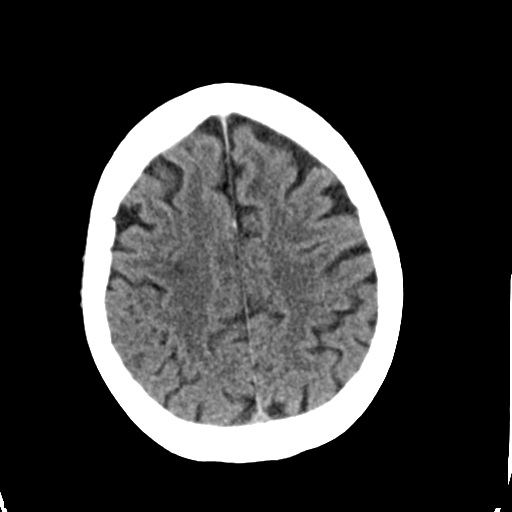
[im 23/28  brain]
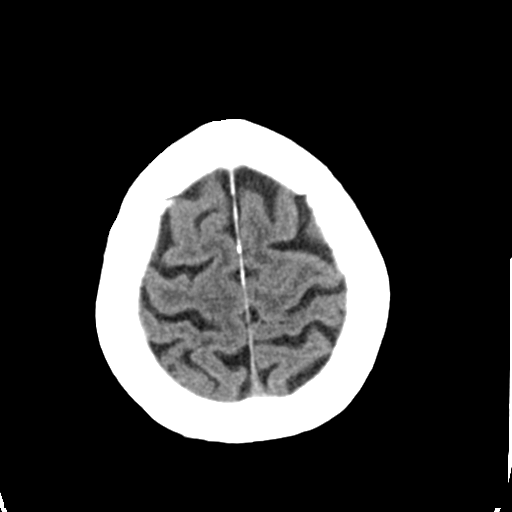
[im 26/28  brain]
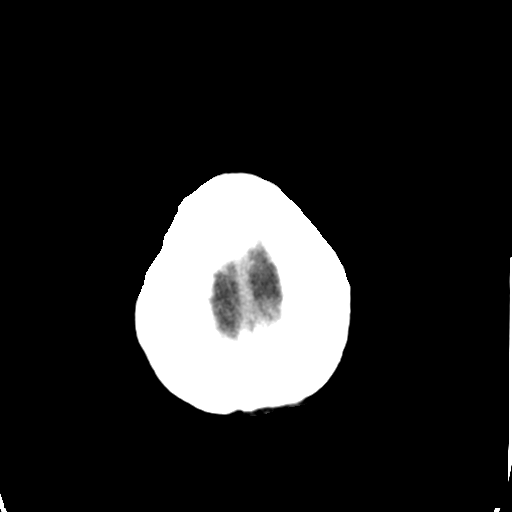
[im 26/28  bone]
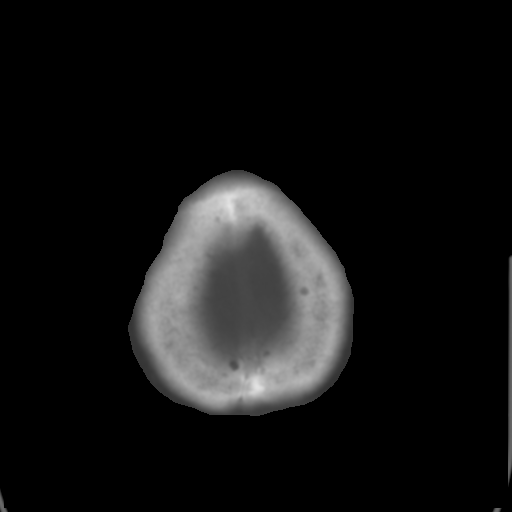

[Series 4: head 3.0 mpr · coronal · 0.29mm/px · 3 of 61 slices shown (1 of 2)]
[im 21/61  brain]
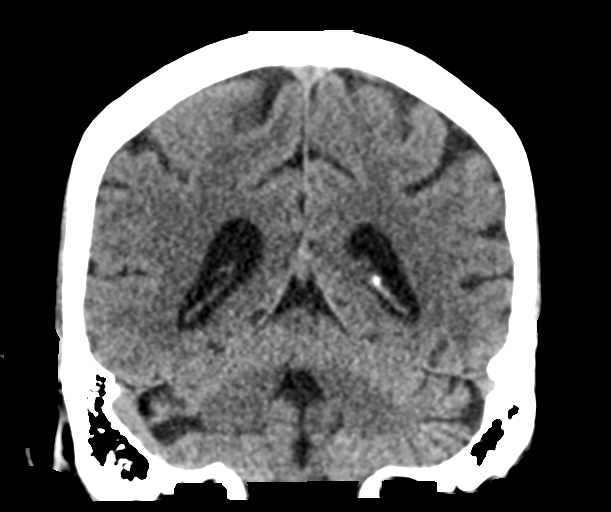
[im 27/61  brain]
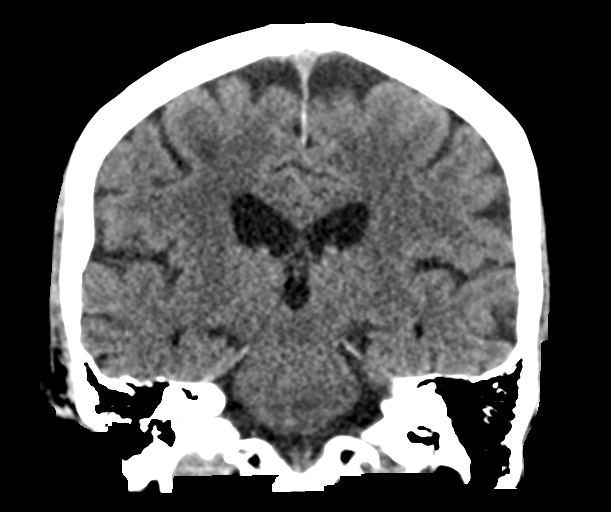
[im 34/61  brain]
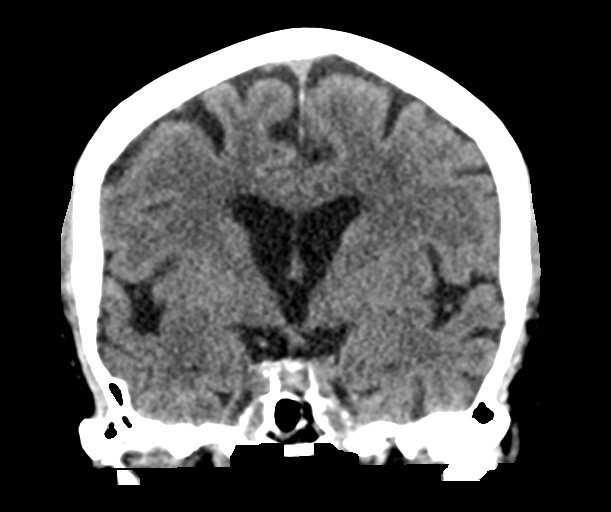

[Series 5: head 3.0 mpr · sagittal · 0.31mm/px · 3 of 51 slices shown (2 of 2)]
[im 17/51  brain]
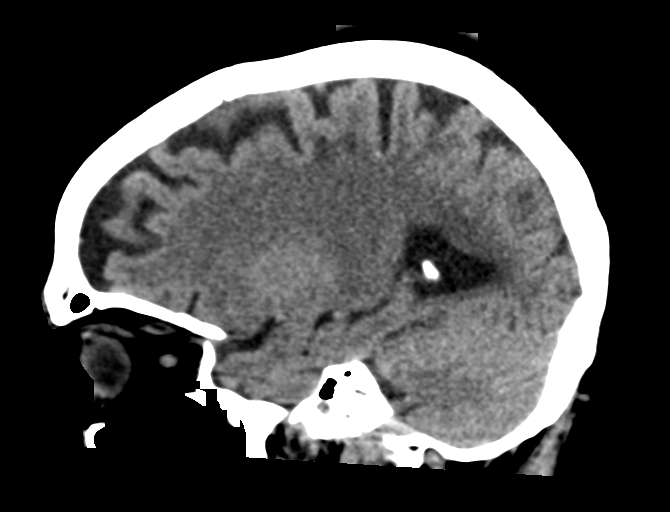
[im 26/51  brain]
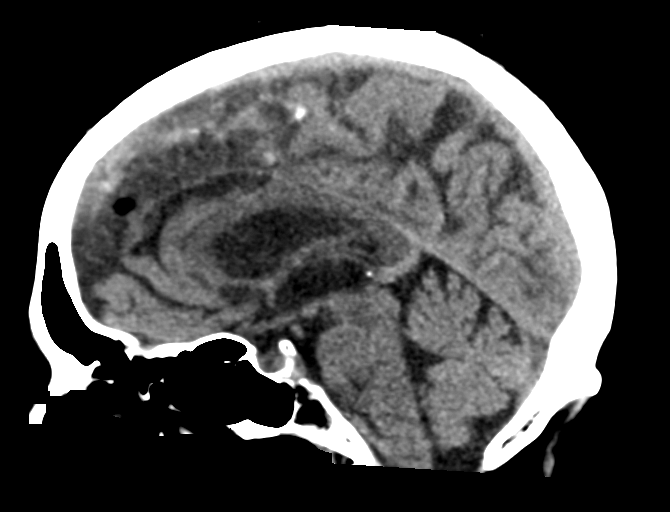
[im 34/51  brain]
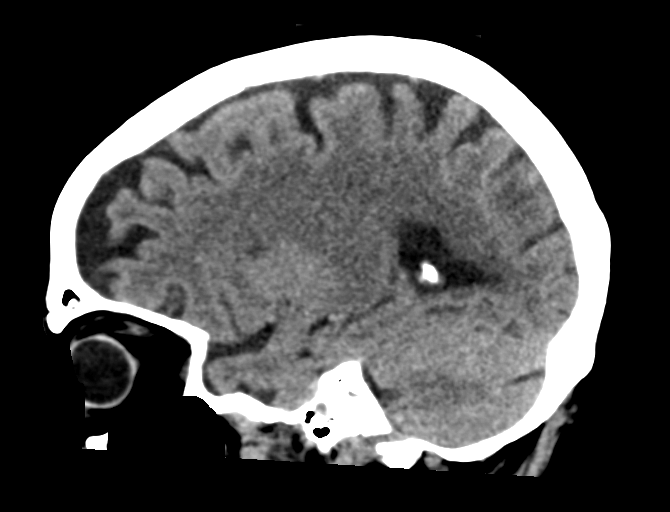

[15 of 45 positions shown; findings below may reference images not displayed]

FINDINGS: Paranasal sinuses, mastoid air cells, bones, and extracranial soft
tissues images. No subdural, epidural, or subarachnoid hemorrhage.
Ventricles and sulci are prominent but stable. Cerebellum,
brainstem, and basal cisterns are normal. No mass, mass effect, or
midline shift. Scattered white matter changes with no evidence of
acute cortical ischemia or infarct.
IMPRESSION: Mild white matter changes.  No acute intracranial process.

## 2016-12-12 ENCOUNTER — Telehealth: Payer: Self-pay | Admitting: Neurology

## 2016-12-12 NOTE — Telephone Encounter (Signed)
[employer] called and needs recent notes pertaining to a wheel chair/Dawn CB# [id number] [phone] [id number]

## 2016-12-13 NOTE — Telephone Encounter (Signed)
Left message that this patient has not been seen since June of 2017 and that I did not have a note about her getting a wheelchair.

## 2017-01-14 IMAGING — DX DG CHEST 1V PORT
1 series · 1 of 1 positions shown · non-contrast
Comparison: April 18, 2016

CLINICAL DATA: Leukocytosis

EXAM:
PORTABLE CHEST 1 VIEW

[chest ap]
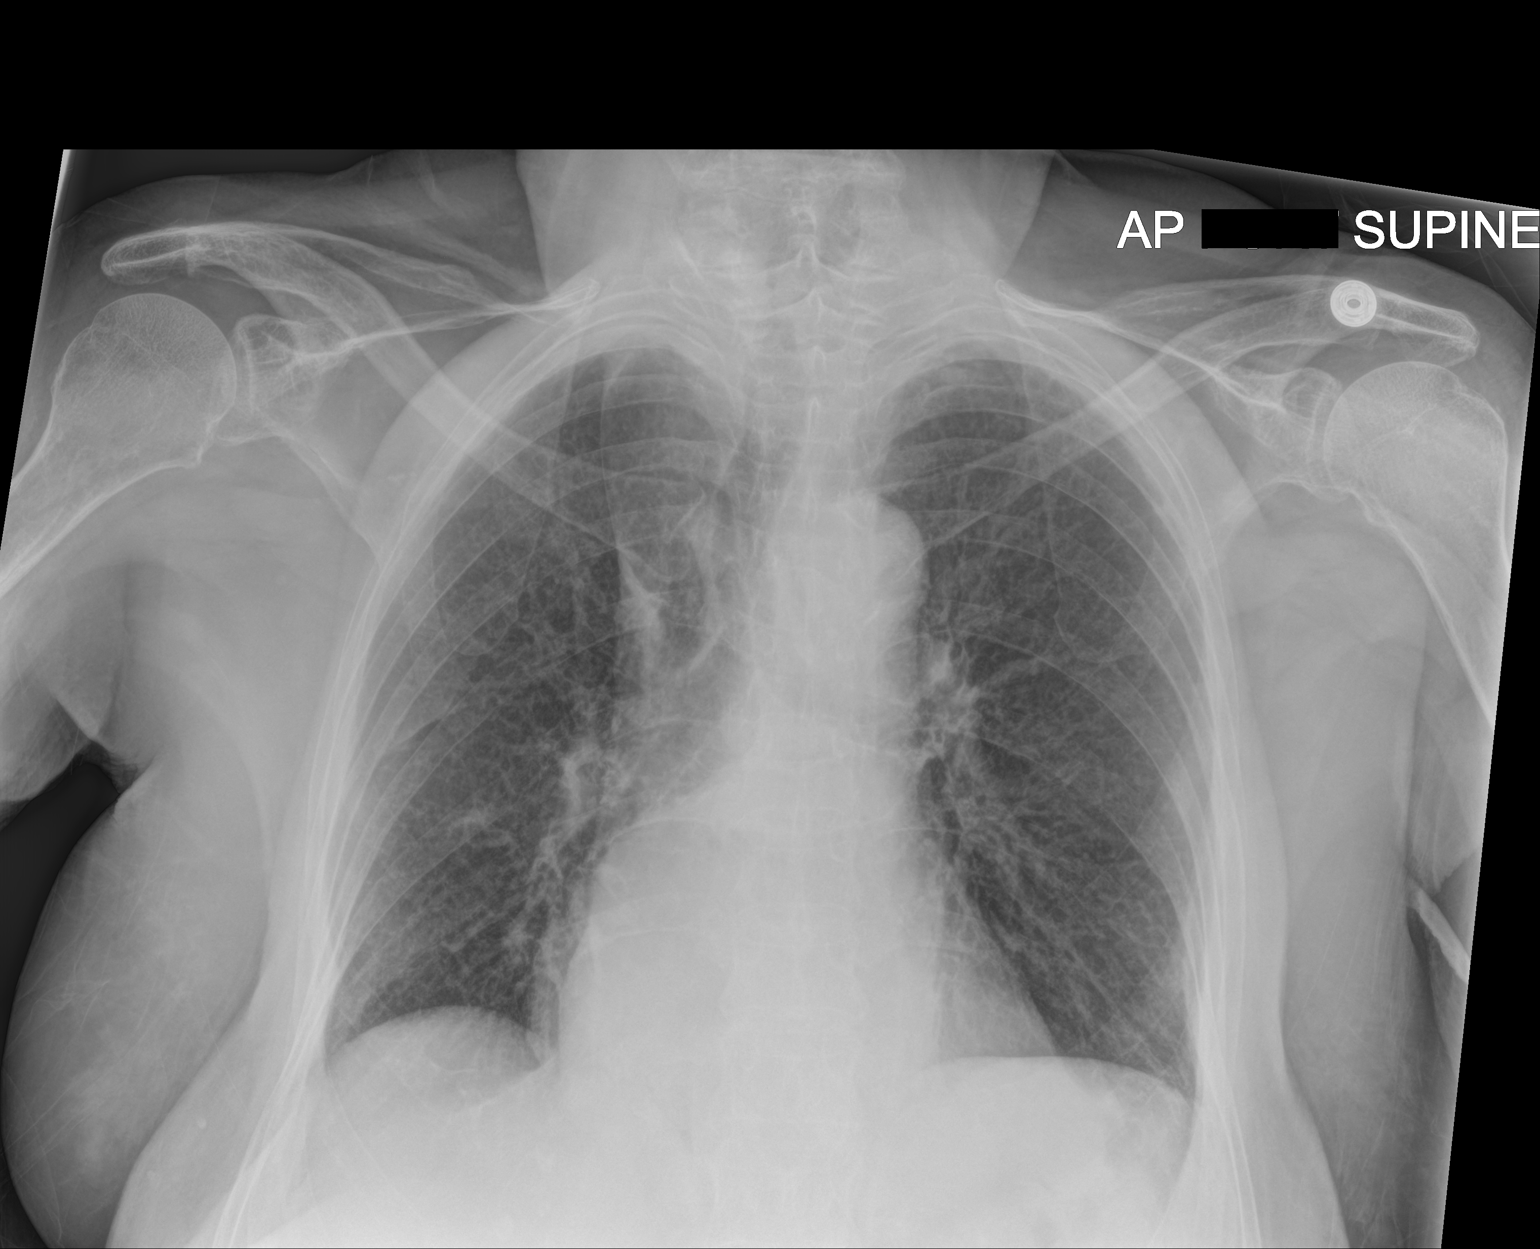

[1 of 1 positions shown; findings below may reference images not displayed]

FINDINGS: There is generalized interstitial thickening. There is no frank
edema or consolidation. Heart size and pulmonary vascularity are
normal. No adenopathy. There is atherosclerotic calcification in the
aorta. There is a hiatal hernia. Bones appear somewhat osteoporotic.
IMPRESSION: Generalized interstitial thickening without edema or consolidation.
Suspect chronic inflammatory type change in the lungs. Stable
cardiac silhouette. Hiatal hernia present. There is aortic
atherosclerosis.

## 2017-01-15 IMAGING — RF DG C-ARM 61-120 MIN
1 series · 3 of 3 positions shown · non-contrast
Comparison: Hip radiography from 2 days ago

CLINICAL DATA: Right intra medullary nail femur.

EXAM:
DG C-ARM 61-120 MIN; RIGHT FEMUR 2 VIEWS

[Series 1: run · 3 of 3 slices shown]
[im 1/3]
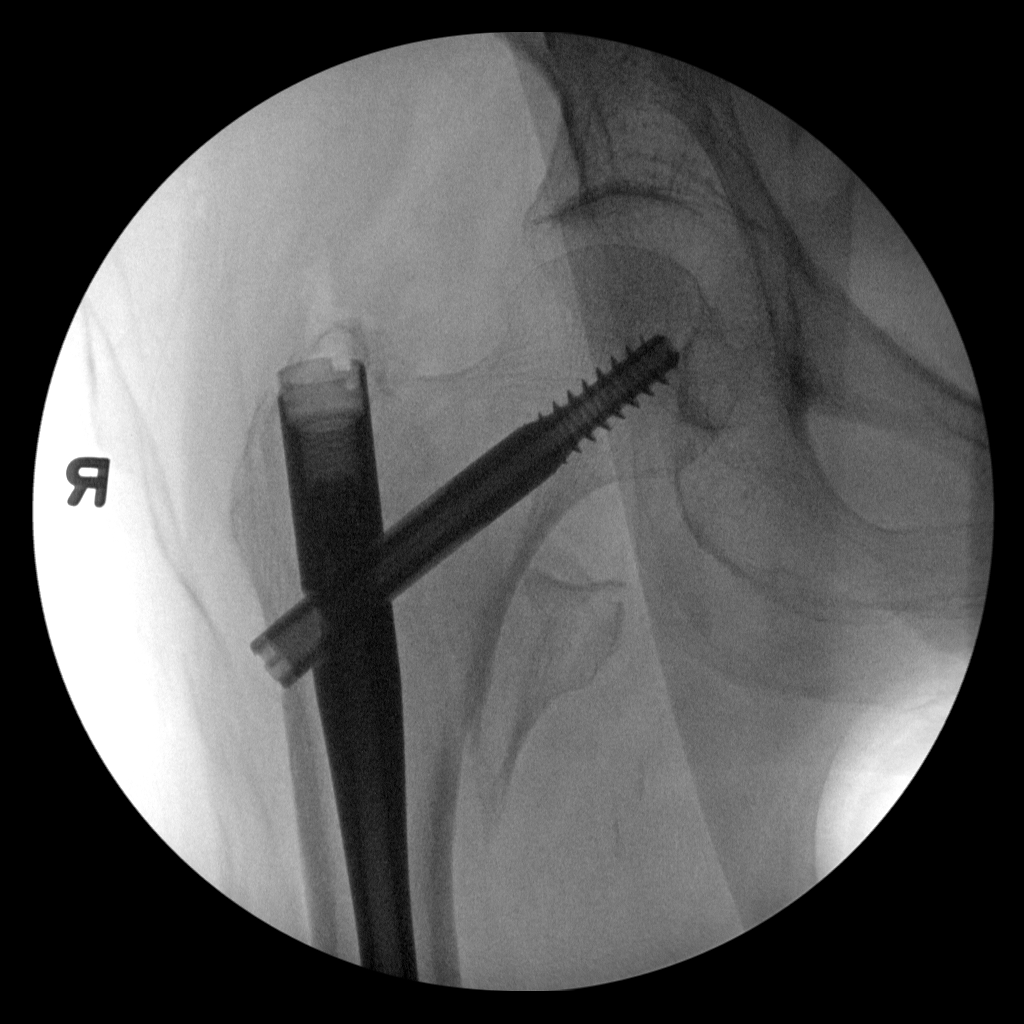
[im 2/3]
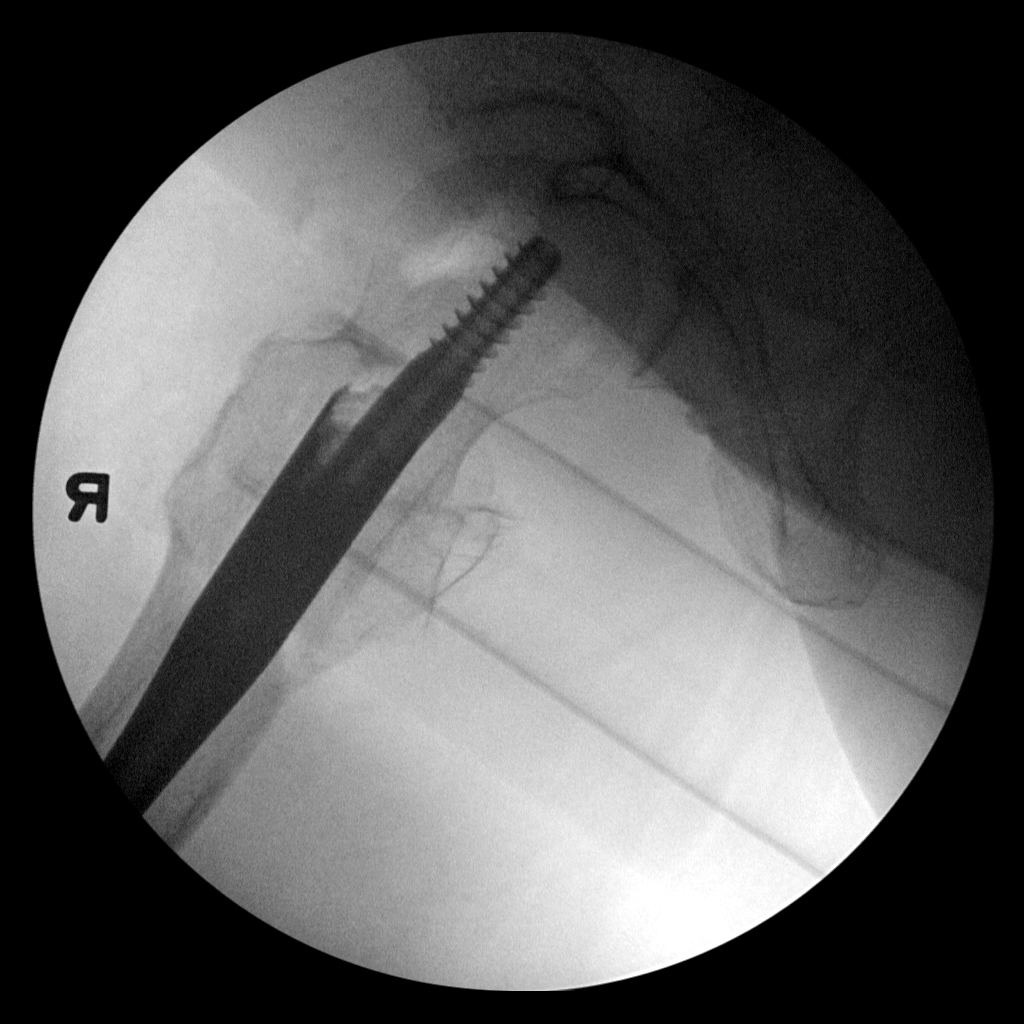
[im 3/3]
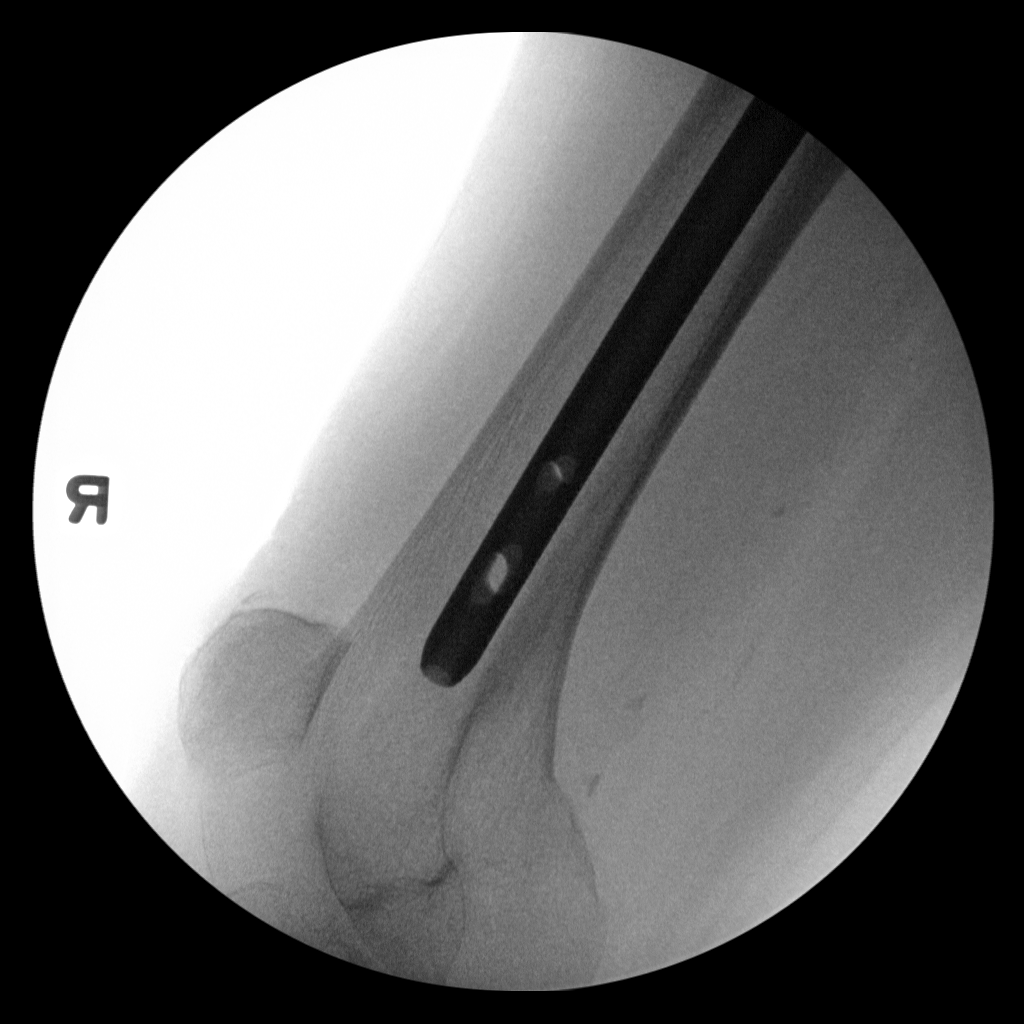

[3 of 3 positions shown; findings below may reference images not displayed]

FINDINGS: Three intraprocedural fluoroscopic images show right femur intra
medullary nail with dynamic hip screw. Stable proximal distraction
of a lesser trochanter fragment. Intertrochanteric fracture has been
reduced. No unexpected finding.
IMPRESSION: Fluoroscopy for intertrochanteric right femur fracture fixation. No
unexpected finding.

## 2017-03-26 ENCOUNTER — Inpatient Hospital Stay (HOSPITAL_COMMUNITY)
Admission: EM | Admit: 2017-03-26 | Discharge: 2017-03-29 | DRG: 391 | Disposition: A | Discharge status: Nursing Home (Includes ICF) | Payer: Medicare Other | Attending: Internal Medicine | Admitting: Internal Medicine

## 2017-03-26 ENCOUNTER — Encounter (HOSPITAL_COMMUNITY): Payer: Self-pay | Admitting: *Deleted

## 2017-03-26 DIAGNOSIS — G309 Alzheimer's disease, unspecified: Secondary | ICD-10-CM | POA: Diagnosis present

## 2017-03-26 DIAGNOSIS — R413 Other amnesia: Secondary | ICD-10-CM | POA: Diagnosis present

## 2017-03-26 DIAGNOSIS — Z79899 Other long term (current) drug therapy: Secondary | ICD-10-CM

## 2017-03-26 DIAGNOSIS — Z888 Allergy status to other drugs, medicaments and biological substances status: Secondary | ICD-10-CM

## 2017-03-26 DIAGNOSIS — Z91048 Other nonmedicinal substance allergy status: Secondary | ICD-10-CM

## 2017-03-26 DIAGNOSIS — R062 Wheezing: Secondary | ICD-10-CM | POA: Diagnosis present

## 2017-03-26 DIAGNOSIS — Z9841 Cataract extraction status, right eye: Secondary | ICD-10-CM

## 2017-03-26 DIAGNOSIS — Z7982 Long term (current) use of aspirin: Secondary | ICD-10-CM

## 2017-03-26 DIAGNOSIS — I739 Peripheral vascular disease, unspecified: Secondary | ICD-10-CM | POA: Diagnosis present

## 2017-03-26 DIAGNOSIS — D72829 Elevated white blood cell count, unspecified: Secondary | ICD-10-CM | POA: Diagnosis present

## 2017-03-26 DIAGNOSIS — K449 Diaphragmatic hernia without obstruction or gangrene: Secondary | ICD-10-CM | POA: Diagnosis present

## 2017-03-26 DIAGNOSIS — K2971 Gastritis, unspecified, with bleeding: Secondary | ICD-10-CM | POA: Diagnosis present

## 2017-03-26 DIAGNOSIS — R112 Nausea with vomiting, unspecified: Secondary | ICD-10-CM | POA: Diagnosis not present

## 2017-03-26 DIAGNOSIS — Z515 Encounter for palliative care: Secondary | ICD-10-CM | POA: Diagnosis present

## 2017-03-26 DIAGNOSIS — R0989 Other specified symptoms and signs involving the circulatory and respiratory systems: Secondary | ICD-10-CM

## 2017-03-26 DIAGNOSIS — K922 Gastrointestinal hemorrhage, unspecified: Secondary | ICD-10-CM | POA: Diagnosis not present

## 2017-03-26 DIAGNOSIS — K210 Gastro-esophageal reflux disease with esophagitis: Secondary | ICD-10-CM | POA: Diagnosis present

## 2017-03-26 DIAGNOSIS — F0390 Unspecified dementia without behavioral disturbance: Secondary | ICD-10-CM | POA: Diagnosis present

## 2017-03-26 DIAGNOSIS — Z961 Presence of intraocular lens: Secondary | ICD-10-CM | POA: Diagnosis present

## 2017-03-26 DIAGNOSIS — H353220 Exudative age-related macular degeneration, left eye, stage unspecified: Secondary | ICD-10-CM | POA: Diagnosis present

## 2017-03-26 DIAGNOSIS — Z882 Allergy status to sulfonamides status: Secondary | ICD-10-CM

## 2017-03-26 DIAGNOSIS — G894 Chronic pain syndrome: Secondary | ICD-10-CM | POA: Diagnosis present

## 2017-03-26 DIAGNOSIS — N390 Urinary tract infection, site not specified: Secondary | ICD-10-CM | POA: Diagnosis present

## 2017-03-26 DIAGNOSIS — F0280 Dementia in other diseases classified elsewhere without behavioral disturbance: Secondary | ICD-10-CM | POA: Diagnosis present

## 2017-03-26 DIAGNOSIS — K5900 Constipation, unspecified: Secondary | ICD-10-CM | POA: Diagnosis present

## 2017-03-26 DIAGNOSIS — Z66 Do not resuscitate: Secondary | ICD-10-CM | POA: Diagnosis present

## 2017-03-26 MED ORDER — ONDANSETRON HCL 4 MG/2ML IJ SOLN
4.0000 mg | Freq: Once | INTRAMUSCULAR | Status: AC | PRN
  Administered 2017-03-26: 4 mg via INTRAVENOUS
  Filled 2017-03-26: qty 2

## 2017-03-26 MED ORDER — SODIUM CHLORIDE 0.9 % IV SOLN
80.0000 mg | Freq: Once | INTRAVENOUS | Status: AC
  Administered 2017-03-27: 80 mg via INTRAVENOUS
  Filled 2017-03-26: qty 80

## 2017-03-26 MED ORDER — SODIUM CHLORIDE 0.9 % IV SOLN
8.0000 mg/h | INTRAVENOUS | Status: DC
  Administered 2017-03-27: 8 mg/h via INTRAVENOUS
  Filled 2017-03-26 (×2): qty 80

## 2017-03-26 NOTE — ED Provider Notes (Signed)
[facility]-EMERGENCY DEPT Provider Note   CSN: [id number] Arrival date & time: 03/26/17  2318     History   Chief Complaint Chief Complaint  Patient presents with  . Emesis    HPI Tami Harris is a 83 y.o. female.  The history is provided by the patient and medical records.  Emesis      Level V caveat: Dementia 83 year old female with history of Alzheimer's dementia, diverticulosis, GERD, hiatal hernia, hx of pneumonia, seasonal allergies, presenting to the ED for emesis.  Patient unable to provide additional history this time. Per facility, patient has had approximately 12 episodes of coffee-ground emesis over the past hour or so.  Unsure of fever.  Patient denies abdominal pain.  No reported bloody stools.  Takes daily aspirin, no other anticoagulation.    Past Medical History:  Diagnosis Date  . 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 Allergic rhinitis   . Alzheimer's dementia dx'd 2008  . Diverticulosis   . Fall   . Family history of adverse reaction to anesthesia    daughter reports "either faulty tube crimped or swelling from allergic reaction caused the tube to crimp"  . GERD (gastroesophageal reflux disease)   . Headache    "fragrant related"  . Hiatal hernia   . Memory loss   . Pneumonia 2013  . Radial fracture   . Seasonal allergies     Patient Active Problem List   Diagnosis Date Noted  . Palliative care encounter   . Closed right hip fracture (HCC) 06/16/2016  . Hip fracture (HCC) 06/16/2016  . Goals of care, counseling/discussion   . Advance care planning   . Palliative care by specialist   . Fall 04/18/2016  . Distal radius fracture 04/18/2016  . Syncope 04/18/2016  . PVD (peripheral vascular disease) (HCC)   . Critical lower limb ischemia 04/08/2016  . 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 Pneumonia 10/18/2011  . Dementia 10/18/2011    Past Surgical History:  Procedure Laterality Date  .  BLADDER SUSPENSION  X 2  . CATARACT EXTRACTION W/ INTRAOCULAR LENS IMPLANT Right   . DILATION AND CURETTAGE OF UTERUS    . FEMUR IM NAIL Right 06/17/2016   Procedure: INTRAMEDULLARY (IM) NAIL FEMORAL;  Surgeon: Sheral Apley, MD;  Location: [facility] OR;  Service: Orthopedics;  Laterality: Right;  . INGUINAL HERNIA REPAIR Right   . PERIPHERAL VASCULAR CATHETERIZATION N/A 05/16/2016   Procedure: Lower Extremity Angiography;  Surgeon: Runell Gess, MD;  Location: [facility] INVASIVE CV LAB;  Service: Cardiovascular;  Laterality: N/A;    OB History    No data available       Home Medications    Prior to Admission medications   Medication Sig Start Date End Date Taking? Authorizing Provider  acetaminophen (TYLENOL) 325 MG tablet Take 325 mg by mouth every 6 (six) hours as needed for mild pain or fever.     [provider]  aspirin EC 325 MG EC tablet Take 1 tablet (325 mg total) by mouth daily with breakfast. 06/22/16   Richarda Overlie, MD  bisacodyl (DULCOLAX) 10 MG suppository Place 1 suppository (10 mg total) rectally daily as needed for moderate constipation. 06/21/16   Richarda Overlie, MD  calcium carbonate (TUMS - DOSED IN MG ELEMENTAL CALCIUM) 500 MG chewable tablet Chew 1 tablet by mouth daily.    [provider]  docusate sodium (COLACE) 100 MG capsule Take 1 capsule (100 mg  total) by mouth 2 (two) times daily. 06/21/16   Richarda OverlieAbrol, Nayana, MD  donepezil (ARICEPT) 10 MG tablet Take 1 tablet (10 mg total) by mouth at bedtime. 10/19/11 06/15/16  Vassie LollMadera, Carlos, MD  ferrous sulfate (FERROUSUL) 325 (65 FE) MG tablet Take 1 tablet (325 mg total) by mouth 2 (two) times daily with a meal. 06/21/16   Richarda OverlieAbrol, Nayana, MD  HYDROcodone-acetaminophen (NORCO/VICODIN) 5-325 MG tablet Take 1 tablet by mouth every 6 (six) hours as needed for moderate pain. 06/21/16   Richarda OverlieAbrol, Nayana, MD  memantine (NAMENDA) 10 MG tablet Take 1 tablet (10 mg total) by mouth 2 (two) times daily. 03/11/16   Nita SicklePatel, Donika K, DO    Multiple Vitamin (MULTIVITAMIN WITH MINERALS) TABS tablet Take 1 tablet by mouth daily.    [provider]  Omega-3 Fatty Acids (FISH OIL) 1000 MG CAPS Take 1 capsule by mouth 2 (two) times daily.     [provider]  traMADol (ULTRAM) 50 MG tablet Take 50 mg by mouth every 6 (six) hours as needed for moderate pain.  04/05/16   [provider]    Family History Family History  Problem Relation Age of Onset  . Dementia Mother   . Heart failure Mother   . Heart failure Father   . Parkinsonism Brother     Social History Social History  Substance Use Topics  . Smoking status: Never Smoker  . Smokeless tobacco: Never Used  . Alcohol use No     Allergies   Other; Sulfa antibiotics; and Tape   Review of Systems Review of Systems  Unable to perform ROS: Dementia     Physical Exam Updated Vital Signs BP (!) 152/83 (BP Location: Left Arm)   Pulse 66   Temp 98.3 F (36.8 C) (Oral)   Resp 18   SpO2 95%   Physical Exam  Constitutional: She is oriented to person, place, and time. She appears well-developed and well-nourished.  Elderly, emesis bag full of coffee ground emesis  HENT:  Head: Normocephalic and atraumatic.  Mouth/Throat: Oropharynx is clear and moist.  Tongue appears dark in color from bloody emesis  Eyes: Conjunctivae and EOM are normal. Pupils are equal, round, and reactive to light.  Neck: Normal range of motion.  Cardiovascular: Normal rate, regular rhythm and normal heart sounds.   Pulmonary/Chest: Effort normal and breath sounds normal.  Abdominal: Soft. She exhibits distension. Bowel sounds are decreased. There is tenderness.  Abdomen soft, appears mildly distended, some tenderness in the epigastrium; bowel sounds decreased  Musculoskeletal: Normal range of motion.  Neurological: She is alert and oriented to person, place, and time.  Skin: Skin is warm and dry.  Psychiatric: She has a normal mood and affect.  Nursing note and  vitals reviewed.    ED Treatments / Results  Labs (all labs ordered are listed, but only abnormal results are displayed) Labs Reviewed  COMPREHENSIVE METABOLIC PANEL - Abnormal; Notable for the following:       Result Value   Chloride 100 (*)    Glucose, Bld 161 (*)    All other components within normal limits  CBC - Abnormal; Notable for the following:    WBC 14.7 (*)    Hemoglobin 15.6 (*)    All other components within normal limits  OCCULT BLOOD GASTRIC / DUODENUM (SPECIMEN CUP) - Abnormal; Notable for the following:    Occult Blood, Gastric POSITIVE (*)    All other components within normal limits  LIPASE, BLOOD  PROTIME-INR  APTT  URINALYSIS, ROUTINE W REFLEX MICROSCOPIC  TYPE AND SCREEN    EKG  EKG Interpretation None       Radiology Ct Abdomen Pelvis W Contrast  Result Date: 03/27/2017 CLINICAL DATA:  Initial evaluation for recurrent bloody emesis. EXAM: CT ABDOMEN AND PELVIS WITH CONTRAST TECHNIQUE: Multidetector CT imaging of the abdomen and pelvis was performed using the standard protocol following bolus administration of intravenous contrast. CONTRAST:  100 cc of Isovue-300. COMPARISON:  None available. FINDINGS: Lower chest: Scattered atelectatic changes present within the visualized lung bases. Visualized lungs are otherwise clear. Hepatobiliary: Liver demonstrates a normal contrast enhanced appearance. Cholelithiasis noted. Gallbladder otherwise within normal limits without acute inflammatory changes. No biliary dilatation. Pancreas: Pancreas within normal limits. Spleen: Spleen within normal limits. Adrenals/Urinary Tract: The adrenal glands are normal. Kidneys equal in size with symmetric enhancement. No nephrolithiasis, hydronephrosis, or focal enhancing renal mass. No hydroureter. Bladder without acute abnormality. Small bladder diverticulum noted on the left. Stomach/Bowel: Large hiatal hernia with a good portion the stomach in the thorax. No evidence for  obstruction. Small bowel of normal caliber. Appendix normal. Colonic diverticulosis without evidence for acute diverticulitis. No acute inflammatory changes seen about the bowels. Vascular/Lymphatic: Moderate aorto bi-iliac atherosclerotic disease. No aneurysm. Normal intravascular enhancement seen throughout the intra-abdominal aorta and its branch vessels. No adenopathy. Reproductive: Uterus is absent. Small bilateral ovarian cysts measure up to 2 cm on the right. These are most certainly benign given size. Ovaries otherwise unremarkable. Other: No free air or fluid. Musculoskeletal: No acute osseus abnormality. No worrisome lytic or blastic osseous lesions. Fixation hardware present at the right femur. IMPRESSION: 1. Large hiatal hernia. 2. No other acute intra-abdominal or pelvic process. 3. Colonic diverticulosis without evidence for acute diverticulitis. 4. Cholelithiasis. 5. Moderate aorto bi-iliac atherosclerotic disease. Electronically Signed   By: Rise Mu M.D.   On: 03/27/2017 05:13    Procedures Procedures (including critical care time)  CRITICAL CARE Performed by: Garlon Hatchet   Total critical care time: 35 minutes  Critical care time was exclusive of separately billable procedures and treating other patients.  Critical care was necessary to treat or prevent imminent or life-threatening deterioration.  Critical care was time spent personally by me on the following activities: development of treatment plan with patient and/or surrogate as well as nursing, discussions with consultants, evaluation of patient's response to treatment, examination of patient, obtaining history from patient or surrogate, ordering and performing treatments and interventions, ordering and review of laboratory studies, ordering and review of radiographic studies, pulse oximetry and re-evaluation of patient's condition.   Medications Ordered in ED Medications  pantoprazole (PROTONIX) 80 mg in  sodium chloride 0.9 % 250 mL (0.32 mg/mL) infusion (8 mg/hr Intravenous New Bag/Given 03/27/17 0005)  ondansetron (ZOFRAN) injection 4 mg (4 mg Intravenous Given 03/26/17 2336)  pantoprazole (PROTONIX) 80 mg in sodium chloride 0.9 % 100 mL IVPB (0 mg Intravenous Stopped 03/27/17 0059)  iopamidol (ISOVUE-300) 61 % injection 100 mL (100 mLs Intravenous Contrast Given 03/27/17 0431)     Initial Impression / Assessment and Plan / ED Course  I have reviewed the triage vital signs and the nursing notes.  Pertinent labs & imaging results that were available during my care of the patient were reviewed by me and considered in my medical decision making (see chart for details).  83 year old female here with likely upper GI bleed. She has bad full of coffee-ground emesis on arrival. She is demented at baseline. Vitals are stable. Does  have what appears to be blood in the mouth. She is on aspirin, no other anticoagulation. Some mild tenderness in epigastrium on exam. Possibly some distention. We'll plan for labs, CT scan.  Patient given protonix bolus and started on drip.  Labs overall reassuring. Gastric occult was positive.  CT scan without acute findings.  No further emesis here after zofran.  VS remain stable.  Discussed with hospitalist-- will admit for ongoing care.  Will need to see GI at some point.  Final Clinical Impressions(s) / ED Diagnoses   Final diagnoses:  Upper GI bleed    New Prescriptions New Prescriptions   No medications on file     Garlon Hatchet, PA-C 03/27/17 0617    Palumbo, April, MD 03/27/17 [phone]

## 2017-03-27 ENCOUNTER — Encounter (HOSPITAL_COMMUNITY): Payer: Self-pay

## 2017-03-27 ENCOUNTER — Observation Stay (HOSPITAL_COMMUNITY): Payer: Medicare Other

## 2017-03-27 ENCOUNTER — Emergency Department (HOSPITAL_COMMUNITY): Payer: Medicare Other

## 2017-03-27 DIAGNOSIS — R112 Nausea with vomiting, unspecified: Secondary | ICD-10-CM | POA: Diagnosis not present

## 2017-03-27 DIAGNOSIS — K5900 Constipation, unspecified: Secondary | ICD-10-CM | POA: Diagnosis present

## 2017-03-27 DIAGNOSIS — K210 Gastro-esophageal reflux disease with esophagitis: Secondary | ICD-10-CM

## 2017-03-27 DIAGNOSIS — K922 Gastrointestinal hemorrhage, unspecified: Secondary | ICD-10-CM | POA: Diagnosis present

## 2017-03-27 DIAGNOSIS — K449 Diaphragmatic hernia without obstruction or gangrene: Secondary | ICD-10-CM

## 2017-03-27 DIAGNOSIS — D72829 Elevated white blood cell count, unspecified: Secondary | ICD-10-CM | POA: Diagnosis present

## 2017-03-27 LAB — CBC
HCT: 44.1 % (ref 36.0–46.0)
HEMATOCRIT: 42.3 % (ref 36.0–46.0)
HEMATOCRIT: 38.7 % (ref 36.0–46.0)
HEMATOCRIT: 45.1 % (ref 36.0–46.0)
HEMOGLOBIN: 13.1 g/dL (ref 12.0–15.0)
Hemoglobin: 14.7 g/dL (ref 12.0–15.0)
Hemoglobin: 15.6 g/dL — ABNORMAL HIGH (ref 12.0–15.0)
Hemoglobin: 16 g/dL — ABNORMAL HIGH (ref 12.0–15.0)
MCH: 31.8 pg (ref 26.0–34.0)
MCH: 32.2 pg (ref 26.0–34.0)
MCH: 33 pg (ref 26.0–34.0)
MCH: 34.7 pg — AB (ref 26.0–34.0)
MCHC: 33.9 g/dL (ref 30.0–36.0)
MCHC: 34.6 g/dL (ref 30.0–36.0)
MCHC: 34.8 g/dL (ref 30.0–36.0)
MCHC: 36.3 g/dL — ABNORMAL HIGH (ref 30.0–36.0)
MCV: 92 fL (ref 78.0–100.0)
MCV: 95.1 fL (ref 78.0–100.0)
MCV: 95.1 fL (ref 78.0–100.0)
MCV: 95.7 fL (ref 78.0–100.0)
PLATELETS: 192 10*3/uL (ref 150–400)
PLATELETS: 184 10*3/uL (ref 150–400)
Platelets: 204 10*3/uL (ref 150–400)
Platelets: 229 10*3/uL (ref 150–400)
RBC: 4.07 MIL/uL (ref 3.87–5.11)
RBC: 4.45 MIL/uL (ref 3.87–5.11)
RBC: 4.61 MIL/uL (ref 3.87–5.11)
RBC: 4.9 MIL/uL (ref 3.87–5.11)
RDW: 13 % (ref 11.5–15.5)
RDW: 13.1 % (ref 11.5–15.5)
RDW: 12.9 % (ref 11.5–15.5)
RDW: 12.5 % (ref 11.5–15.5)
WBC: 11.1 10*3/uL — AB (ref 4.0–10.5)
WBC: 8.7 10*3/uL (ref 4.0–10.5)
WBC: 9.4 10*3/uL (ref 4.0–10.5)
WBC: 14.7 10*3/uL — AB (ref 4.0–10.5)

## 2017-03-27 LAB — RETICULOCYTES
RBC.: 4.61 MIL/uL (ref 3.87–5.11)
RETIC COUNT ABSOLUTE: 78.4 10*3/uL (ref 19.0–186.0)
RETIC CT PCT: 1.7 % (ref 0.4–3.1)

## 2017-03-27 LAB — LACTIC ACID, PLASMA
Lactic Acid, Venous: 1.4 mmol/L (ref 0.5–1.9)
Lactic Acid, Venous: 1.4 mmol/L (ref 0.5–1.9)

## 2017-03-27 LAB — URINALYSIS, ROUTINE W REFLEX MICROSCOPIC
BILIRUBIN URINE: NEGATIVE
GLUCOSE, UA: NEGATIVE mg/dL
HGB URINE DIPSTICK: NEGATIVE
Ketones, ur: NEGATIVE mg/dL
NITRITE: NEGATIVE
Protein, ur: NEGATIVE mg/dL
SPECIFIC GRAVITY, URINE: 1.042 — AB (ref 1.005–1.030)
pH: 5 (ref 5.0–8.0)

## 2017-03-27 LAB — APTT: APTT: 31 s (ref 24–36)

## 2017-03-27 LAB — LIPASE, BLOOD: Lipase: 25 U/L (ref 11–51)

## 2017-03-27 LAB — COMPREHENSIVE METABOLIC PANEL
ALK PHOS: 60 U/L (ref 38–126)
ALT: 18 U/L (ref 14–54)
AST: 22 U/L (ref 15–41)
Albumin: 4 g/dL (ref 3.5–5.0)
Anion gap: 10 (ref 5–15)
BILIRUBIN TOTAL: 0.6 mg/dL (ref 0.3–1.2)
BUN: 15 mg/dL (ref 6–20)
CALCIUM: 9.5 mg/dL (ref 8.9–10.3)
CO2: 32 mmol/L (ref 22–32)
Chloride: 100 mmol/L — ABNORMAL LOW (ref 101–111)
Creatinine, Ser: 0.72 mg/dL (ref 0.44–1.00)
GFR calc Af Amer: >60 mL/min (ref >60)
Glucose, Bld: 161 mg/dL — ABNORMAL HIGH (ref 65–99)
Potassium: 3.6 mmol/L (ref 3.5–5.1)
Sodium: 142 mmol/L (ref 135–145)
TOTAL PROTEIN: 7 g/dL (ref 6.5–8.1)

## 2017-03-27 LAB — TYPE AND SCREEN
ABO/RH(D): A POS
Antibody Screen: NEGATIVE

## 2017-03-27 LAB — MRSA PCR SCREENING: MRSA by PCR: NEGATIVE

## 2017-03-27 LAB — OCCULT BLOOD GASTRIC / DUODENUM (SPECIMEN CUP)
OCCULT BLOOD, GASTRIC: POSITIVE — AB
PH, GASTRIC: 3

## 2017-03-27 LAB — PROTIME-INR
INR: 1
PROTHROMBIN TIME: 13.2 s (ref 11.4–15.2)

## 2017-03-27 MED ORDER — ACETAMINOPHEN 325 MG PO TABS: 325.0000 mg | ORAL_TABLET | Freq: Four times a day (QID) | ORAL | Status: DC | PRN

## 2017-03-27 MED ORDER — SUCRALFATE 1 GM/10ML PO SUSP
1.0000 g | Freq: Three times a day (TID) | ORAL | Status: DC
Start: 2017-03-27 — End: 2017-03-29
  Administered 2017-03-27 – 2017-03-29 (×9): 1 g via ORAL
  Filled 2017-03-27 (×9): qty 10

## 2017-03-27 MED ORDER — SODIUM CHLORIDE 0.9% FLUSH
3.0000 mL | Freq: Two times a day (BID) | INTRAVENOUS | Status: DC
  Administered 2017-03-27: 3 mL via INTRAVENOUS

## 2017-03-27 MED ORDER — TRAMADOL HCL 50 MG PO TABS: 50.0000 mg | ORAL_TABLET | Freq: Four times a day (QID) | ORAL | Status: DC | PRN

## 2017-03-27 MED ORDER — ONDANSETRON HCL 4 MG/2ML IJ SOLN: 4.0000 mg | Freq: Four times a day (QID) | INTRAMUSCULAR | Status: DC | PRN

## 2017-03-27 MED ORDER — SODIUM CHLORIDE 0.9 % IV SOLN
INTRAVENOUS | Status: DC
  Administered 2017-03-27 – 2017-03-29 (×3): via INTRAVENOUS

## 2017-03-27 MED ORDER — FERROUS SULFATE 325 (65 FE) MG PO TABS
325.0000 mg | ORAL_TABLET | Freq: Two times a day (BID) | ORAL | Status: DC
  Administered 2017-03-27 – 2017-03-29 (×5): 325 mg via ORAL
  Filled 2017-03-27 (×5): qty 1

## 2017-03-27 MED ORDER — ALUM & MAG HYDROXIDE-SIMETH 200-200-20 MG/5ML PO SUSP: 15.0000 mL | ORAL | Status: DC | PRN

## 2017-03-27 MED ORDER — ACETAMINOPHEN 650 MG RE SUPP: 650.0000 mg | Freq: Four times a day (QID) | RECTAL | Status: DC | PRN

## 2017-03-27 MED ORDER — ACETAMINOPHEN 325 MG PO TABS: 650.0000 mg | ORAL_TABLET | Freq: Four times a day (QID) | ORAL | Status: DC | PRN

## 2017-03-27 MED ORDER — IOPAMIDOL (ISOVUE-300) INJECTION 61%
INTRAVENOUS | Status: AC
  Administered 2017-03-27: 100 mL via INTRAVENOUS
  Filled 2017-03-27: qty 100

## 2017-03-27 MED ORDER — IPRATROPIUM-ALBUTEROL 0.5-2.5 (3) MG/3ML IN SOLN: 3.0000 mL | Freq: Four times a day (QID) | RESPIRATORY_TRACT | Status: DC | PRN

## 2017-03-27 MED ORDER — IOPAMIDOL (ISOVUE-300) INJECTION 61%
100.0000 mL | Freq: Once | INTRAVENOUS | Status: AC | PRN
  Administered 2017-03-27: 100 mL via INTRAVENOUS

## 2017-03-27 MED ORDER — ONDANSETRON HCL 4 MG/2ML IJ SOLN
4.0000 mg | Freq: Once | INTRAMUSCULAR | Status: AC
  Administered 2017-03-27: 4 mg via INTRAVENOUS
  Filled 2017-03-27: qty 2

## 2017-03-27 MED ORDER — PANTOPRAZOLE SODIUM 40 MG IV SOLR
40.0000 mg | Freq: Two times a day (BID) | INTRAVENOUS | Status: DC
  Administered 2017-03-27 – 2017-03-29 (×5): 40 mg via INTRAVENOUS
  Filled 2017-03-27 (×5): qty 40

## 2017-03-27 MED ORDER — ONDANSETRON HCL 4 MG PO TABS: 4.0000 mg | ORAL_TABLET | Freq: Four times a day (QID) | ORAL | Status: DC | PRN

## 2017-03-27 MED ORDER — POLYETHYLENE GLYCOL 3350 17 G PO PACK
17.0000 g | PACK | Freq: Every day | ORAL | Status: DC
  Administered 2017-03-27 – 2017-03-29 (×3): 17 g via ORAL
  Filled 2017-03-27 (×3): qty 1

## 2017-03-27 NOTE — ED Provider Notes (Signed)
[facility]-EMERGENCY DEPT Provider Note Medical screening examination/treatment/procedure(s) were conducted as a shared visit with non-physician practitioner(s) and myself.  I personally evaluated the patient during the encounter.  CSN: [id number] Arrival date & time: 03/26/17  2318  By signing my name below, I, Ny'Kea Lewis, attest that this documentation has been prepared under the direction and in the presence of Ebony Rickel, MD. Electronically Signed: Karren Cobble, ED Scribe. 03/27/17. 12:32 AM.  History   Chief Complaint Chief Complaint  Patient presents with  . Emesis   LEVEL V CAVEAT: HPI and ROS limited due to dementia.    The history is provided by a relative. History limited by: Dementia. No language interpreter was used.   HPI HPI Comments: Tami Harris is a 83 y.o. female with a history of diverticulosis and GERD, brought in by ambulance, who presents to the Emergency Department complaining of sudden onset of continuous vomiting that began one hour prior to arrival. Per pt's daughter she was contacted by Chip Boer that her mother has been vomiting for over an hour. The emesis was brown in color. Her last episode occurred as she arrived to the ED.   Past Medical History:  Diagnosis Date  . 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 Allergic rhinitis   . Alzheimer's dementia dx'd 2008  . Diverticulosis   . Fall   . Family history of adverse reaction to anesthesia    daughter reports "either faulty tube crimped or swelling from allergic reaction caused the tube to crimp"  . GERD (gastroesophageal reflux disease)   . Headache    "fragrant related"  . Hiatal hernia   . Memory loss   . Pneumonia 2013  . Radial fracture   . Seasonal allergies     Patient Active Problem List   Diagnosis Date Noted  . Palliative care encounter   . Closed right hip fracture (HCC) 06/16/2016  . Hip fracture (HCC) 06/16/2016  . Goals of  care, counseling/discussion   . Advance care planning   . Palliative care by specialist   . Fall 04/18/2016  . Distal radius fracture 04/18/2016  . Syncope 04/18/2016  . PVD (peripheral vascular disease) (HCC)   . Critical lower limb ischemia 04/08/2016  . 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 Pneumonia 10/18/2011  . Dementia 10/18/2011    Past Surgical History:  Procedure Laterality Date  . BLADDER SUSPENSION  X 2  . CATARACT EXTRACTION W/ INTRAOCULAR LENS IMPLANT Right   . DILATION AND CURETTAGE OF UTERUS    . FEMUR IM NAIL Right 06/17/2016   Procedure: INTRAMEDULLARY (IM) NAIL FEMORAL;  Surgeon: Sheral Apley, MD;  Location: [facility] OR;  Service: Orthopedics;  Laterality: Right;  . INGUINAL HERNIA REPAIR Right   . PERIPHERAL VASCULAR CATHETERIZATION N/A 05/16/2016   Procedure: Lower Extremity Angiography;  Surgeon: Runell Gess, MD;  Location: [facility] INVASIVE CV LAB;  Service: Cardiovascular;  Laterality: N/A;    OB History    No data available       Home Medications    Prior to Admission medications   Medication Sig Start Date End Date Taking? Authorizing Provider  acetaminophen (TYLENOL) 325 MG tablet Take 325 mg by mouth every 6 (six) hours as needed for mild pain or fever.     [provider]  aspirin EC 325 MG EC tablet Take 1 tablet (325 mg total) by mouth daily with breakfast. 06/22/16   Richarda Overlie, MD  bisacodyl (DULCOLAX)  10 MG suppository Place 1 suppository (10 mg total) rectally daily as needed for moderate constipation. 06/21/16   Richarda OverlieAbrol, Nayana, MD  calcium carbonate (TUMS - DOSED IN MG ELEMENTAL CALCIUM) 500 MG chewable tablet Chew 1 tablet by mouth daily.    [provider]  docusate sodium (COLACE) 100 MG capsule Take 1 capsule (100 mg total) by mouth 2 (two) times daily. 06/21/16   Richarda OverlieAbrol, Nayana, MD  donepezil (ARICEPT) 10 MG tablet Take 1 tablet (10 mg total) by mouth at bedtime. 10/19/11 06/15/16  Vassie LollMadera, Carlos, MD  ferrous sulfate  (FERROUSUL) 325 (65 FE) MG tablet Take 1 tablet (325 mg total) by mouth 2 (two) times daily with a meal. 06/21/16   Richarda OverlieAbrol, Nayana, MD  HYDROcodone-acetaminophen (NORCO/VICODIN) 5-325 MG tablet Take 1 tablet by mouth every 6 (six) hours as needed for moderate pain. 06/21/16   Richarda OverlieAbrol, Nayana, MD  memantine (NAMENDA) 10 MG tablet Take 1 tablet (10 mg total) by mouth 2 (two) times daily. 03/11/16   Nita SicklePatel, Donika K, DO  Multiple Vitamin (MULTIVITAMIN WITH MINERALS) TABS tablet Take 1 tablet by mouth daily.    [provider]  Omega-3 Fatty Acids (FISH OIL) 1000 MG CAPS Take 1 capsule by mouth 2 (two) times daily.     [provider]  traMADol (ULTRAM) 50 MG tablet Take 50 mg by mouth every 6 (six) hours as needed for moderate pain.  04/05/16   [provider]    Family History Family History  Problem Relation Age of Onset  . Dementia Mother   . Heart failure Mother   . Heart failure Father   . Parkinsonism Brother     Social History Social History  Substance Use Topics  . Smoking status: Never Smoker  . Smokeless tobacco: Never Used  . Alcohol use No     Allergies   Other; Sulfa antibiotics; and Tape   Review of Systems Review of Systems  Unable to perform ROS: Dementia  All other systems reviewed and are negative.   Physical Exam Updated Vital Signs BP (!) 152/83 (BP Location: Left Arm)   Pulse 66   Temp 98.3 F (36.8 C) (Oral)   Resp 18   SpO2 95%   Physical Exam  Constitutional: She appears well-developed and well-nourished.  HENT:  Head: Normocephalic.  Mouth/Throat: Oropharynx is clear and moist and mucous membranes are normal. No oropharyngeal exudate.  Eyes: Conjunctivae and EOM are normal. Pupils are equal, round, and reactive to light. Right eye exhibits no discharge. Left eye exhibits no discharge. No scleral icterus.  Neck: Normal range of motion. Neck supple. No JVD present. No tracheal deviation present.  Trachea is midline. No stridor  or carotid bruits.   Cardiovascular: Normal rate, regular rhythm, normal heart sounds and intact distal pulses.   No murmur heard. Pulmonary/Chest: Effort normal and breath sounds normal. No stridor. No respiratory distress. She has no wheezes. She has no rales.  Lungs CTA bilaterally.  Abdominal: Soft. Bowel sounds are normal. She exhibits no distension. There is no tenderness. There is no rebound and no guarding.  Musculoskeletal: Normal range of motion. She exhibits no edema or tenderness.  All compartments are soft. No palpable cords.   Lymphadenopathy:    She has no cervical adenopathy.  Neurological: She is alert. She has normal reflexes. She displays normal reflexes. She exhibits normal muscle tone.  Skin: Skin is warm and dry. Capillary refill takes less than 2 seconds.  Psychiatric: She has a normal mood and  affect. Her behavior is normal.  Nursing note and vitals reviewed.    ED Treatments / Results  DIAGNOSTIC STUDIES: Oxygen Saturation is 95% on RA, adequate by my interpretation.   COORDINATION OF CARE: 12:02 AM-Discussed next steps with pt. Pt verbalized understanding and is agreeable with the plan.   Labs (all labs ordered are listed, but only abnormal results are displayed)  Results for orders placed or performed during the hospital encounter of 03/26/17  Lipase, blood  Result Value Ref Range   Lipase 25 11 - 51 U/L  Comprehensive metabolic panel  Result Value Ref Range   Sodium 142 135 - 145 mmol/L   Potassium 3.6 3.5 - 5.1 mmol/L   Chloride 100 (L) 101 - 111 mmol/L   CO2 32 22 - 32 mmol/L   Glucose, Bld 161 (H) 65 - 99 mg/dL   BUN 15 6 - 20 mg/dL   Creatinine, Ser 1.61 0.44 - 1.00 mg/dL   Calcium 9.5 8.9 - 09.6 mg/dL   Total Protein 7.0 6.5 - 8.1 g/dL   Albumin 4.0 3.5 - 5.0 g/dL   AST 22 15 - 41 U/L   ALT 18 14 - 54 U/L   Alkaline Phosphatase 60 38 - 126 U/L   Total Bilirubin 0.6 0.3 - 1.2 mg/dL   GFR calc non Af Amer >60 >60 mL/min   GFR calc Af  Amer >60 >60 mL/min   Anion gap 10 5 - 15  CBC  Result Value Ref Range   WBC 14.7 (H) 4.0 - 10.5 K/uL   RBC 4.90 3.87 - 5.11 MIL/uL   Hemoglobin 15.6 (H) 12.0 - 15.0 g/dL   HCT 04.5 40.9 - 81.1 %   MCV 92.0 78.0 - 100.0 fL   MCH 31.8 26.0 - 34.0 pg   MCHC 34.6 30.0 - 36.0 g/dL   RDW 91.4 78.2 - 95.6 %   Platelets 229 150 - 400 K/uL  Protime-INR  Result Value Ref Range   Prothrombin Time 13.2 11.4 - 15.2 seconds   INR 1.00   APTT  Result Value Ref Range   aPTT 31 24 - 36 seconds  Occult bld gastric/duodenum (cup to lab)  Result Value Ref Range   pH, Gastric 3    Occult Blood, Gastric POSITIVE (A) NEGATIVE   No results found.  Procedures Procedures (including critical care time)  Medications Ordered in ED Medications  pantoprazole (PROTONIX) 80 mg in sodium chloride 0.9 % 100 mL IVPB (80 mg Intravenous New Bag/Given 03/27/17 0005)  pantoprazole (PROTONIX) 80 mg in sodium chloride 0.9 % 250 mL (0.32 mg/mL) infusion (8 mg/hr Intravenous New Bag/Given 03/27/17 0005)  ondansetron (ZOFRAN) injection 4 mg (4 mg Intravenous Given 03/26/17 2336)    I personally performed the services described in this documentation, which was scribed in my presence. The recorded information has been reviewed and is accurate.      Sabriel Borromeo, MD 03/27/17 0100

## 2017-03-27 NOTE — Progress Notes (Signed)
Acknowledged CSW consult- [facility]. Left voicemail for pt's daughter.  Spoke with facility Elease Hashimoto(Alisha) who confirmed pt is resident and states they are aware of her hospitalization. Caretakers in meeting and unable to provide information currently about pt's needs and baseline functioning.  Full assessment to follow.   Ilean SkillMeghan Rene Gonsoulin, MSW, LCSW Clinical Social Work 03/27/2017 [phone]

## 2017-03-27 NOTE — H&P (Addendum)
[facility] History and Physical   Patient: Tami Harris ZOX:[id number]   PCP: Angela Cox, MD DOB: 09-05-1924   DOA: 03/26/2017   DOS: 03/27/2017   DOS: the patient was seen and examined on 03/26/2017  Patient coming from: The patient is coming from SNF.  Chief Complaint: intractable nausea and vomiting   HPI: Tami Harris is a 83 y.o. female with Past medical history of large hiatal hernia, dementia, GERD, PVD. Pt presented with multiple episode of vomiting starting last night. It has been reported that she had 12 episode of vomiting, many of which were reported coffee-ground. She at the time of my eval denies any nausea, abdominal pain.  Pt denies any fever, chills, headache, runny nose, neck pain, choking episodes, cough, rash anywhere, chest pain, palpitation, shortness of breath, orthopnea, PND, pedal edema, nausea, vomiting, diarrhea, constipation, abdominal pain, acid reflux, active bleeding, black color BM,  burning urination, increase urinary frequency, fall, trauma or injury,  dizziness, difficulty swallowing, focal neurological deficit.   She does not use any aspirin, or NSAIDS at present, no blood thinner.  She is not following any specific diet.   ED Course: pt presented with intractable vomiting, CT abdomen was unremarkable for any acute abnormality and showed large hiatal hernia and constipation. With concern for GI bleed the pt was started on IV protonix infusion, GI was not consulted and pt was referred for admission  At her baseline ambulates with support And is dependent for most of her ADL; does not manages her medication on her own.  Review of Systems: as mentioned in the history of present illness.  All other systems reviewed and are negative.  Past Medical History:  Diagnosis Date  . 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 Allergic rhinitis   . Alzheimer's dementia dx'd 2008  . Diverticulosis    . Fall   . Family history of adverse reaction to anesthesia    daughter reports "either faulty tube crimped or swelling from allergic reaction caused the tube to crimp"  . GERD (gastroesophageal reflux disease)   . Headache    "fragrant related"  . Hiatal hernia   . Memory loss   . Pneumonia 2013  . Radial fracture   . Seasonal allergies    Past Surgical History:  Procedure Laterality Date  . BLADDER SUSPENSION  X 2  . CATARACT EXTRACTION W/ INTRAOCULAR LENS IMPLANT Right   . DILATION AND CURETTAGE OF UTERUS    . FEMUR IM NAIL Right 06/17/2016   Procedure: INTRAMEDULLARY (IM) NAIL FEMORAL;  Surgeon: Sheral Apley, MD;  Location: [facility] OR;  Service: Orthopedics;  Laterality: Right;  . INGUINAL HERNIA REPAIR Right   . PERIPHERAL VASCULAR CATHETERIZATION N/A 05/16/2016   Procedure: Lower Extremity Angiography;  Surgeon: Runell Gess, MD;  Location: [facility] INVASIVE CV LAB;  Service: Cardiovascular;  Laterality: N/A;   Social History:  reports that she has never smoked. She has never used smokeless tobacco. She reports that she does not drink alcohol or use drugs.  Allergies  Allergen Reactions  . Sulfa Antibiotics Other (See Comments)    Reaction:  Unknown   . Tape Other (See Comments)    Reaction:  Tears pts skin     Family History  Problem Relation Age of Onset  . Dementia Mother   . Heart failure Mother   . Heart failure Father   . Parkinsonism Brother  Prior to Admission medications   Medication Sig Start Date End Date Taking? Authorizing Provider  acetaminophen (TYLENOL) 325 MG tablet Take 325 mg by mouth every 6 (six) hours as needed for mild pain.    Yes [provider]  bisacodyl (DULCOLAX) 10 MG suppository Place 1 suppository (10 mg total) rectally daily as needed for moderate constipation. 06/21/16  Yes Richarda OverlieAbrol, Nayana, MD  docusate sodium (COLACE) 100 MG capsule Take 1 capsule (100 mg total) by mouth 2 (two) times daily. 06/21/16  Yes Richarda OverlieAbrol, Nayana, MD    donepezil (ARICEPT) 10 MG tablet Take 10 mg by mouth at bedtime.   Yes [provider]  ferrous sulfate (FERROUSUL) 325 (65 FE) MG tablet Take 1 tablet (325 mg total) by mouth 2 (two) times daily with a meal. 06/21/16  Yes Richarda OverlieAbrol, Nayana, MD  HYDROcodone-acetaminophen (NORCO/VICODIN) 5-325 MG tablet Take 1 tablet by mouth every 6 (six) hours as needed for moderate pain. 06/21/16  Yes Richarda OverlieAbrol, Nayana, MD  Multiple Vitamin (MULTIVITAMIN WITH MINERALS) TABS tablet Take 1 tablet by mouth daily.   Yes [provider]  omega-3 acid ethyl esters (LOVAZA) 1 g capsule Take 1 g by mouth 2 (two) times daily.   Yes [provider]  Skin Protectants, Misc. (EUCERIN) cream Apply 1 application topically 2 (two) times daily as needed for dry skin. Pt applies to both feet.   Yes [provider]  traMADol (ULTRAM) 50 MG tablet Take 50 mg by mouth every 6 (six) hours as needed for moderate pain.    Yes [provider]  Vitamin D, Ergocalciferol, (DRISDOL) 50000 units CAPS capsule Take 50,000 Units by mouth every 30 (thirty) days. Pt takes on the 26th every month.   Yes [provider]    Physical Exam: Vitals:   03/26/17 2323 03/27/17 0140 03/27/17 0348 03/27/17 0542  BP: (!) 152/83 (!) 157/64 (!) 123/55 (!) 143/65  Pulse: 66 73 74 63  Resp: 18 18 (!) 23 20  Temp: 98.3 F (36.8 C)     TempSrc: Oral     SpO2: 95% 95% 95% 93%    General: Alert, Awake and Oriented to Time, Place and Person. Appear in mild distress, affect appropriate Eyes: PERRL, Conjunctiva normal ENT: Oral Mucosa clear moist. Neck: no JVD, no Abnormal Mass Or lumps Cardiovascular: S1 and S2 Present, aortic systolic Murmur, Peripheral Pulses Present Respiratory: normal respiratory effort, Bilateral Air entry equal and Decreased, no use of accessory muscle, faint basal Crackles, Occasional bilateral wheezes Abdomen: Bowel Sound present, Soft and mild diffuse tenderness, no hernia Skin: no  redness, no Rash, no induration Extremities: no Pedal edema, no calf tenderness Neurologic: Grossly no focal neuro deficit. Bilaterally Equal motor strength  Labs on Admission:  CBC:  Recent Labs Lab 03/26/17 2342 03/27/17 0900  WBC 14.7* 8.7  HGB 15.6* 16.0*  HCT 45.1 44.1  MCV 92.0 95.7  PLT 229 192   Basic Metabolic Panel:  Recent Labs Lab 03/26/17 2342  NA 142  K 3.6  CL 100*  CO2 32  GLUCOSE 161*  BUN 15  CREATININE 0.72  CALCIUM 9.5   GFR: CrCl cannot be calculated (Unknown ideal weight.). Liver Function Tests:  Recent Labs Lab 03/26/17 2342  AST 22  ALT 18  ALKPHOS 60  BILITOT 0.6  PROT 7.0  ALBUMIN 4.0    Recent Labs Lab 03/26/17 2342  LIPASE 25   No results for input(s): AMMONIA in the last 168 hours. Coagulation Profile:  Recent Labs Lab  03/26/17 2342  INR 1.00   Cardiac Enzymes: No results for input(s): CKTOTAL, CKMB, CKMBINDEX, TROPONINI in the last 168 hours. BNP (last 3 results) No results for input(s): PROBNP in the last 8760 hours. HbA1C: No results for input(s): HGBA1C in the last 72 hours. CBG: No results for input(s): GLUCAP in the last 168 hours. Lipid Profile: No results for input(s): CHOL, HDL, LDLCALC, TRIG, CHOLHDL, LDLDIRECT in the last 72 hours. Thyroid Function Tests: No results for input(s): TSH, T4TOTAL, FREET4, T3FREE, THYROIDAB in the last 72 hours. Anemia Panel:  Recent Labs  03/27/17 0900  RETICCTPCT 1.7   Urine analysis:    Component Value Date/Time   COLORURINE YELLOW 06/16/2016 1004   APPEARANCEUR CLOUDY (A) 06/16/2016 1004   LABSPEC 1.028 06/16/2016 1004   PHURINE 5.5 06/16/2016 1004   GLUCOSEU NEGATIVE 06/16/2016 1004   HGBUR SMALL (A) 06/16/2016 1004   BILIRUBINUR NEGATIVE 06/16/2016 1004   KETONESUR NEGATIVE 06/16/2016 1004   PROTEINUR NEGATIVE 06/16/2016 1004   UROBILINOGEN 0.2 06/15/2011 1033   NITRITE NEGATIVE 06/16/2016 1004   LEUKOCYTESUR SMALL (A) 06/16/2016 1004     Radiological Exams on Admission: Ct Abdomen Pelvis W Contrast  Result Date: 03/27/2017 CLINICAL DATA:  Initial evaluation for recurrent bloody emesis. EXAM: CT ABDOMEN AND PELVIS WITH CONTRAST TECHNIQUE: Multidetector CT imaging of the abdomen and pelvis was performed using the standard protocol following bolus administration of intravenous contrast. CONTRAST:  100 cc of Isovue-300. COMPARISON:  None available. FINDINGS: Lower chest: Scattered atelectatic changes present within the visualized lung bases. Visualized lungs are otherwise clear. Hepatobiliary: Liver demonstrates a normal contrast enhanced appearance. Cholelithiasis noted. Gallbladder otherwise within normal limits without acute inflammatory changes. No biliary dilatation. Pancreas: Pancreas within normal limits. Spleen: Spleen within normal limits. Adrenals/Urinary Tract: The adrenal glands are normal. Kidneys equal in size with symmetric enhancement. No nephrolithiasis, hydronephrosis, or focal enhancing renal mass. No hydroureter. Bladder without acute abnormality. Small bladder diverticulum noted on the left. Stomach/Bowel: Large hiatal hernia with a good portion the stomach in the thorax. No evidence for obstruction. Small bowel of normal caliber. Appendix normal. Colonic diverticulosis without evidence for acute diverticulitis. No acute inflammatory changes seen about the bowels. Vascular/Lymphatic: Moderate aorto bi-iliac atherosclerotic disease. No aneurysm. Normal intravascular enhancement seen throughout the intra-abdominal aorta and its branch vessels. No adenopathy. Reproductive: Uterus is absent. Small bilateral ovarian cysts measure up to 2 cm on the right. These are most certainly benign given size. Ovaries otherwise unremarkable. Other: No free air or fluid. Musculoskeletal: No acute osseus abnormality. No worrisome lytic or blastic osseous lesions. Fixation hardware present at the right femur. IMPRESSION: 1. Large hiatal hernia.  2. No other acute intra-abdominal or pelvic process. 3. Colonic diverticulosis without evidence for acute diverticulitis. 4. Cholelithiasis. 5. Moderate aorto bi-iliac atherosclerotic disease. Electronically Signed   By: Rise Mu M.D.   On: 03/27/2017 05:13   Dg Chest Port 1 View  Result Date: 03/27/2017 CLINICAL DATA:  Wheezing. EXAM: PORTABLE CHEST 1 VIEW COMPARISON:  06/16/2016 FINDINGS: Heart size is normal. Large hiatal hernia again evident. There is aortic atherosclerosis. The lungs show slightly prominent chronic interstitial markings but no evidence of active infiltrate, collapse or effusion. Azygos fissure is present. No abnormal bone finding. IMPRESSION: Large hiatal hernia. Slightly prominent chronic interstitial lung markings. No active process identified. Electronically Signed   By: Paulina Fusi M.D.   On: 03/27/2017 09:41   Assessment/Plan 1. Principal Problem:   Intractable nausea and vomiting   Acute GI bleeding  Hiatal hernia with GERD and esophagitis   Leucocytosis Likely her intractable nausea and vomiting is from large hiatal hernia and constipation.  Do not see nay drop in her Hb not any evidence of dehydration, which is reassuring.  I do not suspect that she has severe active upper GI bleed. Will stop the Protonix infusion but will still continue Protonix Q12 hours IV.  Monitor Hb for next 4 hours x2 and if stable will advance her diet to clear liquid.  Recommended the pt eat small frequent meals in future and stay upright after meals due to hiatal hernia. Add Carafate and Maalox PRN.   2.constipation  Started on miralax and senna Monitor   3. Dementia Monitor for now Continue home regimen  No delirium or behavioral issues   4. Leucocytosis  Likely stress reaction  No evidence of active infection at present  Already normal.   5. bilateral crackles  Occasional wheeze. PRN duonebs, Likely may have aspiration pneumonitis  No  Infiltrate on CXR.   Monitor for now.   Nutrition: NPO for now DVT Prophylaxis: mechanical compression device  Advance goals of care discussion: DNR DNI   Consults: none  Family Communication: family was present at bedside, at the time of interview.  Opportunity was given to ask question and all questions were answered satisfactorily.  Disposition: Admitted as observation, telemetry unit. Likely to be discharged home, in 1-2 days.  Author: Lynden Oxford, MD [facility] Pager: [phone] 03/27/2017  If 7PM-7AM, please contact night-coverage [email or web address] Password [email or web address]

## 2017-03-28 DIAGNOSIS — N390 Urinary tract infection, site not specified: Secondary | ICD-10-CM

## 2017-03-28 DIAGNOSIS — Z7982 Long term (current) use of aspirin: Secondary | ICD-10-CM | POA: Diagnosis not present

## 2017-03-28 DIAGNOSIS — Z91048 Other nonmedicinal substance allergy status: Secondary | ICD-10-CM | POA: Diagnosis not present

## 2017-03-28 DIAGNOSIS — Z961 Presence of intraocular lens: Secondary | ICD-10-CM | POA: Diagnosis present

## 2017-03-28 DIAGNOSIS — Z515 Encounter for palliative care: Secondary | ICD-10-CM | POA: Diagnosis present

## 2017-03-28 DIAGNOSIS — F0280 Dementia in other diseases classified elsewhere without behavioral disturbance: Secondary | ICD-10-CM

## 2017-03-28 DIAGNOSIS — Z9841 Cataract extraction status, right eye: Secondary | ICD-10-CM | POA: Diagnosis not present

## 2017-03-28 DIAGNOSIS — H353220 Exudative age-related macular degeneration, left eye, stage unspecified: Secondary | ICD-10-CM | POA: Diagnosis present

## 2017-03-28 DIAGNOSIS — Z66 Do not resuscitate: Secondary | ICD-10-CM | POA: Diagnosis present

## 2017-03-28 DIAGNOSIS — K449 Diaphragmatic hernia without obstruction or gangrene: Secondary | ICD-10-CM | POA: Diagnosis present

## 2017-03-28 DIAGNOSIS — R112 Nausea with vomiting, unspecified: Secondary | ICD-10-CM | POA: Diagnosis present

## 2017-03-28 DIAGNOSIS — K922 Gastrointestinal hemorrhage, unspecified: Secondary | ICD-10-CM | POA: Diagnosis present

## 2017-03-28 DIAGNOSIS — R413 Other amnesia: Secondary | ICD-10-CM | POA: Diagnosis present

## 2017-03-28 DIAGNOSIS — K210 Gastro-esophageal reflux disease with esophagitis: Secondary | ICD-10-CM | POA: Diagnosis present

## 2017-03-28 DIAGNOSIS — I739 Peripheral vascular disease, unspecified: Secondary | ICD-10-CM | POA: Diagnosis present

## 2017-03-28 DIAGNOSIS — R062 Wheezing: Secondary | ICD-10-CM | POA: Diagnosis present

## 2017-03-28 DIAGNOSIS — Z882 Allergy status to sulfonamides status: Secondary | ICD-10-CM | POA: Diagnosis not present

## 2017-03-28 DIAGNOSIS — Z888 Allergy status to other drugs, medicaments and biological substances status: Secondary | ICD-10-CM | POA: Diagnosis not present

## 2017-03-28 DIAGNOSIS — K2971 Gastritis, unspecified, with bleeding: Secondary | ICD-10-CM | POA: Diagnosis present

## 2017-03-28 DIAGNOSIS — G309 Alzheimer's disease, unspecified: Secondary | ICD-10-CM | POA: Diagnosis present

## 2017-03-28 DIAGNOSIS — G894 Chronic pain syndrome: Secondary | ICD-10-CM | POA: Diagnosis present

## 2017-03-28 DIAGNOSIS — K5900 Constipation, unspecified: Secondary | ICD-10-CM | POA: Diagnosis present

## 2017-03-28 DIAGNOSIS — Z79899 Other long term (current) drug therapy: Secondary | ICD-10-CM | POA: Diagnosis not present

## 2017-03-28 LAB — CBC
HEMATOCRIT: 37.8 % (ref 36.0–46.0)
HEMOGLOBIN: 12.8 g/dL (ref 12.0–15.0)
MCH: 32.3 pg (ref 26.0–34.0)
MCHC: 33.9 g/dL (ref 30.0–36.0)
MCV: 95.5 fL (ref 78.0–100.0)
Platelets: 182 10*3/uL (ref 150–400)
RBC: 3.96 MIL/uL (ref 3.87–5.11)
RDW: 13 % (ref 11.5–15.5)
WBC: 8.3 10*3/uL (ref 4.0–10.5)

## 2017-03-28 LAB — COMPREHENSIVE METABOLIC PANEL
ALBUMIN: 3.3 g/dL — AB (ref 3.5–5.0)
ALK PHOS: 51 U/L (ref 38–126)
ALT: 15 U/L (ref 14–54)
ANION GAP: 8 (ref 5–15)
AST: 19 U/L (ref 15–41)
BUN: 14 mg/dL (ref 6–20)
CO2: 27 mmol/L (ref 22–32)
Calcium: 8.7 mg/dL — ABNORMAL LOW (ref 8.9–10.3)
Chloride: 108 mmol/L (ref 101–111)
Creatinine, Ser: 0.81 mg/dL (ref 0.44–1.00)
GFR calc Af Amer: >60 mL/min (ref >60)
GFR calc non Af Amer: >60 mL/min (ref >60)
GLUCOSE: 98 mg/dL (ref 65–99)
POTASSIUM: 3.8 mmol/L (ref 3.5–5.1)
SODIUM: 143 mmol/L (ref 135–145)
Total Bilirubin: 0.7 mg/dL (ref 0.3–1.2)
Total Protein: 5.8 g/dL — ABNORMAL LOW (ref 6.5–8.1)

## 2017-03-28 LAB — MAGNESIUM: Magnesium: 2 mg/dL (ref 1.7–2.4)

## 2017-03-28 MED ORDER — DEXTROSE 5 % IV SOLN
1.0000 g | INTRAVENOUS | Status: DC
  Administered 2017-03-28: 1 g via INTRAVENOUS
  Filled 2017-03-28 (×2): qty 10

## 2017-03-28 NOTE — Progress Notes (Signed)
PROGRESS NOTE    Oswald HillockHilda B Checketts  NWG:[id number] DOB: 1924/03/04 DOA: 03/26/2017 PCP: Angela Coxasanayaka, Gayani Y, MD    Brief Narrative:  83 year old female who presented with intractable nausea and vomiting. Patient is known to have a large hiatal hernia, dementia, GERD and 50 cardiovascular disease. She presented after developing severe nausea and vomiting, reported 12 episodes of emesis, many of them coffee-ground. No abdominal pain or diarrhea, no evident orthostatic symptoms. On initial physical examination blood pressure 152/83, heart rate 66, respiratory rate 18, temperature 98.3, oxygen saturation 95%, oral mucosa moist, neck supple, lungs clear to auscultation bilaterally, heart S1-S2 present and rhythmic, abdomen soft nontender, no lotion with the edema. Sodium 142, potassium 3.6, chloride 100, bicarbonate 32, glucose 161, BUN 15, creatinine 0.72, white count 14.7, hemoglobin 15.6, hematocrit 45.1, platelets 229. Urinalysis with too numerous to count white cells. Gastro occult blood positive. CT of the abdomen large hiatal hernia, chest x-ray negative for infiltrates. EKG normal sinus rhythm, premature atrial complexes.   Patient was admitted to the hospital working diagnosis of intractable nausea and vomiting with acute upper GI bleed, rule out Mallory-Weiss tear, complicated by urinary tract infection.   Assessment & Plan:   Principal Problem:   Intractable nausea and vomiting Active Problems:   Dementia   PVD (peripheral vascular disease) (HCC)   Acute GI bleeding   Hiatal hernia with GERD and esophagitis   Leucocytosis   Constipation   1. Intractable nausea and vomiting. Suspected Mallory -Weiss tear to rule out peptic ulcer disease, will continue to follow cell count and symptoms. Will hold on endoscopic procedures for now. Continue IV fluids and antiemetics.   2. Upper GI bleed. Continue IV pantoprazole and clear liquid diet, no signs of bleeding, drop in hb likely due to  hemoconcentration on admission, will continue to follow on cell count and hemodynamics.   3. Dementia. No agitation, patient not very communicative, will continue neuro checks per unit protocol, patient is at high risk for developing delirium.   4. Urine tract infection. Positive pyuria, will follow urine culture, considering patient's clinical scenario, will start on antibiotic therapy with IV ceftriaxone.     DVT prophylaxis: scd Code Status: DNR Family Communication: No family at the bedside  Disposition Plan: home    Consultants:     Procedures:    Antimicrobials:       Subjective: Patient feeling better, no nausea or vomiting, no abdominal pain. Unclear with dark coffee emesis was present from the onset of vomiting, or occurred later. No chest pain or dyspnea. Limited history due to poor cooperation.   Objective: Vitals:   03/27/17 1503 03/27/17 1900 03/27/17 2047 03/28/17 0531  BP: (!) 111/56  (!) 135/58 (!) 154/61  Pulse: 61  90 60  Resp: 18  18 18   Temp: (!) 97.5 F (36.4 C)  97.9 F (36.6 C) 98.4 F (36.9 C)  TempSrc: Oral  Oral Oral  SpO2: 98%  94% 96%  Weight:  69.7 kg (153 lb 10.6 oz)  70 kg (154 lb 5.2 oz)  Height:  5\' 5"  (1.651 m)      Intake/Output Summary (Last 24 hours) at 03/28/17 1212 Last data filed at 03/28/17 0700  Gross per 24 hour  Intake          1833.75 ml  Output              250 ml  Net          1583.75 ml   Ceasar MonsFiled  Weights   03/27/17 1900 03/28/17 0531  Weight: 69.7 kg (153 lb 10.6 oz) 70 kg (154 lb 5.2 oz)    Examination:  General exam: deconditioned E ENT. Mild pallor, no icterus, oral mucosa moist.  Respiratory system: Clear to auscultation. Respiratory effort normal. No wheezing, rales or rhonchi.  Cardiovascular system: S1 & S2 heard, RRR. No JVD, murmurs, rubs, gallops or clicks. No pedal edema. Gastrointestinal system: Abdomen is nondistended, soft and nontender. No organomegaly or masses felt. Normal bowel sounds  heard. Central nervous system: Alert and oriented. No focal neurological deficits. Extremities: Symmetric 5 x 5 power. Skin: No rashes, lesions or ulcers     Data Reviewed: I have personally reviewed following labs and imaging studies  CBC:  Recent Labs Lab 03/26/17 2342 03/27/17 0900 03/27/17 1417 03/27/17 1949 03/28/17 0431  WBC 14.7* 8.7 11.1* 9.4 8.3  HGB 15.6* 16.0* 14.7 13.1 12.8  HCT 45.1 44.1 42.3 38.7 37.8  MCV 92.0 95.7 95.1 95.1 95.5  PLT 229 192 184 204 182   Basic Metabolic Panel:  Recent Labs Lab 03/26/17 2342 03/28/17 0431  NA 142 143  K 3.6 3.8  CL 100* 108  CO2 32 27  GLUCOSE 161* 98  BUN 15 14  CREATININE 0.72 0.81  CALCIUM 9.5 8.7*  MG  --  2.0   GFR: Estimated Creatinine Clearance: 44.4 mL/min (by C-G formula based on SCr of 0.81 mg/dL). Liver Function Tests:  Recent Labs Lab 03/26/17 2342 03/28/17 0431  AST 22 19  ALT 18 15  ALKPHOS 60 51  BILITOT 0.6 0.7  PROT 7.0 5.8*  ALBUMIN 4.0 3.3*    Recent Labs Lab 03/26/17 2342  LIPASE 25   No results for input(s): AMMONIA in the last 168 hours. Coagulation Profile:  Recent Labs Lab 03/26/17 2342  INR 1.00   Cardiac Enzymes: No results for input(s): CKTOTAL, CKMB, CKMBINDEX, TROPONINI in the last 168 hours. BNP (last 3 results) No results for input(s): PROBNP in the last 8760 hours. HbA1C: No results for input(s): HGBA1C in the last 72 hours. CBG: No results for input(s): GLUCAP in the last 168 hours. Lipid Profile: No results for input(s): CHOL, HDL, LDLCALC, TRIG, CHOLHDL, LDLDIRECT in the last 72 hours. Thyroid Function Tests: No results for input(s): TSH, T4TOTAL, FREET4, T3FREE, THYROIDAB in the last 72 hours. Anemia Panel:  Recent Labs  03/27/17 0900  RETICCTPCT 1.7   Sepsis Labs:  Recent Labs Lab 03/27/17 0900 03/27/17 1418  LATICACIDVEN 1.4 1.4    Recent Results (from the past 240 hour(s))  MRSA PCR Screening     Status: None   Collection Time:  03/27/17 10:17 AM  Result Value Ref Range Status   MRSA by PCR NEGATIVE NEGATIVE Final    Comment:        The GeneXpert MRSA Assay (FDA approved for NASAL specimens only), is one component of a comprehensive MRSA colonization surveillance program. It is not intended to diagnose MRSA infection nor to guide or monitor treatment for MRSA infections.          Radiology Studies: Ct Abdomen Pelvis W Contrast  Result Date: 03/27/2017 CLINICAL DATA:  Initial evaluation for recurrent bloody emesis. EXAM: CT ABDOMEN AND PELVIS WITH CONTRAST TECHNIQUE: Multidetector CT imaging of the abdomen and pelvis was performed using the standard protocol following bolus administration of intravenous contrast. CONTRAST:  100 cc of Isovue-300. COMPARISON:  None available. FINDINGS: Lower chest: Scattered atelectatic changes present within the visualized lung bases. Visualized lungs are otherwise  clear. Hepatobiliary: Liver demonstrates a normal contrast enhanced appearance. Cholelithiasis noted. Gallbladder otherwise within normal limits without acute inflammatory changes. No biliary dilatation. Pancreas: Pancreas within normal limits. Spleen: Spleen within normal limits. Adrenals/Urinary Tract: The adrenal glands are normal. Kidneys equal in size with symmetric enhancement. No nephrolithiasis, hydronephrosis, or focal enhancing renal mass. No hydroureter. Bladder without acute abnormality. Small bladder diverticulum noted on the left. Stomach/Bowel: Large hiatal hernia with a good portion the stomach in the thorax. No evidence for obstruction. Small bowel of normal caliber. Appendix normal. Colonic diverticulosis without evidence for acute diverticulitis. No acute inflammatory changes seen about the bowels. Vascular/Lymphatic: Moderate aorto bi-iliac atherosclerotic disease. No aneurysm. Normal intravascular enhancement seen throughout the intra-abdominal aorta and its branch vessels. No adenopathy. Reproductive:  Uterus is absent. Small bilateral ovarian cysts measure up to 2 cm on the right. These are most certainly benign given size. Ovaries otherwise unremarkable. Other: No free air or fluid. Musculoskeletal: No acute osseus abnormality. No worrisome lytic or blastic osseous lesions. Fixation hardware present at the right femur. IMPRESSION: 1. Large hiatal hernia. 2. No other acute intra-abdominal or pelvic process. 3. Colonic diverticulosis without evidence for acute diverticulitis. 4. Cholelithiasis. 5. Moderate aorto bi-iliac atherosclerotic disease. Electronically Signed   By: Rise Mu M.D.   On: 03/27/2017 05:13   Dg Chest Port 1 View  Result Date: 03/27/2017 CLINICAL DATA:  Wheezing. EXAM: PORTABLE CHEST 1 VIEW COMPARISON:  06/16/2016 FINDINGS: Heart size is normal. Large hiatal hernia again evident. There is aortic atherosclerosis. The lungs show slightly prominent chronic interstitial markings but no evidence of active infiltrate, collapse or effusion. Azygos fissure is present. No abnormal bone finding. IMPRESSION: Large hiatal hernia. Slightly prominent chronic interstitial lung markings. No active process identified. Electronically Signed   By: Paulina Fusi M.D.   On: 03/27/2017 09:41        Scheduled Meds: . ferrous sulfate  325 mg Oral BID WC  . pantoprazole (PROTONIX) IV  40 mg Intravenous Q12H  . polyethylene glycol  17 g Oral Daily  . sodium chloride flush  3 mL Intravenous Q12H  . sucralfate  1 g Oral TID WC & HS   Continuous Infusions: . sodium chloride 75 mL/hr at 03/27/17 2235     LOS: 0 days       Coralie Keens, MD [facility] Pager [phone]  If 7PM-7AM, please contact night-coverage [email or web address] Password [email or web address] 03/28/2017, 12:12 PM

## 2017-03-28 NOTE — Clinical Social Work Note (Signed)
Clinical Social Work Assessment  Patient Details  Name: Tami HillockHilda B Harris MRN: [id number] Date of Birth: [birth year]  Date of referral:  03/28/17               Reason for consult:   (pt admitted from facility)                Permission sought to share information with:  Family Supports, Oceanographeracility Contact Representative Permission granted to share information::  Yes, Verbal Permission Granted  Name::     Daughter Clinical cytogeneticistJackie  Agency::  [facility]  Relationship::     Contact Information:     Housing/Transportation Living arrangements for the past 2 months:  [facility] Facility Source of Information:  Adult Children, Facility Patient Interpreter Needed:  None Criminal Activity/Legal Involvement Pertinent to Current Situation/Hospitalization:  No - Comment as needed Significant Relationships:  Adult Children, Merchandiser, retailCommunity Support Lives with:  Facility Resident Do you feel safe going back to the place where you live?  Yes Need for family participation in patient care:  Yes (Comment) (pt with dementia- daughter primary decision maker)  Care giving concerns:  [facility]. Has lived there 10 years per daughter, very pleased with care there. Daughter states "She was diagnosed with Alzheimer's years ago but then it never really progressed. At one point she was actually doing so well they said she could move back home but she loved it there so much she stayed."  Daughter states facility assists pt extensively with ADLs. Pt ambulates with walker and 1 person assist at baseline.    Social Worker assessment / plan:  CSW consulted as pt is admitted from facility- assisted living [facility]. Spoke with daughter and with facility staff. Confirmed pt's plan to return at DC, pt long term resident.  Will provide updated FL2 for facility at DC.  Employment status:  Retired Database administratornsurance information:  Managed Medicare PT Recommendations:  Not assessed at  this time Information / Referral to community resources:     Patient/Family's Response to care:  Engaged and appreciative of care  Patient/Family's Understanding of and Emotional Response to Diagnosis, Current Treatment, and Prognosis:  Demonstrates appropriate and adequate understanding at this stage of planning. Appropriate emotional response. Expresses "love" for her fellow residents and staff at the ALF.   Emotional Assessment Appearance:  Appears stated age Attitude/Demeanor/Rapport:   (appropriate, drowsy) Affect (typically observed):  Calm Orientation:  Oriented to Self, Oriented to Place, Oriented to Situation Alcohol / Substance use:  Not Applicable Psych involvement (Current and /or in the community):  No (Comment)  Discharge Needs  Concerns to be addressed:  Discharge Planning Concerns Readmission within the last 30 days:  No Current discharge risk:  None Barriers to Discharge:  Continued Medical Work up   Terex CorporationMeghan R Collyn Ribas, LCSW 03/28/2017, 8:51 AM  [phone]

## 2017-03-29 LAB — BASIC METABOLIC PANEL
ANION GAP: 9 (ref 5–15)
BUN: 10 mg/dL (ref 6–20)
CALCIUM: 8.6 mg/dL — AB (ref 8.9–10.3)
CHLORIDE: 106 mmol/L (ref 101–111)
CO2: 28 mmol/L (ref 22–32)
CREATININE: 0.72 mg/dL (ref 0.44–1.00)
GFR calc non Af Amer: >60 mL/min (ref >60)
Glucose, Bld: 89 mg/dL (ref 65–99)
Potassium: 3.7 mmol/L (ref 3.5–5.1)
SODIUM: 143 mmol/L (ref 135–145)

## 2017-03-29 LAB — CBC
HCT: 39 % (ref 36.0–46.0)
Hemoglobin: 13.2 g/dL (ref 12.0–15.0)
MCH: 32.1 pg (ref 26.0–34.0)
MCHC: 33.8 g/dL (ref 30.0–36.0)
MCV: 94.9 fL (ref 78.0–100.0)
Platelets: 179 10*3/uL (ref 150–400)
RBC: 4.11 MIL/uL (ref 3.87–5.11)
RDW: 12.6 % (ref 11.5–15.5)
WBC: 8.5 10*3/uL (ref 4.0–10.5)

## 2017-03-29 LAB — URINE CULTURE

## 2017-03-29 MED ORDER — TRAMADOL HCL 50 MG PO TABS: 50.0000 mg | ORAL_TABLET | Freq: Four times a day (QID) | ORAL | 0 refills | Status: DC | PRN

## 2017-03-29 MED ORDER — HYDROCODONE-ACETAMINOPHEN 5-325 MG PO TABS: 1.0000 | ORAL_TABLET | Freq: Four times a day (QID) | ORAL | 0 refills | Status: DC | PRN

## 2017-03-29 MED ORDER — PANTOPRAZOLE SODIUM 40 MG PO TBEC: 40.0000 mg | DELAYED_RELEASE_TABLET | Freq: Two times a day (BID) | ORAL | 0 refills | Status: DC

## 2017-03-29 MED ORDER — CEPHALEXIN 500 MG PO CAPS: 500.0000 mg | ORAL_CAPSULE | Freq: Two times a day (BID) | ORAL | 0 refills | Status: AC

## 2017-03-29 MED ORDER — SUCRALFATE 1 GM/10ML PO SUSP: 1.0000 g | Freq: Three times a day (TID) | ORAL | 0 refills | Status: DC

## 2017-03-29 NOTE — NC FL2 (Signed)
MEDICAID FL2 LEVEL OF CARE SCREENING TOOL     IDENTIFICATION  Patient Name: Tami HillockHilda B Mccandlish Birthdate: 04/25/1924 Sex: female Admission Date (Current Location): 03/26/2017  [facility] [place]   Facility and Address:  [facility],  [address], [place] [id number]      Provider Number: [id number]  Attending Physician Name and Address:  Coralie KeensArrien, Mauricio Daniel,*  Relative Name and Phone Number:       Current Level of Care: Hospital Recommended Level of Care: Assisted Living Facility Prior Approval Number:    Date Approved/Denied:   PASRR Number: [id number]  Discharge Plan: Other (Comment) (assisted living facility)    Current Diagnoses: Patient Active Problem List   Diagnosis Date Noted  . GI bleed 03/28/2017  . Acute GI bleeding 03/27/2017  . Hiatal hernia with GERD and esophagitis 03/27/2017  . Leucocytosis 03/27/2017  . Constipation 03/27/2017  . Intractable nausea and vomiting 03/27/2017  . Palliative care encounter   . Closed right hip fracture (HCC) 06/16/2016  . Hip fracture (HCC) 06/16/2016  . Goals of care, counseling/discussion   . Advance care planning   . Palliative care by specialist   . Fall 04/18/2016  . Distal radius fracture 04/18/2016  . Syncope 04/18/2016  . PVD (peripheral vascular disease) (HCC)   . Critical lower limb ischemia 04/08/2016  . 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 Pneumonia 10/18/2011  . Dementia 10/18/2011    Orientation RESPIRATION BLADDER Height & Weight     Self, Place, Situation  Normal Continent Weight: 156 lb 1.4 oz (70.8 kg) Height:  5\' 5"  (165.1 cm)  BEHAVIORAL SYMPTOMS/MOOD NEUROLOGICAL BOWEL NUTRITION STATUS      Continent Diet (regular diet)  AMBULATORY STATUS COMMUNICATION OF NEEDS Skin   Limited Assist (uses rolling walker, sometimes need for 1 person assist) Verbally Normal                       Personal Care Assistance Level of Assistance  Bathing,  Feeding, Dressing Bathing Assistance: Limited assistance Feeding assistance: Limited assistance Dressing Assistance: Limited assistance     Functional Limitations Info  Sight, Hearing, Speech Sight Info: Adequate Hearing Info: Adequate Speech Info: Adequate    SPECIAL CARE FACTORS FREQUENCY  PT (By licensed PT), OT (By licensed OT)     PT Frequency: 3x OT Frequency: 3x            Contractures Contractures Info: Not present    Additional Factors Info  Allergies, Code Status Code Status Info: DNR Allergies Info: Sulfa antibiotics, tape           Current Medications (03/29/2017):  This is the current hospital active medication list Current Facility-Administered Medications  Medication Dose Route Frequency Provider Last Rate Last Dose  . 0.9 %  sodium chloride infusion   Intravenous Continuous Rolly SalterPatel, Pranav M, MD 75 mL/hr at 03/29/17 1019    . acetaminophen (TYLENOL) suppository 650 mg  650 mg Rectal Q6H PRN Rolly SalterPatel, Pranav M, MD      . acetaminophen (TYLENOL) tablet 650 mg  650 mg Oral Q6H PRN Rolly SalterPatel, Pranav M, MD      . alum & mag hydroxide-simeth (MAALOX/MYLANTA) 200-200-20 MG/5ML suspension 15 mL  15 mL Oral Q4H PRN Rolly SalterPatel, Pranav M, MD      . cefTRIAXone (ROCEPHIN) 1 g in dextrose 5 % 50 mL IVPB  1 g Intravenous Q24H Coralie KeensArrien, Mauricio Daniel, MD   Stopped at 03/28/17 1436  . ferrous sulfate  tablet 325 mg  325 mg Oral BID WC Rolly Salter, MD   325 mg at 03/29/17 0825  . ipratropium-albuterol (DUONEB) 0.5-2.5 (3) MG/3ML nebulizer solution 3 mL  3 mL Nebulization Q6H PRN Rolly Salter, MD      . ondansetron [facility]) tablet 4 mg  4 mg Oral Q6H PRN Rolly Salter, MD       Or  . ondansetron [facility]) injection 4 mg  4 mg Intravenous Q6H PRN Rolly Salter, MD      . pantoprazole (PROTONIX) injection 40 mg  40 mg Intravenous Q12H Rolly Salter, MD   40 mg at 03/29/17 1017  . polyethylene glycol (MIRALAX / GLYCOLAX) packet 17 g  17 g Oral Daily Rolly Salter, MD   17 g at  03/29/17 1017  . sodium chloride flush (NS) 0.9 % injection 3 mL  3 mL Intravenous Q12H Rolly Salter, MD   3 mL at 03/27/17 2222  . sucralfate (CARAFATE) 1 GM/10ML suspension 1 g  1 g Oral TID WC & HS Rolly Salter, MD   1 g at 03/29/17 0825  . traMADol (ULTRAM) tablet 50 mg  50 mg Oral Q6H PRN Rolly Salter, MD         Discharge Medications: Please see discharge summary for a list of discharge medications.  Relevant Imaging Results:  Relevant Lab Results:   Additional Information SS#: [id number]. Pt needs Home Health nursing/ skilled nursing to follow at facility  [facility], LCSW

## 2017-03-29 NOTE — Discharge Summary (Signed)
Physician Discharge Summary  INDIANA GAMERO ZOX:[id number] DOB: 08-31-1924 DOA: 03/26/2017  PCP: Angela Cox, MD  Admit date: 03/26/2017 Discharge date: 03/29/2017  Admitted From: SNF Disposition: SNF  Recommendations for Outpatient Follow-up:  1. Follow up with PCP in 1-2 weeks 2. Please obtain BMP/CBC in one week 3. Please follow up on the following pending results:  Home Health:NA  Equipment/Devices: NA  Discharge Condition: Stable  CODE STATUS: Full  Diet recommendation: regular   Brief/Interim Summary: 83 year old female who presented with intractable nausea and vomiting. Patient is known to have a large hiatal hernia, dementia, GERD and peripheral vascular disease. She presented after developing severe nausea and vomiting, reported 12 episodes of emesis, many of them coffee-ground. No abdominal pain or diarrhea, no evident orthostatic symptoms. On initial physical examination blood pressure 152/83, heart rate 66, respiratory rate 18, temperature 98.3, oxygen saturation 95%, oral mucosa moist, neck supple, lungs clear to auscultation bilaterally, heart S1-S2 present and rhythmic, abdomen soft nontender, no lower extremity edema. Sodium 142, potassium 3.6, chloride 100, bicarbonate 32, glucose 161, BUN 15, creatinine 0.72, white count 14.7, hemoglobin 15.6, hematocrit 45.1, platelets 229. Urinalysis with too numerous to count white cells. Gastro occult blood positive. CT of the abdomen large hiatal hernia, chest x-ray negative for infiltrates. EKG normal sinus rhythm, premature atrial complexes.   Patient was admitted to the hospital working diagnosis of intractable nausea and vomiting with acute upper GI bleed, rule out Mallory-Weiss tear, complicated by urinary tract infection.  1. Intractable nausea and vomiting. Patient was admitted to the medical unit, she was placed on IV fluids, IV antiemetics and IV antiacids. Her diet was advanced progressively with good toleration. No  further nausea or vomiting while in the hospital.   2. Upper GI bleed, presumed gastritis, rule out Mallory-Weiss tear. Patient remained hemodynamically stable, her initial hemoglobin was 15.6, presumed to be hemoconcentrated due to dehydration, after IV fluids her hemoglobin went back to its presumed baseline of 13.2. No signs of active bleeding. Patient tolerated well medical therapy with antiacids. She will be discharged on pantoprazole 40 mg twice daily and sucralfate close follow-up as an outpatient.  3. Urinary tract infection. Urinalysis with positive pyuria, urine culture pending report. It is likely that GI symptoms might be related to her urinary tract infection. Patient will be discharged on cephalexin for the next 7 days, she received intravenous ceftriaxone while hospitalized. Patient has remained afebrile with no significant leukocytosis.   4. Dementia. Patient had no agitation, she will continue donepezil.  5. Chronic pain syndrome. Patient will continue tramadol and hydrocodone as per home regimen. Continue Colace and Dulcolax.    Discharge Diagnoses:  Principal Problem:   Intractable nausea and vomiting Active Problems:   Dementia   PVD (peripheral vascular disease) (HCC)   Acute GI bleeding   Hiatal hernia with GERD and esophagitis   Leucocytosis   Constipation   GI bleed    Discharge Instructions   Allergies as of 03/29/2017      Reactions   Sulfa Antibiotics Other (See Comments)   Reaction:  Unknown    Tape Other (See Comments)   Reaction:  Tears pts skin       Medication List    TAKE these medications   acetaminophen 325 MG tablet Commonly known as:  TYLENOL Take 325 mg by mouth every 6 (six) hours as needed for mild pain.   bisacodyl 10 MG suppository Commonly known as:  DULCOLAX Place 1 suppository (10 mg total) rectally  daily as needed for moderate constipation.   cephALEXin 500 MG capsule Commonly known as:  KEFLEX Take 1 capsule (500 mg total)  by mouth 2 (two) times daily.   docusate sodium 100 MG capsule Commonly known as:  COLACE Take 1 capsule (100 mg total) by mouth 2 (two) times daily.   donepezil 10 MG tablet Commonly known as:  ARICEPT Take 10 mg by mouth at bedtime.   eucerin cream Apply 1 application topically 2 (two) times daily as needed for dry skin. Pt applies to both feet.   ferrous sulfate 325 (65 FE) MG tablet Commonly known as:  FERROUSUL Take 1 tablet (325 mg total) by mouth 2 (two) times daily with a meal.   HYDROcodone-acetaminophen 5-325 MG tablet Commonly known as:  NORCO/VICODIN Take 1 tablet by mouth every 6 (six) hours as needed for moderate pain.   multivitamin with minerals Tabs tablet Take 1 tablet by mouth daily.   omega-3 acid ethyl esters 1 g capsule Commonly known as:  LOVAZA Take 1 g by mouth 2 (two) times daily.   pantoprazole 40 MG tablet Commonly known as:  PROTONIX Take 1 tablet (40 mg total) by mouth 2 (two) times daily.   sucralfate 1 GM/10ML suspension Commonly known as:  CARAFATE Take 10 mLs (1 g total) by mouth 4 (four) times daily -  with meals and at bedtime.   traMADol 50 MG tablet Commonly known as:  ULTRAM Take 1 tablet (50 mg total) by mouth every 6 (six) hours as needed for moderate pain.   Vitamin D (Ergocalciferol) 50000 units Caps capsule Commonly known as:  DRISDOL Take 50,000 Units by mouth every 30 (thirty) days. Pt takes on the 26th every month.       Allergies  Allergen Reactions  . Sulfa Antibiotics Other (See Comments)    Reaction:  Unknown   . Tape Other (See Comments)    Reaction:  Tears pts skin     Consultations:     Procedures/Studies: Ct Abdomen Pelvis W Contrast  Result Date: 03/27/2017 CLINICAL DATA:  Initial evaluation for recurrent bloody emesis. EXAM: CT ABDOMEN AND PELVIS WITH CONTRAST TECHNIQUE: Multidetector CT imaging of the abdomen and pelvis was performed using the standard protocol following bolus administration of  intravenous contrast. CONTRAST:  100 cc of Isovue-300. COMPARISON:  None available. FINDINGS: Lower chest: Scattered atelectatic changes present within the visualized lung bases. Visualized lungs are otherwise clear. Hepatobiliary: Liver demonstrates a normal contrast enhanced appearance. Cholelithiasis noted. Gallbladder otherwise within normal limits without acute inflammatory changes. No biliary dilatation. Pancreas: Pancreas within normal limits. Spleen: Spleen within normal limits. Adrenals/Urinary Tract: The adrenal glands are normal. Kidneys equal in size with symmetric enhancement. No nephrolithiasis, hydronephrosis, or focal enhancing renal mass. No hydroureter. Bladder without acute abnormality. Small bladder diverticulum noted on the left. Stomach/Bowel: Large hiatal hernia with a good portion the stomach in the thorax. No evidence for obstruction. Small bowel of normal caliber. Appendix normal. Colonic diverticulosis without evidence for acute diverticulitis. No acute inflammatory changes seen about the bowels. Vascular/Lymphatic: Moderate aorto bi-iliac atherosclerotic disease. No aneurysm. Normal intravascular enhancement seen throughout the intra-abdominal aorta and its branch vessels. No adenopathy. Reproductive: Uterus is absent. Small bilateral ovarian cysts measure up to 2 cm on the right. These are most certainly benign given size. Ovaries otherwise unremarkable. Other: No free air or fluid. Musculoskeletal: No acute osseus abnormality. No worrisome lytic or blastic osseous lesions. Fixation hardware present at the right femur. IMPRESSION: 1. Large hiatal  hernia. 2. No other acute intra-abdominal or pelvic process. 3. Colonic diverticulosis without evidence for acute diverticulitis. 4. Cholelithiasis. 5. Moderate aorto bi-iliac atherosclerotic disease. Electronically Signed   By: Rise Mu M.D.   On: 03/27/2017 05:13   Dg Chest Port 1 View  Result Date: 03/27/2017 CLINICAL DATA:   Wheezing. EXAM: PORTABLE CHEST 1 VIEW COMPARISON:  06/16/2016 FINDINGS: Heart size is normal. Large hiatal hernia again evident. There is aortic atherosclerosis. The lungs show slightly prominent chronic interstitial markings but no evidence of active infiltrate, collapse or effusion. Azygos fissure is present. No abnormal bone finding. IMPRESSION: Large hiatal hernia. Slightly prominent chronic interstitial lung markings. No active process identified. Electronically Signed   By: Paulina Fusi M.D.   On: 03/27/2017 09:41      Subjective: Patient is feeling better, no nausea or vomiting, no lightheadedness or dizziness.  Discharge Exam: Vitals:   03/28/17 2110 03/29/17 0548  BP: (!) 159/62 (!) 167/55  Pulse: 60 (!) 55  Resp: 20 18  Temp: 97.8 F (36.6 C) 98 F (36.7 C)   Vitals:   03/28/17 0531 03/28/17 1336 03/28/17 2110 03/29/17 0548  BP: (!) 154/61 (!) 149/63 (!) 159/62 (!) 167/55  Pulse: 60 (!) 59 60 (!) 55  Resp: 18 18 20 18   Temp: 98.4 F (36.9 C) 98.3 F (36.8 C) 97.8 F (36.6 C) 98 F (36.7 C)  TempSrc: Oral Oral Oral Oral  SpO2: 96% 94% 95% 96%  Weight: 70 kg (154 lb 5.2 oz)   70.8 kg (156 lb 1.4 oz)  Height:        General: Pt is alert, awake, not in acute distress E ENT: moist mucous membranes.  Cardiovascular: RRR, S1/S2 +, no rubs, no gallops Respiratory: CTA bilaterally, no wheezing, no rhonchi Abdominal: Soft, NT, ND, bowel sounds + Extremities: no edema, no cyanosis    The results of significant diagnostics from this hospitalization (including imaging, microbiology, ancillary and laboratory) are listed below for reference.     Microbiology: Recent Results (from the past 240 hour(s))  MRSA PCR Screening     Status: None   Collection Time: 03/27/17 10:17 AM  Result Value Ref Range Status   MRSA by PCR NEGATIVE NEGATIVE Final    Comment:        The GeneXpert MRSA Assay (FDA approved for NASAL specimens only), is one component of a comprehensive MRSA  colonization surveillance program. It is not intended to diagnose MRSA infection nor to guide or monitor treatment for MRSA infections.      Labs: BNP (last 3 results) No results for input(s): BNP in the last 8760 hours. Basic Metabolic Panel:  Recent Labs Lab 03/26/17 2342 03/28/17 0431 03/29/17 0442  NA 142 143 143  K 3.6 3.8 3.7  CL 100* 108 106  CO2 32 27 28  GLUCOSE 161* 98 89  BUN 15 14 10   CREATININE 0.72 0.81 0.72  CALCIUM 9.5 8.7* 8.6*  MG  --  2.0  --    Liver Function Tests:  Recent Labs Lab 03/26/17 2342 03/28/17 0431  AST 22 19  ALT 18 15  ALKPHOS 60 51  BILITOT 0.6 0.7  PROT 7.0 5.8*  ALBUMIN 4.0 3.3*    Recent Labs Lab 03/26/17 2342  LIPASE 25   No results for input(s): AMMONIA in the last 168 hours. CBC:  Recent Labs Lab 03/27/17 0900 03/27/17 1417 03/27/17 1949 03/28/17 0431 03/29/17 0442  WBC 8.7 11.1* 9.4 8.3 8.5  HGB 16.0* 14.7 13.1  12.8 13.2  HCT 44.1 42.3 38.7 37.8 39.0  MCV 95.7 95.1 95.1 95.5 94.9  PLT 192 184 204 182 179   Cardiac Enzymes: No results for input(s): CKTOTAL, CKMB, CKMBINDEX, TROPONINI in the last 168 hours. BNP: Invalid input(s): POCBNP CBG: No results for input(s): GLUCAP in the last 168 hours. D-Dimer No results for input(s): DDIMER in the last 72 hours. Hgb A1c No results for input(s): HGBA1C in the last 72 hours. Lipid Profile No results for input(s): CHOL, HDL, LDLCALC, TRIG, CHOLHDL, LDLDIRECT in the last 72 hours. Thyroid function studies No results for input(s): TSH, T4TOTAL, T3FREE, THYROIDAB in the last 72 hours.  Invalid input(s): FREET3 Anemia work up  Recent Labs  03/27/17 0900  RETICCTPCT 1.7   Urinalysis    Component Value Date/Time   COLORURINE YELLOW 03/26/2017 2342   APPEARANCEUR CLOUDY (A) 03/26/2017 2342   LABSPEC 1.042 (H) 03/26/2017 2342   PHURINE 5.0 03/26/2017 2342   GLUCOSEU NEGATIVE 03/26/2017 2342   HGBUR NEGATIVE 03/26/2017 2342   BILIRUBINUR NEGATIVE  03/26/2017 2342   KETONESUR NEGATIVE 03/26/2017 2342   PROTEINUR NEGATIVE 03/26/2017 2342   UROBILINOGEN 0.2 06/15/2011 1033   NITRITE NEGATIVE 03/26/2017 2342   LEUKOCYTESUR LARGE (A) 03/26/2017 2342   Sepsis Labs Invalid input(s): PROCALCITONIN,  WBC,  LACTICIDVEN Microbiology Recent Results (from the past 240 hour(s))  MRSA PCR Screening     Status: None   Collection Time: 03/27/17 10:17 AM  Result Value Ref Range Status   MRSA by PCR NEGATIVE NEGATIVE Final    Comment:        The GeneXpert MRSA Assay (FDA approved for NASAL specimens only), is one component of a comprehensive MRSA colonization surveillance program. It is not intended to diagnose MRSA infection nor to guide or monitor treatment for MRSA infections.      Time coordinating discharge: 45 minutes  SIGNED:   Coralie KeensMauricio Daniel Arrien, MD  [facility] 03/29/2017, 10:13 AM Pager   If 7PM-7AM, please contact night-coverage [email or web address] Password [email or web address]

## 2017-03-29 NOTE — Progress Notes (Signed)
CSW met with pt and daughter at bedside- plan to have pt DC today to return to her ALF [facility]. Provided facility pt's updated information via the [place]- facility to call back once reviewed and to advise when facility prepared to have pt return today. Daughter states she will transport pt in her personal vehicle at DC.  Sharren Bridge, MSW, LCSW Clinical Social Work 03/29/2017 [phone]

## 2017-03-29 NOTE — Progress Notes (Signed)
[facility],RN. Packet given to patient's daughter. [facility].

## 2017-03-29 NOTE — Evaluation (Signed)
Physical Therapy Evaluation Patient Details Name: Tami Harris MRN: [id number] DOB: Jan 07, 1924 Today's Date: 03/29/2017   History of Present Illness  83 yo female admitted with N/V, GI bleed. Hx of R hip IM nail, hiatal hernia, PVD, dementia, partially blind L eye  Clinical Impression  On eval, pt required Min assist for mobility. She walked ~115 feet with a RW. Audible wheezing noted-O2 sat 92% on RA during ambulation. Pt presents with general weakness, decreased activity tolerance, and impaired gait and balance. Recommend HHPT at ALF as long as staff at facility can provide current level of care.     Follow Up Recommendations Home health PT;Supervision/Assistance - 24 hour (at ALF as long as staff at facility can provide current level of care)    Equipment Recommendations  None recommended by PT    Recommendations for Other Services       Precautions / Restrictions Precautions Precautions: Fall Restrictions Weight Bearing Restrictions: No      Mobility  Bed Mobility Overal bed mobility: Needs Assistance Bed Mobility: Supine to Sit     Supine to sit: [facility] elevated;Min assist     General bed mobility comments: Assist for trunk.   Transfers Overall transfer level: Needs assistance Equipment used: Rolling walker (2 wheeled) Transfers: Sit to/from Stand Sit to Stand: Min assist         General transfer comment: Assist to rise, stabilize, control descent. VCs safety, hand placement.   Ambulation/Gait Ambulation/Gait assistance: Min assist Ambulation Distance (Feet): 115 Feet Assistive device: Rolling walker (2 wheeled) Gait Pattern/deviations: Step-through pattern;Decreased stride length;Trunk flexed     General Gait Details: Assist to stabilize pt and maneuver safely with RW. Audible wheezing noted-O2 sat 92% on RA.   Stairs            Wheelchair Mobility    Modified Rankin (Stroke Patients Only)       Balance Overall balance assessment: Needs  assistance;History of Falls         Standing balance support: Bilateral upper extremity supported Standing balance-Leahy Scale: Poor                               Pertinent Vitals/Pain Pain Assessment: No/denies pain    Home Living Family/patient expects to be discharged to:: Assisted living               Home Equipment: Walker - 2 wheels      Prior Function Level of Independence: Needs assistance   Gait / Transfers Assistance Needed: Pt walked with a walker also used w/c to go to dining room           Hand Dominance        Extremity/Trunk Assessment   Upper Extremity Assessment Upper Extremity Assessment: Generalized weakness    Lower Extremity Assessment Lower Extremity Assessment: Generalized weakness    Cervical / Trunk Assessment Cervical / Trunk Assessment: Kyphotic  Communication   Communication: HOH  Cognition Arousal/Alertness: Awake/alert Behavior During Therapy: WFL for tasks assessed/performed Overall Cognitive Status: Within Functional Limits for tasks assessed                                        General Comments      Exercises     Assessment/Plan    PT Assessment Patient needs continued PT services  PT Problem List  Decreased strength;Decreased mobility;Decreased activity tolerance;Decreased balance;Decreased knowledge of use of DME;Decreased cognition       PT Treatment Interventions DME instruction;Gait training;Therapeutic activities;Therapeutic exercise;Patient/family education;Functional mobility training;Balance training    PT Goals (Current goals can be found in the Care Plan section)  Acute Rehab PT Goals Patient Stated Goal: "to go back to my room" PT Goal Formulation: With patient Time For Goal Achievement: 04/12/17 Potential to Achieve Goals: Good    Frequency Min 3X/week   Barriers to discharge        Co-evaluation               AM-PAC PT "6 Clicks" Daily Activity   Outcome Measure Difficulty turning over in bed (including adjusting bedclothes, sheets and blankets)?: Total Difficulty moving from lying on back to sitting on the side of the bed? : Total Difficulty sitting down on and standing up from a chair with arms (e.g., wheelchair, bedside commode, etc,.)?: Total Help needed moving to and from a bed to chair (including a wheelchair)?: A Little Help needed walking in hospital room?: A Little Help needed climbing 3-5 steps with a railing? : A Little 6 Click Score: 12    End of Session Equipment Utilized During Treatment: Gait belt Activity Tolerance: Patient tolerated treatment well Patient left: in bed;with call bell/phone within reach;with bed alarm set   PT Visit Diagnosis: Muscle weakness (generalized) (M62.81);Difficulty in walking, not elsewhere classified (R26.2)    Time: [id number] PT Time Calculation (min) (ACUTE ONLY): 21 min   Charges:   PT Evaluation $PT Eval Low Complexity: 1 Procedure     PT G Codes:          Rebeca AlertJannie Theran Vandergrift, MPT Pager: [phone]

## 2017-06-25 ENCOUNTER — Emergency Department (HOSPITAL_COMMUNITY): Payer: Medicare Other

## 2017-06-25 ENCOUNTER — Encounter (HOSPITAL_COMMUNITY): Payer: Self-pay | Admitting: Emergency Medicine

## 2017-06-25 ENCOUNTER — Observation Stay (HOSPITAL_COMMUNITY)
Admission: EM | Admit: 2017-06-25 | Discharge: 2017-06-26 | Disposition: A | Discharge status: Skilled Nursing Facility | Payer: Medicare Other | Attending: Internal Medicine | Admitting: Internal Medicine

## 2017-06-25 DIAGNOSIS — G9340 Encephalopathy, unspecified: Secondary | ICD-10-CM | POA: Diagnosis not present

## 2017-06-25 DIAGNOSIS — Z79899 Other long term (current) drug therapy: Secondary | ICD-10-CM | POA: Insufficient documentation

## 2017-06-25 DIAGNOSIS — Z9181 History of falling: Secondary | ICD-10-CM | POA: Insufficient documentation

## 2017-06-25 DIAGNOSIS — E876 Hypokalemia: Secondary | ICD-10-CM | POA: Insufficient documentation

## 2017-06-25 DIAGNOSIS — R8271 Bacteriuria: Secondary | ICD-10-CM | POA: Diagnosis not present

## 2017-06-25 DIAGNOSIS — K219 Gastro-esophageal reflux disease without esophagitis: Secondary | ICD-10-CM | POA: Insufficient documentation

## 2017-06-25 DIAGNOSIS — S3992XA Unspecified injury of lower back, initial encounter: Secondary | ICD-10-CM

## 2017-06-25 DIAGNOSIS — M7918 Myalgia, other site: Secondary | ICD-10-CM

## 2017-06-25 DIAGNOSIS — I10 Essential (primary) hypertension: Secondary | ICD-10-CM

## 2017-06-25 DIAGNOSIS — I160 Hypertensive urgency: Secondary | ICD-10-CM | POA: Insufficient documentation

## 2017-06-25 DIAGNOSIS — R4182 Altered mental status, unspecified: Secondary | ICD-10-CM | POA: Diagnosis present

## 2017-06-25 DIAGNOSIS — F0390 Unspecified dementia without behavioral disturbance: Secondary | ICD-10-CM | POA: Diagnosis present

## 2017-06-25 DIAGNOSIS — G309 Alzheimer's disease, unspecified: Secondary | ICD-10-CM | POA: Diagnosis not present

## 2017-06-25 DIAGNOSIS — F0280 Dementia in other diseases classified elsewhere without behavioral disturbance: Secondary | ICD-10-CM | POA: Diagnosis not present

## 2017-06-25 DIAGNOSIS — F05 Delirium due to known physiological condition: Secondary | ICD-10-CM

## 2017-06-25 LAB — URINALYSIS, ROUTINE W REFLEX MICROSCOPIC
Bilirubin Urine: NEGATIVE
Glucose, UA: NEGATIVE mg/dL
Ketones, ur: NEGATIVE mg/dL
NITRITE: NEGATIVE
PH: 5 (ref 5.0–8.0)
Protein, ur: 30 mg/dL — AB
SPECIFIC GRAVITY, URINE: 1.02 (ref 1.005–1.030)

## 2017-06-25 LAB — CBC
HCT: 43.7 % (ref 36.0–46.0)
Hemoglobin: 14.9 g/dL (ref 12.0–15.0)
MCH: 32.2 pg (ref 26.0–34.0)
MCHC: 34.1 g/dL (ref 30.0–36.0)
MCV: 94.4 fL (ref 78.0–100.0)
PLATELETS: 198 10*3/uL (ref 150–400)
RBC: 4.63 MIL/uL (ref 3.87–5.11)
RDW: 13 % (ref 11.5–15.5)
WBC: 9.2 10*3/uL (ref 4.0–10.5)

## 2017-06-25 LAB — COMPREHENSIVE METABOLIC PANEL
ALK PHOS: 69 U/L (ref 38–126)
ALT: 16 U/L (ref 14–54)
AST: 27 U/L (ref 15–41)
Albumin: 4.1 g/dL (ref 3.5–5.0)
Anion gap: 12 (ref 5–15)
BILIRUBIN TOTAL: 0.7 mg/dL (ref 0.3–1.2)
BUN: 11 mg/dL (ref 6–20)
CALCIUM: 9.4 mg/dL (ref 8.9–10.3)
CHLORIDE: 101 mmol/L (ref 101–111)
CO2: 27 mmol/L (ref 22–32)
CREATININE: 0.77 mg/dL (ref 0.44–1.00)
Glucose, Bld: 101 mg/dL — ABNORMAL HIGH (ref 65–99)
Potassium: 3.5 mmol/L (ref 3.5–5.1)
Sodium: 140 mmol/L (ref 135–145)
Total Protein: 7.2 g/dL (ref 6.5–8.1)

## 2017-06-25 LAB — BRAIN NATRIURETIC PEPTIDE: B Natriuretic Peptide: 91.5 pg/mL (ref 0.0–100.0)

## 2017-06-25 LAB — CBG MONITORING, ED: GLUCOSE-CAPILLARY: 103 mg/dL — AB (ref 65–99)

## 2017-06-25 LAB — TROPONIN I

## 2017-06-25 MED ORDER — HYDROCERIN EX CREA: 1.0000 "application " | TOPICAL_CREAM | Freq: Two times a day (BID) | CUTANEOUS | Status: DC | PRN

## 2017-06-25 MED ORDER — VITAMIN D (ERGOCALCIFEROL) 1.25 MG (50000 UNIT) PO CAPS: 50000.0000 [IU] | ORAL_CAPSULE | ORAL | Status: DC

## 2017-06-25 MED ORDER — ADULT MULTIVITAMIN W/MINERALS CH
1.0000 | ORAL_TABLET | Freq: Every day | ORAL | Status: DC
  Administered 2017-06-25 – 2017-06-26 (×2): 1 via ORAL
  Filled 2017-06-25 (×2): qty 1

## 2017-06-25 MED ORDER — DEXTROSE 5 % IV SOLN
1.0000 g | Freq: Once | INTRAVENOUS | Status: AC
  Administered 2017-06-25: 1 g via INTRAVENOUS
  Filled 2017-06-25: qty 10

## 2017-06-25 MED ORDER — HYDRALAZINE HCL 20 MG/ML IJ SOLN
5.0000 mg | Freq: Four times a day (QID) | INTRAMUSCULAR | Status: DC | PRN
  Administered 2017-06-26: 5 mg via INTRAVENOUS
  Filled 2017-06-25: qty 1

## 2017-06-25 MED ORDER — FERROUS SULFATE 325 (65 FE) MG PO TABS
325.0000 mg | ORAL_TABLET | Freq: Two times a day (BID) | ORAL | Status: DC
  Administered 2017-06-26: 325 mg via ORAL
  Filled 2017-06-25: qty 1

## 2017-06-25 MED ORDER — DONEPEZIL HCL 10 MG PO TABS
10.0000 mg | ORAL_TABLET | Freq: Every day | ORAL | Status: DC
  Administered 2017-06-25: 10 mg via ORAL
  Filled 2017-06-25: qty 1

## 2017-06-25 MED ORDER — DOCUSATE SODIUM 100 MG PO CAPS
100.0000 mg | ORAL_CAPSULE | Freq: Two times a day (BID) | ORAL | Status: DC
  Administered 2017-06-25 – 2017-06-26 (×2): 100 mg via ORAL
  Filled 2017-06-25 (×2): qty 1

## 2017-06-25 MED ORDER — ACETAMINOPHEN 325 MG PO TABS: 325.0000 mg | ORAL_TABLET | Freq: Four times a day (QID) | ORAL | Status: DC | PRN

## 2017-06-25 MED ORDER — ENOXAPARIN SODIUM 40 MG/0.4ML ~~LOC~~ SOLN
40.0000 mg | Freq: Every day | SUBCUTANEOUS | Status: DC
  Administered 2017-06-25: 40 mg via SUBCUTANEOUS
  Filled 2017-06-25: qty 0.4

## 2017-06-25 MED ORDER — SODIUM CHLORIDE 0.9 % IV BOLUS (SEPSIS)
1000.0000 mL | Freq: Once | INTRAVENOUS | Status: AC
  Administered 2017-06-25: 1000 mL via INTRAVENOUS

## 2017-06-25 MED ORDER — SUCRALFATE 1 GM/10ML PO SUSP
1.0000 g | Freq: Three times a day (TID) | ORAL | Status: DC
  Administered 2017-06-25 – 2017-06-26 (×3): 1 g via ORAL
  Filled 2017-06-25 (×3): qty 10

## 2017-06-25 MED ORDER — OMEGA-3-ACID ETHYL ESTERS 1 G PO CAPS
1.0000 g | ORAL_CAPSULE | Freq: Two times a day (BID) | ORAL | Status: DC
  Administered 2017-06-25 – 2017-06-26 (×2): 1 g via ORAL
  Filled 2017-06-25 (×2): qty 1

## 2017-06-25 MED ORDER — BISACODYL 10 MG RE SUPP: 10.0000 mg | Freq: Every day | RECTAL | Status: DC | PRN

## 2017-06-25 NOTE — H&P (Signed)
TRH H&P   Patient Demographics:    Tami Harris, is a 83 y.o. female  MRN: [id number]   DOB - [birth year]  Admit Date - 06/25/2017  Outpatient Primary MD for the patient is Dasanayaka, Newton Pigg, MD  Referring MD/NP/PA: Marily Memos  Outpatient Specialists:  Margarita Rana (ortho)? Nita Sickle (neurology) Patient coming from: [facility]  Chief Complaint  Patient presents with  . Altered Mental Status      HPI:    Tami Harris  is a 83 y.o. female, w dementia, apparently was doing fine when last nite the ALF staff called her 3 times to say that her mother is having significant behavioral disturbances, and was much more combative.  This history is all from ER note because of the patients significant dementia and fact that daughter is not present.  ER noted slight left facial droop, and requested admission.      Review of systems:    In addition to the HPI above, unclear due to Dementia, but patient doesn't appear to have any complaints.  No Fever-chills, No Headache, No changes with Vision or hearing, No problems swallowing food or Liquids, No Chest pain, Cough or Shortness of Breath, No Abdominal pain, No Nausea or Vommitting, Bowel movements are regular, No Blood in stool or Urine, No dysuria, No new skin rashes or bruises, No new joints pains-aches,  No new weakness, tingling, numbness in any extremity, No recent weight gain or loss, No polyuria, polydypsia or polyphagia, No significant Mental Stressors.  A full 10 point Review of Systems was done, except as stated above, all other Review of Systems were negative.   With Past History of the following :    Past Medical History:  Diagnosis Date  . 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 Allergic rhinitis   . Alzheimer's dementia dx'd 2008  .  Diverticulosis   . Fall   . Family history of adverse reaction to anesthesia    daughter reports "either faulty tube crimped or swelling from allergic reaction caused the tube to crimp"  . GERD (gastroesophageal reflux disease)   . Headache    "fragrant related"  . Hiatal hernia   . Memory loss   . Pneumonia 2013  . Radial fracture   . Seasonal allergies       Past Surgical History:  Procedure Laterality Date  . BLADDER SUSPENSION  X 2  . CATARACT EXTRACTION W/ INTRAOCULAR LENS IMPLANT Right   . DILATION AND CURETTAGE OF UTERUS    . FEMUR IM NAIL Right 06/17/2016   Procedure: INTRAMEDULLARY (IM) NAIL FEMORAL;  Surgeon: Sheral Apley, MD;  Location: [facility] OR;  Service: Orthopedics;  Laterality: Right;  . INGUINAL HERNIA REPAIR Right   . PERIPHERAL VASCULAR CATHETERIZATION N/A 05/16/2016   Procedure: Lower Extremity Angiography;  Surgeon: Runell Gess,  MD;  Location: [facility] INVASIVE CV LAB;  Service: Cardiovascular;  Laterality: N/A;      Social History:     Social History  Substance Use Topics  . Smoking status: Never Smoker  . Smokeless tobacco: Never Used  . Alcohol use No     Lives - at [facility]  Mobility - unclear   Family History :     Family History  Problem Relation Age of Onset  . Dementia Mother   . Heart failure Mother   . Heart failure Father   . Parkinsonism Brother      Home Medications:   Prior to Admission medications   Medication Sig Start Date End Date Taking? Authorizing Provider  acetaminophen (TYLENOL) 325 MG tablet Take 325 mg by mouth every 6 (six) hours as needed for mild pain.     [provider]  bisacodyl (DULCOLAX) 10 MG suppository Place 1 suppository (10 mg total) rectally daily as needed for moderate constipation. 06/21/16   Richarda Overlie, MD  docusate sodium (COLACE) 100 MG capsule Take 1 capsule (100 mg total) by mouth 2 (two) times daily. 06/21/16   Richarda Overlie, MD  donepezil (ARICEPT) 10 MG tablet Take 10 mg by  mouth at bedtime.    [provider]  ferrous sulfate (FERROUSUL) 325 (65 FE) MG tablet Take 1 tablet (325 mg total) by mouth 2 (two) times daily with a meal. 06/21/16   Richarda Overlie, MD  HYDROcodone-acetaminophen (NORCO/VICODIN) 5-325 MG tablet Take 1 tablet by mouth every 6 (six) hours as needed for moderate pain. 03/29/17   Arrien, York Ram, MD  Multiple Vitamin (MULTIVITAMIN WITH MINERALS) TABS tablet Take 1 tablet by mouth daily.    [provider]  omega-3 acid ethyl esters (LOVAZA) 1 g capsule Take 1 g by mouth 2 (two) times daily.    [provider]  pantoprazole (PROTONIX) 40 MG tablet Take 1 tablet (40 mg total) by mouth 2 (two) times daily. 03/29/17 04/12/17  Arrien, York Ram, MD  Skin Protectants, Misc. (EUCERIN) cream Apply 1 application topically 2 (two) times daily as needed for dry skin. Pt applies to both feet.    [provider]  sucralfate (CARAFATE) 1 GM/10ML suspension Take 10 mLs (1 g total) by mouth 4 (four) times daily -  with meals and at bedtime. 03/29/17   Arrien, York Ram, MD  traMADol (ULTRAM) 50 MG tablet Take 1 tablet (50 mg total) by mouth every 6 (six) hours as needed for moderate pain. 03/29/17   Arrien, York Ram, MD  Vitamin D, Ergocalciferol, (DRISDOL) 50000 units CAPS capsule Take 50,000 Units by mouth every 30 (thirty) days. Pt takes on the 26th every month.    [provider]     Allergies:     Allergies  Allergen Reactions  . Sulfa Antibiotics Other (See Comments)    Reaction:  Unknown   . Tape Other (See Comments)    Reaction:  Tears pts skin      Physical Exam:   Vitals  Blood pressure (!) 170/75, pulse 70, temperature 98.1 F (36.7 C), temperature source Oral, resp. rate (!) 23, SpO2 93 %.   1. General  lying in bed in NAD,   2. Normal affect and insight, Not Suicidal or Homicidal, Awake Alert, Oriented X 1  3. No F.N deficits, ALL C.Nerves Intact, Strength 5/5 all 4  extremities, Sensation intact all 4 extremities, Plantars down going.  4. Ears and Eyes appear Normal, Conjunctivae clear, PERRLA. Moist  Oral Mucosa.  5. Supple Neck, No JVD, No cervical lymphadenopathy appriciated, No Carotid Bruits.  6. Symmetrical Chest wall movement, Good air movement bilaterally, CTAB.  7. RRR, No Gallops, Rubs or Murmurs, No Parasternal Heave.  8. Positive Bowel Sounds, Abdomen Soft, No tenderness, No organomegaly appriciated,No rebound -guarding or rigidity.  9.  No Cyanosis, Normal Skin Turgor, No Skin Rash or Bruise.  10. Good muscle tone,  joints appear normal , no effusions, Normal ROM.  11. No Palpable Lymph Nodes in Neck or Axillae     Data Review:    CBC  Recent Labs Lab 06/25/17 1539  WBC 9.2  HGB 14.9  HCT 43.7  PLT 198  MCV 94.4  MCH 32.2  MCHC 34.1  RDW 13.0   ------------------------------------------------------------------------------------------------------------------  Chemistries   Recent Labs Lab 06/25/17 1539  NA 140  K 3.5  CL 101  CO2 27  GLUCOSE 101*  BUN 11  CREATININE 0.77  CALCIUM 9.4  AST 27  ALT 16  ALKPHOS 69  BILITOT 0.7   ------------------------------------------------------------------------------------------------------------------ CrCl cannot be calculated (Unknown ideal weight.). ------------------------------------------------------------------------------------------------------------------ No results for input(s): TSH, T4TOTAL, T3FREE, THYROIDAB in the last 72 hours.  Invalid input(s): FREET3  Coagulation profile No results for input(s): INR, PROTIME in the last 168 hours. ------------------------------------------------------------------------------------------------------------------- No results for input(s): DDIMER in the last 72 hours. -------------------------------------------------------------------------------------------------------------------  Cardiac Enzymes  Recent  Labs Lab 06/25/17 1515  TROPONINI <0.03   ------------------------------------------------------------------------------------------------------------------    Component Value Date/Time   BNP 91.5 06/25/2017 1515     ---------------------------------------------------------------------------------------------------------------  Urinalysis    Component Value Date/Time   COLORURINE AMBER (A) 06/25/2017 1143   APPEARANCEUR CLOUDY (A) 06/25/2017 1143   LABSPEC 1.020 06/25/2017 1143   PHURINE 5.0 06/25/2017 1143   GLUCOSEU NEGATIVE 06/25/2017 1143   HGBUR SMALL (A) 06/25/2017 1143   BILIRUBINUR NEGATIVE 06/25/2017 1143   KETONESUR NEGATIVE 06/25/2017 1143   PROTEINUR 30 (A) 06/25/2017 1143   UROBILINOGEN 0.2 06/15/2011 1033   NITRITE NEGATIVE 06/25/2017 1143   LEUKOCYTESUR TRACE (A) 06/25/2017 1143    ----------------------------------------------------------------------------------------------------------------   Imaging Results:    Dg Chest 2 View  Result Date: 06/25/2017 CLINICAL DATA:  83 y/o  F; unwitnessed fall.  Dyspnea and crackles. EXAM: CHEST  2 VIEW COMPARISON:  03/27/2017 chest radiograph. FINDINGS: Stable normal cardiac silhouette given projection and technique. Large hiatal hernia. Aortic atherosclerosis with calcification. Diffuse bronchitic changes. No focal consolidation. Stable mild biapical pleuroparenchymal scarring. Azygos fissure noted. No pleural effusion or pneumothorax. No acute osseous abnormality is evident. IMPRESSION: Diffuse bronchitic changes. No focal consolidation. Large hiatal hernia. Aortic atherosclerosis. Electronically Signed   By: Mitzi Hansen M.D.   On: 06/25/2017 16:37   Dg Pelvis 1-2 Views  Result Date: 06/25/2017 CLINICAL DATA:  Unwitnessed fall.  Tailbone pain. EXAM: PELVIS - 1-2 VIEW COMPARISON:  03/27/2017 CT abdomen/pelvis. FINDINGS: Partially visualized intramedullary rod in the proximal right femur with interlocking  right femoral neck pin, with no evidence of hardware fracture or loosening. Healed deformity in the proximal right femur. No pelvic fracture or diastasis. Diffuse osteopenia. No evidence of hip malalignment on this single frontal view. No suspicious focal osseous lesions. Degenerative changes in the visualized lower lumbar spine. IMPRESSION: No pelvic fracture. Electronically Signed   By: Delbert Phenix M.D.   On: 06/25/2017 16:37   Dg Sacrum/coccyx  Result Date: 06/25/2017 CLINICAL DATA:  Unwitnessed fall.  Tailbone pain. EXAM: SACRUM AND COCCYX - 2+ VIEW COMPARISON:  03/27/2017 CT abdomen/pelvis. FINDINGS: Diffuse osteopenia. No fracture  or suspicious focal osseous lesion. Moderate degenerative changes in the visualized lumbar spine. Partially visualized screw in the right femoral neck. IMPRESSION: No fracture. Electronically Signed   By: Delbert Phenix M.D.   On: 06/25/2017 16:40   Ct Head Wo Contrast  Result Date: 06/25/2017 CLINICAL DATA:  83 year old female with history of dementia. Confused and combative. Nausea. EXAM: CT HEAD WITHOUT CONTRAST TECHNIQUE: Contiguous axial images were obtained from the base of the skull through the vertex without intravenous contrast. COMPARISON:  Head CT 06/08/2016. FINDINGS: Comment: Today's study is limited by considerable patient motion. Brain: Mild cerebral atrophy. Patchy and confluent areas of decreased attenuation are noted throughout the deep and periventricular white matter of the cerebral hemispheres bilaterally, compatible with chronic microvascular ischemic disease. No evidence of acute infarction, hemorrhage, hydrocephalus, extra-axial collection or mass lesion/mass effect. Vascular: No hyperdense vessel or unexpected calcification. Skull: Normal. Negative for fracture or focal lesion. Sinuses/Orbits: No acute finding. Other: None. IMPRESSION: 1. No acute intracranial abnormalities. 2. Mild cerebral atrophy with chronic microvascular ischemic changes in  cerebral white matter. Electronically Signed   By: Trudie Reed M.D.   On: 06/25/2017 16:37      Assessment & Plan:    Principal Problem:   Altered mental status Active Problems:   Dementia   Hypertension     AMS Probably secondary to dementia,  Unclear what her baseline is, but patient doesn't appear agitated at this time.  No left facial droop on exam MRI brain in am r/o CVA  Bacteriuria Pt received rocephin x1 in the ED.   Dementia Cont aricept   DVT Prophylaxis Lovenox - SCDs   AM Labs Ordered, also please review Full Orders  Family Communication: Admission, patients condition and plan of care including tests being ordered have been discussed with the patient who indicate understanding and agree with the plan and Code Status.  Code Status FULL CODE  Likely DC to  ALF, [place]  Condition GUARDED   Consults called: none  Admission status: observation   Time spent in minutes : 45   Pearson Grippe M.D on 06/25/2017 at 8:28 PM  Between 7am to 7pm - Pager - [phone]. After 7pm go to [email or web address] - password [facility]  [facility] - Office  [phone]

## 2017-06-25 NOTE — ED Notes (Signed)
Call report to [facility], RN at 21:20  [phone]

## 2017-06-25 NOTE — ED Provider Notes (Signed)
Emergency Department Provider Note   I have reviewed the triage vital signs and the nursing notes.   HISTORY  Chief Complaint Altered Mental Status   HPI Tami Harris is a 83 y.o. female history of significant Alzheimer's, GI bleed and allergies presents to the emergency department with an acute change in her status. The daughter helps with history and states that she has been doing fine for the most part last night the assisted-living facility staff called her 3 times to say that her mother is having significant behavioral disturbances and was being more combative and had personality changes that were very much unlike her. The daughter says nothing is ever happened before she never had a phone call from assisted living to this effect in the past. Review of systems she does relay that in the past she had a fall in the bathroom and had sacral pain since that time. She also had some difficulty speaking over the last couple weeks that she just has "mumbled words". Today she also had an episode of drinking a milkshake where she had difficulty and had some ingested sounded breathing after eating a milkshake which is also new for her. She did not have any cough during this episode for 70 nausea or vomiting. On exam noticed that she had a left facial droop and when asked the daughter about this she does not recall seeing her before.  Level V caveat secondary to dementia.   Past Medical History:  Diagnosis Date  . 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 Allergic rhinitis   . Alzheimer's dementia dx'd 2008  . Diverticulosis   . Fall   . Family history of adverse reaction to anesthesia    daughter reports "either faulty tube crimped or swelling from allergic reaction caused the tube to crimp"  . GERD (gastroesophageal reflux disease)   . Headache    "fragrant related"  . Hiatal hernia   . Memory loss   . Pneumonia 2013  . Radial  fracture   . Seasonal allergies     Patient Active Problem List   Diagnosis Date Noted  . GI bleed 03/28/2017  . Acute GI bleeding 03/27/2017  . Hiatal hernia with GERD and esophagitis 03/27/2017  . Leucocytosis 03/27/2017  . Constipation 03/27/2017  . Intractable nausea and vomiting 03/27/2017  . Palliative care encounter   . Closed right hip fracture (HCC) 06/16/2016  . Hip fracture (HCC) 06/16/2016  . Goals of care, counseling/discussion   . Advance care planning   . Palliative care by specialist   . Fall 04/18/2016  . Distal radius fracture 04/18/2016  . Syncope 04/18/2016  . PVD (peripheral vascular disease) (HCC)   . Critical lower limb ischemia 04/08/2016  . 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 Pneumonia 10/18/2011  . Dementia 10/18/2011    Past Surgical History:  Procedure Laterality Date  . BLADDER SUSPENSION  X 2  . CATARACT EXTRACTION W/ INTRAOCULAR LENS IMPLANT Right   . DILATION AND CURETTAGE OF UTERUS    . FEMUR IM NAIL Right 06/17/2016   Procedure: INTRAMEDULLARY (IM) NAIL FEMORAL;  Surgeon: Sheral Apley, MD;  Location: [facility] OR;  Service: Orthopedics;  Laterality: Right;  . INGUINAL HERNIA REPAIR Right   . PERIPHERAL VASCULAR CATHETERIZATION N/A 05/16/2016   Procedure: Lower Extremity Angiography;  Surgeon: Runell Gess, MD;  Location: [facility] INVASIVE CV LAB;  Service: Cardiovascular;  Laterality: N/A;    Current Outpatient Rx  .  Order #: [id number] Class: Historical Med  . Order #: [id number] Class: Normal  . Order #: [id number] Class: Normal  . Order #: [id number] Class: Historical Med  . Order #: [id number] Class: Normal  . Order #: [id number] Class: Print  . Order #: [id number] Class: Historical Med  . Order #: [id number] Class: Historical Med  . Order #: [id number] Class: Normal  . Order #: [id number] Class: Historical Med  . Order #: [id number] Class: Normal  . Order #: [id number] Class: Normal  . Order #: [id number] Class: Historical Med    Allergies Sulfa  antibiotics and Tape  Family History  Problem Relation Age of Onset  . Dementia Mother   . Heart failure Mother   . Heart failure Father   . Parkinsonism Brother     Social History Social History  Substance Use Topics  . Smoking status: Never Smoker  . Smokeless tobacco: Never Used  . Alcohol use No    Review of Systems  Level V caveat secondary to dementia.  ____________________________________________   PHYSICAL EXAM:  VITAL SIGNS: ED Triage Vitals [06/25/17 1212]  Enc Vitals Group     BP (!) 149/66     Pulse Rate 79     Resp 18     Temp 98.1 F (36.7 C)     Temp Source Oral     SpO2 92 %     Weight      Height      Head Circumference      Peak Flow      Pain Score      Pain Loc      Pain Edu?      Excl. in GC?     Constitutional: Alert and oriented. Well appearing and in no acute distress. Eyes: Conjunctivae are normal. PERRL. EOMI. Head: Atraumatic. Nose: No congestion/rhinnorhea. Mouth/Throat: Mucous membranes are moist.  Oropharynx non-erythematous. Neck: No stridor.  No meningeal signs.   Cardiovascular: Normal rate, regular rhythm. Good peripheral circulation. Grossly normal heart sounds.   Respiratory: Normal respiratory effort.  No retractions. Lungs with diffuse mild crackles. Gastrointestinal: Soft and nontender. No distention.  Musculoskeletal: No lower extremity tenderness nor edema. No gross deformities of extremities. Neurologic:  Normal speech and language. Left facial droop. Decreased but symmetric strength in all extremities. No ataxia.  Skin:  Skin is warm, dry and intact. No rash noted.   ____________________________________________   LABS (all labs ordered are listed, but only abnormal results are displayed)  Labs Reviewed  URINALYSIS, ROUTINE W REFLEX MICROSCOPIC - Abnormal; Notable for the following:       Result Value   Color, Urine AMBER (*)    APPearance CLOUDY (*)    Hgb urine dipstick SMALL (*)    Protein, ur 30 (*)      Leukocytes, UA TRACE (*)    Bacteria, UA MANY (*)    Squamous Epithelial / LPF 0-5 (*)    All other components within normal limits  CBG MONITORING, ED - Abnormal; Notable for the following:    Glucose-Capillary 103 (*)    All other components within normal limits  CBC  COMPREHENSIVE METABOLIC PANEL  TROPONIN I  BRAIN NATRIURETIC PEPTIDE   ____________________________________________  EKG   EKG Interpretation  Date/Time:    Ventricular Rate:    PR Interval:    QRS Duration:   QT Interval:    QTC Calculation:   R Axis:     Text Interpretation:         ____________________________________________  RADIOLOGY  No results found.  ____________________________________________   PROCEDURES  Procedure(s) performed:   Procedures   ____________________________________________   INITIAL IMPRESSION / ASSESSMENT AND PLAN / ED COURSE  Pertinent labs & imaging results that were available during my care of the patient were reviewed by me and considered in my medical decision making (see chart for details).  UTI v CVA v metabolic cause for symptoms. Fluids/rocephin  Workup relatively unremarkable aside from likely UTI. Likely needs MRI to r/o stroke along with observation to ensure normal mental status. Will d/w hospitalist regarding admission.  ____________________________________________  FINAL CLINICAL IMPRESSION(S) / ED DIAGNOSES  Final diagnoses:  Traumatic buttock pain     MEDICATIONS GIVEN DURING THIS VISIT:  Medications  sodium chloride 0.9 % bolus 1,000 mL (1,000 mLs Intravenous New Bag/Given 06/25/17 1606)     NEW OUTPATIENT MEDICATIONS STARTED DURING THIS VISIT:  New Prescriptions   No medications on file    Note:  This document was prepared using Dragon voice recognition software and may include unintentional dictation errors.   Kumari Sculley, Barbara Cower, MD 06/26/17 [phone]

## 2017-06-25 NOTE — ED Triage Notes (Addendum)
Pt brought in by family from [place] nursing facility. Pt has hx of dementia, but normally is AxO 4x. Family reports the facility told her the patient was confused and combative last night. Pt calm and cooperative when family arrived. Pt had fall several days ago on her tailbone, and has been having pain from this. Pt denies dizziness, lightheadedness, or dysuria. Pt also complains of nausea.

## 2017-06-25 NOTE — ED Notes (Signed)
No respiratory or acute distress noted alert and talking call light in reach no reaction to medication noted. 

## 2017-06-25 NOTE — ED Notes (Signed)
Pt informed about current wait. 

## 2017-06-26 ENCOUNTER — Observation Stay (HOSPITAL_COMMUNITY): Payer: Medicare Other

## 2017-06-26 DIAGNOSIS — R400 Somnolence: Secondary | ICD-10-CM | POA: Diagnosis not present

## 2017-06-26 DIAGNOSIS — R4182 Altered mental status, unspecified: Secondary | ICD-10-CM

## 2017-06-26 DIAGNOSIS — I10 Essential (primary) hypertension: Secondary | ICD-10-CM | POA: Diagnosis not present

## 2017-06-26 LAB — CBC
HEMATOCRIT: 37.6 % (ref 36.0–46.0)
HEMOGLOBIN: 12.5 g/dL (ref 12.0–15.0)
MCH: 31.6 pg (ref 26.0–34.0)
MCHC: 33.2 g/dL (ref 30.0–36.0)
MCV: 94.9 fL (ref 78.0–100.0)
Platelets: 171 10*3/uL (ref 150–400)
RBC: 3.96 MIL/uL (ref 3.87–5.11)
RDW: 13 % (ref 11.5–15.5)
WBC: 7.1 10*3/uL (ref 4.0–10.5)

## 2017-06-26 LAB — COMPREHENSIVE METABOLIC PANEL
ALBUMIN: 3.1 g/dL — AB (ref 3.5–5.0)
ALK PHOS: 52 U/L (ref 38–126)
ALT: 14 U/L (ref 14–54)
ANION GAP: 11 (ref 5–15)
AST: 20 U/L (ref 15–41)
BILIRUBIN TOTAL: 0.8 mg/dL (ref 0.3–1.2)
BUN: 8 mg/dL (ref 6–20)
CALCIUM: 8.6 mg/dL — AB (ref 8.9–10.3)
CO2: 27 mmol/L (ref 22–32)
Chloride: 104 mmol/L (ref 101–111)
Creatinine, Ser: 0.7 mg/dL (ref 0.44–1.00)
GFR calc Af Amer: >60 mL/min (ref >60)
GFR calc non Af Amer: >60 mL/min (ref >60)
GLUCOSE: 96 mg/dL (ref 65–99)
POTASSIUM: 3.1 mmol/L — AB (ref 3.5–5.1)
SODIUM: 142 mmol/L (ref 135–145)
TOTAL PROTEIN: 5.7 g/dL — AB (ref 6.5–8.1)

## 2017-06-26 LAB — MAGNESIUM: MAGNESIUM: 2 mg/dL (ref 1.7–2.4)

## 2017-06-26 LAB — MRSA PCR SCREENING: MRSA by PCR: POSITIVE — AB

## 2017-06-26 MED ORDER — ASPIRIN EC 81 MG PO TBEC: 81.0000 mg | DELAYED_RELEASE_TABLET | Freq: Every day | ORAL | 2 refills | Status: DC

## 2017-06-26 MED ORDER — OMEPRAZOLE 40 MG PO CPDR: 40.0000 mg | DELAYED_RELEASE_CAPSULE | Freq: Every day | ORAL | 1 refills | Status: DC

## 2017-06-26 MED ORDER — POTASSIUM CHLORIDE CRYS ER 20 MEQ PO TBCR
40.0000 meq | EXTENDED_RELEASE_TABLET | Freq: Two times a day (BID) | ORAL | Status: DC
  Administered 2017-06-26: 40 meq via ORAL
  Filled 2017-06-26: qty 2

## 2017-06-26 MED ORDER — POTASSIUM CHLORIDE CRYS ER 20 MEQ PO TBCR: 20.0000 meq | EXTENDED_RELEASE_TABLET | Freq: Every day | ORAL | 1 refills | Status: DC

## 2017-06-26 MED ORDER — POTASSIUM CHLORIDE CRYS ER 20 MEQ PO TBCR
40.0000 meq | EXTENDED_RELEASE_TABLET | Freq: Once | ORAL | Status: AC
  Administered 2017-06-26: 40 meq via ORAL
  Filled 2017-06-26: qty 2

## 2017-06-26 MED ORDER — CHLORHEXIDINE GLUCONATE CLOTH 2 % EX PADS
6.0000 | MEDICATED_PAD | Freq: Every day | CUTANEOUS | Status: DC
  Administered 2017-06-26: 6 via TOPICAL

## 2017-06-26 MED ORDER — MUPIROCIN 2 % EX OINT
1.0000 "application " | TOPICAL_OINTMENT | Freq: Two times a day (BID) | CUTANEOUS | Status: DC
  Administered 2017-06-26: 1 via NASAL
  Filled 2017-06-26: qty 22

## 2017-06-26 NOTE — Evaluation (Signed)
Physical Therapy Evaluation Patient Details Name: Tami Harris MRN: [id number] DOB: 04-Mar-1924 Today's Date: 06/26/2017   History of Present Illness  83 y.o. female with PMH of R femur IM nail 05/2016, paritally blind L eye, PVD, HH, dementia, apparently was doing fine when last nite the ALF staff called her 3 times to say that her mother is having significant behavioral disturbances, and was much more combative. Slight facial droop noted in ER, imaging negative for CVA. UA and CXR negative.   Clinical Impression  Pt required min A for supine to sit and min/guard assist to ambulate 18' with RW, distance limited by fatigue. Pt is ready to return to ALF from PT standpoint. Min A for mobility recommended as well as HHPT. Pt poor historian, so her prior level of function is not known.     Follow Up Recommendations Home health PT;Supervision for mobility/OOB    Equipment Recommendations  None recommended by PT    Recommendations for Other Services       Precautions / Restrictions Precautions Precautions: Fall Restrictions Weight Bearing Restrictions: No      Mobility  Bed Mobility Overal bed mobility: Needs Assistance Bed Mobility: Supine to Sit     Supine to sit: Min assist     General bed mobility comments: min A to raise trunk  Transfers Overall transfer level: Needs assistance Equipment used: Rolling walker (2 wheeled) Transfers: Sit to/from Stand Sit to Stand: Min assist         General transfer comment: VCs hand placement, min A to rise  Ambulation/Gait Ambulation/Gait assistance: Min guard Ambulation Distance (Feet): 18 Feet Assistive device: Rolling walker (2 wheeled) Gait Pattern/deviations: Step-through pattern;Decreased stride length   Gait velocity interpretation: Below normal speed for age/gender General Gait Details: steady with RW, distance limited by fatigue  Stairs            Wheelchair Mobility    Modified Rankin (Stroke Patients  Only)       Balance Overall balance assessment: Modified Independent                                           Pertinent Vitals/Pain Pain Assessment: Faces Faces Pain Scale: No hurt    Home Living Family/patient expects to be discharged to:: Assisted living               Home Equipment: Walker - 2 wheels      Prior Function Level of Independence: Needs assistance   Gait / Transfers Assistance Needed: Pt walked with a walker also used w/c to go to dining room -per PT note 03/29/17, pt poor historian, stated "I don't know" when asked about PLOF           Hand Dominance        Extremity/Trunk Assessment   Upper Extremity Assessment Upper Extremity Assessment: Overall WFL for tasks assessed    Lower Extremity Assessment Lower Extremity Assessment: Overall WFL for tasks assessed    Cervical / Trunk Assessment Cervical / Trunk Assessment: Normal  Communication   Communication: HOH  Cognition Arousal/Alertness: Awake/alert Behavior During Therapy: WFL for tasks assessed/performed Overall Cognitive Status: No family/caregiver present to determine baseline cognitive functioning                                 General Comments:  oriented to self and location, not to month/year, situation      General Comments      Exercises     Assessment/Plan    PT Assessment All further PT needs can be met in the next venue of care (pt to DC to ALF today)  PT Problem List Decreased mobility;Decreased activity tolerance       PT Treatment Interventions      PT Goals (Current goals can be found in the Care Plan section)  Acute Rehab PT Goals PT Goal Formulation: All assessment and education complete, DC therapy    Frequency     Barriers to discharge        Co-evaluation               AM-PAC PT "6 Clicks" Daily Activity  Outcome Measure Difficulty turning over in bed (including adjusting bedclothes, sheets and  blankets)?: A Little Difficulty moving from lying on back to sitting on the side of the bed? : Unable Difficulty sitting down on and standing up from a chair with arms (e.g., wheelchair, bedside commode, etc,.)?: Unable Help needed moving to and from a bed to chair (including a wheelchair)?: A Little Help needed walking in hospital room?: A Little Help needed climbing 3-5 steps with a railing? : A Lot 6 Click Score: 13    End of Session Equipment Utilized During Treatment: Gait belt Activity Tolerance: Patient tolerated treatment well Patient left: in chair;with call bell/phone within reach;with chair alarm set Nurse Communication: Mobility status PT Visit Diagnosis: Difficulty in walking, not elsewhere classified (R26.2)    Time: 5102-5852 PT Time Calculation (min) (ACUTE ONLY): 17 min   Charges:   PT Evaluation $PT Eval Low Complexity: 1 Low     PT G Codes:   PT G-Codes **NOT FOR INPATIENT CLASS** Functional Assessment Tool Used: AM-PAC 6 Clicks Basic Mobility Functional Limitation: Mobility: Walking and moving around Mobility: Walking and Moving Around Current Status (D7824): At least 40 percent but less than 60 percent impaired, limited or restricted Mobility: Walking and Moving Around Goal Status [phone]): At least 40 percent but less than 60 percent impaired, limited or restricted      Philomena Doheny 06/26/2017, 2:29 PM [phone]

## 2017-06-26 NOTE — Clinical Social Work Note (Signed)
Clinical Social Work Assessment  Patient Details  Name: Tami Harris MRN: [id number] Date of Birth: 27-Dec-1923  Date of referral:  06/26/17               Reason for consult:  Facility Placement                Permission sought to share information with:  Facility Industrial/product designer granted to share information::  Yes, Verbal Permission Granted  Name::        Agency::     Relationship::     Contact Information:     Housing/Transportation Living arrangements for the past 2 months:  Assisted Living Facility Source of Information:  Adult Children Patient Interpreter Needed:  None Criminal Activity/Legal Involvement Pertinent to Current Situation/Hospitalization:  No - Comment as needed Significant Relationships:  Adult Children Lives with:  Facility Resident Do you feel safe going back to the place where you live?  Yes Need for family participation in patient care:  Yes (Comment)  Care giving concerns:  Patient from [facility]. Patient's daughter reported that the plan is for patient to return to current ALF.   Social Worker assessment / plan:  CSW spoke with patient's daughter Tami Harris [phone]) about patient's discharge planning. Patient's daughter reported that the plan is for patient to return to current ALF and that she would transport patient. CSW agreed to contact facility and confirm patient's ability to return. CSW will complete FL2 and send to ALF. CSW will continue to follow and assist with dc planning.   Employment status:  Retired Database administrator PT Recommendations:  Not assessed at this time Information / Referral to community resources:     Patient/Family's Response to care:  Patient's daughter appreciative of CSW assistance with dc planning and agreeable to plan to dc back to current ALF.  Patient/Family's Understanding of and Emotional Response to Diagnosis, Current Treatment, and Prognosis:   Patient's daughter verbalized understanding of patient's diagnosis and current treatment. Patient's daughter reported that patient was admitted with AMS and that patient has returned to baseline. Patient was asleep and unable to participate in assessment.   Emotional Assessment Appearance:  Appears stated age Attitude/Demeanor/Rapport:  Unable to Assess (Patient asleep) Affect (typically observed):  Unable to Assess (Patient asleep) Orientation:  Oriented to Self, Oriented to Place Alcohol / Substance use:  Not Applicable Psych involvement (Current and /or in the community):  No (Comment)  Discharge Needs  Concerns to be addressed:  Care Coordination Readmission within the last 30 days:  No Current discharge risk:  None Barriers to Discharge:  No Barriers Identified   Antionette Poles, LCSW 06/26/2017, 11:13 AM

## 2017-06-26 NOTE — Progress Notes (Signed)
CSW contacted patient's ALF and inquired about patient's ability to return. Staff member Herbert Seta reported that they would review patient's referral and contact CSW with update.   CSW contacted by [employer] and notified that patient's ALF [facility] called and confirmed patient's ability to return.   CSW updated patient's RN.  CSW notified patient's daughter who reported that she was in route to pick up patient and transport to [facility].  CSW signing off, no other needs identified at this time.   Celso Sickle, [place] Clinical Social Worker [facility] Cell#: [phone]

## 2017-06-26 NOTE — Discharge Summary (Signed)
Physician Discharge Summary  Tami Harris MRN: [id number] DOB/AGE: Jun 02, 1924 83 y.o.  PCP: Angela Cox, MD   Admit date: 06/25/2017 Discharge date: 06/26/2017  Discharge Diagnoses:    Principal Problem:   Altered mental status Active Problems:   Dementia   Hypertension    Follow-up recommendations Follow-up with PCP in 3-5 days , including all  additional recommended appointments as below Follow-up CBC, CMP in 3-5 days Minimize sedating medications including tramadol PCP to consider antihypertensive regimen for better blood pressure control     Current Discharge Medication List    START taking these medications   Details  aspirin EC 81 MG tablet Take 1 tablet (81 mg total) by mouth daily. Qty: 150 tablet, Refills: 2          potassium chloride SA (K-DUR,KLOR-CON) 20 MEQ tablet Take 1 tablet (20 mEq total) by mouth daily. Qty: 30 tablet, Refills: 1      CONTINUE these medications which have NOT CHANGED   Details  acetaminophen (TYLENOL) 325 MG tablet Take 325 mg by mouth every 6 (six) hours as needed for mild pain.     bisacodyl (DULCOLAX) 10 MG suppository Place 1 suppository (10 mg total) rectally daily as needed for moderate constipation. Qty: 12 suppository, Refills: 0    docusate sodium (COLACE) 100 MG capsule Take 1 capsule (100 mg total) by mouth 2 (two) times daily. Qty: 10 capsule, Refills: 0    donepezil (ARICEPT) 10 MG tablet Take 10 mg by mouth at bedtime.    ferrous sulfate (FERROUSUL) 325 (65 FE) MG tablet Take 1 tablet (325 mg total) by mouth 2 (two) times daily with a meal. Qty: 60 tablet, Refills: 0    Multiple Vitamin (MULTIVITAMIN WITH MINERALS) TABS tablet Take 1 tablet by mouth daily.    omega-3 acid ethyl esters (LOVAZA) 1 g capsule Take 1 g by mouth 2 (two) times daily.    pantoprazole (PROTONIX) 40 MG tablet Take 1 tablet (40 mg total) by mouth 2 (two) times daily. Qty: 28 tablet, Refills: 0    Skin Protectants, Misc.  (EUCERIN) cream Apply 1 application topically 2 (two) times daily as needed for dry skin. Pt applies to both feet.    sucralfate (CARAFATE) 1 GM/10ML suspension Take 10 mLs (1 g total) by mouth 4 (four) times daily -  with meals and at bedtime. Qty: 420 mL, Refills: 0    Vitamin D, Ergocalciferol, (DRISDOL) 50000 units CAPS capsule Take 50,000 Units by mouth every 30 (thirty) days. Pt takes on the 26th every month.      STOP taking these medications     traMADol (ULTRAM) 50 MG tablet          Discharge Condition: *Overall prognosis guarded given age and dementia    Discharge Instructions Get Medicines reviewed and adjusted: Please take all your medications with you for your next visit with your Primary MD  Please request your Primary MD to go over all hospital tests and procedure/radiological results at the follow up, please ask your Primary MD to get all Hospital records sent to his/her office.  If you experience worsening of your admission symptoms, develop shortness of breath, life threatening emergency, suicidal or homicidal thoughts you must seek medical attention immediately by calling 911 or calling your MD immediately if symptoms less severe.  You must read complete instructions/literature along with all the possible adverse reactions/side effects for all the Medicines you take and that have been prescribed to you. Take any new  Medicines after you have completely understood and accpet all the possible adverse reactions/side effects.   Do not drive when taking Pain medications.   Do not take more than prescribed Pain, Sleep and Anxiety Medications  Special Instructions: If you have smoked or chewed Tobacco in the last 2 yrs please stop smoking, stop any regular Alcohol and or any Recreational drug use.  Wear Seat belts while driving.  Please note  You were cared for by a hospitalist during your hospital stay. Once you are discharged, your primary care physician will  handle any further medical issues. Please note that NO REFILLS for any discharge medications will be authorized once you are discharged, as it is imperative that you return to your primary care physician (or establish a relationship with a primary care physician if you do not have one) for your aftercare needs so that they can reassess your need for medications and monitor your lab values.     Allergies  Allergen Reactions  . Sulfa Antibiotics Other (See Comments)    Reaction:  Unknown   . Tape Other (See Comments)    Reaction:  Tears pts skin       Disposition: Long-term memory care unit   Consults:   None   Significant Diagnostic Studies:  Dg Chest 2 View  Result Date: 06/25/2017 CLINICAL DATA:  83 y/o  F; unwitnessed fall.  Dyspnea and crackles. EXAM: CHEST  2 VIEW COMPARISON:  03/27/2017 chest radiograph. FINDINGS: Stable normal cardiac silhouette given projection and technique. Large hiatal hernia. Aortic atherosclerosis with calcification. Diffuse bronchitic changes. No focal consolidation. Stable mild biapical pleuroparenchymal scarring. Azygos fissure noted. No pleural effusion or pneumothorax. No acute osseous abnormality is evident. IMPRESSION: Diffuse bronchitic changes. No focal consolidation. Large hiatal hernia. Aortic atherosclerosis. Electronically Signed   By: Kristine Garbe M.D.   On: 06/25/2017 16:37   Dg Pelvis 1-2 Views  Result Date: 06/25/2017 CLINICAL DATA:  Unwitnessed fall.  Tailbone pain. EXAM: PELVIS - 1-2 VIEW COMPARISON:  03/27/2017 CT abdomen/pelvis. FINDINGS: Partially visualized intramedullary rod in the proximal right femur with interlocking right femoral neck pin, with no evidence of hardware fracture or loosening. Healed deformity in the proximal right femur. No pelvic fracture or diastasis. Diffuse osteopenia. No evidence of hip malalignment on this single frontal view. No suspicious focal osseous lesions. Degenerative changes in the  visualized lower lumbar spine. IMPRESSION: No pelvic fracture. Electronically Signed   By: Ilona Sorrel M.D.   On: 06/25/2017 16:37   Dg Sacrum/coccyx  Result Date: 06/25/2017 CLINICAL DATA:  Unwitnessed fall.  Tailbone pain. EXAM: SACRUM AND COCCYX - 2+ VIEW COMPARISON:  03/27/2017 CT abdomen/pelvis. FINDINGS: Diffuse osteopenia. No fracture or suspicious focal osseous lesion. Moderate degenerative changes in the visualized lumbar spine. Partially visualized screw in the right femoral neck. IMPRESSION: No fracture. Electronically Signed   By: Ilona Sorrel M.D.   On: 06/25/2017 16:40   Ct Head Wo Contrast  Result Date: 06/25/2017 CLINICAL DATA:  83 year old female with history of dementia. Confused and combative. Nausea. EXAM: CT HEAD WITHOUT CONTRAST TECHNIQUE: Contiguous axial images were obtained from the base of the skull through the vertex without intravenous contrast. COMPARISON:  Head CT 06/08/2016. FINDINGS: Comment: Today's study is limited by considerable patient motion. Brain: Mild cerebral atrophy. Patchy and confluent areas of decreased attenuation are noted throughout the deep and periventricular white matter of the cerebral hemispheres bilaterally, compatible with chronic microvascular ischemic disease. No evidence of acute infarction, hemorrhage, hydrocephalus, extra-axial collection or mass  lesion/mass effect. Vascular: No hyperdense vessel or unexpected calcification. Skull: Normal. Negative for fracture or focal lesion. Sinuses/Orbits: No acute finding. Other: None. IMPRESSION: 1. No acute intracranial abnormalities. 2. Mild cerebral atrophy with chronic microvascular ischemic changes in cerebral white matter. Electronically Signed   By: Vinnie Langton M.D.   On: 06/25/2017 16:37        Filed Weights   06/25/17 2150 06/26/17 0529  Weight: 66.2 kg (145 lb 15.1 oz) 66.1 kg (145 lb 11.6 oz)     Microbiology: Recent Results (from the past 240 hour(s))  MRSA PCR Screening      Status: Abnormal   Collection Time: 06/25/17 11:52 PM  Result Value Ref Range Status   MRSA by PCR POSITIVE (A) NEGATIVE Final    Comment:        The GeneXpert MRSA Assay (FDA approved for NASAL specimens only), is one component of a comprehensive MRSA colonization surveillance program. It is not intended to diagnose MRSA infection nor to guide or monitor treatment for MRSA infections. RESULT CALLED TO, READ BACK BY AND VERIFIED WITH: S SEAGROVES,RN '@0241'$  06/26/17 MKELLY        Blood Culture    Component Value Date/Time   SDES URINE, CLEAN CATCH 03/27/2017 2342   SPECREQUEST NONE 03/27/2017 2342   CULT MULTIPLE ORGANISMS PRESENT, NONE PREDOMINANT (A) 03/27/2017 2342   REPTSTATUS 03/29/2017 FINAL 03/27/2017 2342      Labs: Results for orders placed or performed during the hospital encounter of 06/25/17 (from the past 48 hour(s))  Urinalysis, Routine w reflex microscopic     Status: Abnormal   Collection Time: 06/25/17 11:43 AM  Result Value Ref Range   Color, Urine AMBER (A) YELLOW    Comment: BIOCHEMICALS MAY BE AFFECTED BY COLOR   APPearance CLOUDY (A) CLEAR   Specific Gravity, Urine 1.020 1.005 - 1.030   pH 5.0 5.0 - 8.0   Glucose, UA NEGATIVE NEGATIVE mg/dL   Hgb urine dipstick SMALL (A) NEGATIVE   Bilirubin Urine NEGATIVE NEGATIVE   Ketones, ur NEGATIVE NEGATIVE mg/dL   Protein, ur 30 (A) NEGATIVE mg/dL   Nitrite NEGATIVE NEGATIVE   Leukocytes, UA TRACE (A) NEGATIVE   RBC / HPF 0-5 0 - 5 RBC/hpf   WBC, UA 0-5 0 - 5 WBC/hpf   Bacteria, UA MANY (A) NONE SEEN   Squamous Epithelial / LPF 0-5 (A) NONE SEEN   Mucus PRESENT   Troponin I     Status: None   Collection Time: 06/25/17  3:15 PM  Result Value Ref Range   Troponin I <0.03 <0.03 ng/mL  Brain natriuretic peptide     Status: None   Collection Time: 06/25/17  3:15 PM  Result Value Ref Range   B Natriuretic Peptide 91.5 0.0 - 100.0 pg/mL  CBG monitoring, ED     Status: Abnormal   Collection Time:  06/25/17  3:20 PM  Result Value Ref Range   Glucose-Capillary 103 (H) 65 - 99 mg/dL  Comprehensive metabolic panel     Status: Abnormal   Collection Time: 06/25/17  3:39 PM  Result Value Ref Range   Sodium 140 135 - 145 mmol/L   Potassium 3.5 3.5 - 5.1 mmol/L   Chloride 101 101 - 111 mmol/L   CO2 27 22 - 32 mmol/L   Glucose, Bld 101 (H) 65 - 99 mg/dL   BUN 11 6 - 20 mg/dL   Creatinine, Ser 0.77 0.44 - 1.00 mg/dL   Calcium 9.4 8.9 - 10.3 mg/dL  Total Protein 7.2 6.5 - 8.1 g/dL   Albumin 4.1 3.5 - 5.0 g/dL   AST 27 15 - 41 U/L   ALT 16 14 - 54 U/L   Alkaline Phosphatase 69 38 - 126 U/L   Total Bilirubin 0.7 0.3 - 1.2 mg/dL   GFR calc non Af Amer >60 >60 mL/min   GFR calc Af Amer >60 >60 mL/min    Comment: (NOTE) The eGFR has been calculated using the CKD EPI equation. This calculation has not been validated in all clinical situations. eGFR's persistently <60 mL/min signify possible Chronic Kidney Disease.    Anion gap 12 5 - 15  CBC     Status: None   Collection Time: 06/25/17  3:39 PM  Result Value Ref Range   WBC 9.2 4.0 - 10.5 K/uL   RBC 4.63 3.87 - 5.11 MIL/uL   Hemoglobin 14.9 12.0 - 15.0 g/dL   HCT 43.7 36.0 - 46.0 %   MCV 94.4 78.0 - 100.0 fL   MCH 32.2 26.0 - 34.0 pg   MCHC 34.1 30.0 - 36.0 g/dL   RDW 13.0 11.5 - 15.5 %   Platelets 198 150 - 400 K/uL  MRSA PCR Screening     Status: Abnormal   Collection Time: 06/25/17 11:52 PM  Result Value Ref Range   MRSA by PCR POSITIVE (A) NEGATIVE    Comment:        The GeneXpert MRSA Assay (FDA approved for NASAL specimens only), is one component of a comprehensive MRSA colonization surveillance program. It is not intended to diagnose MRSA infection nor to guide or monitor treatment for MRSA infections. RESULT CALLED TO, READ BACK BY AND VERIFIED WITH: S SEAGROVES,RN '@0241'$  06/26/17 MKELLY   Comprehensive metabolic panel     Status: Abnormal   Collection Time: 06/26/17  4:29 AM  Result Value Ref Range   Sodium  142 135 - 145 mmol/L   Potassium 3.1 (L) 3.5 - 5.1 mmol/L   Chloride 104 101 - 111 mmol/L   CO2 27 22 - 32 mmol/L   Glucose, Bld 96 65 - 99 mg/dL   BUN 8 6 - 20 mg/dL   Creatinine, Ser 0.70 0.44 - 1.00 mg/dL   Calcium 8.6 (L) 8.9 - 10.3 mg/dL   Total Protein 5.7 (L) 6.5 - 8.1 g/dL   Albumin 3.1 (L) 3.5 - 5.0 g/dL   AST 20 15 - 41 U/L   ALT 14 14 - 54 U/L   Alkaline Phosphatase 52 38 - 126 U/L   Total Bilirubin 0.8 0.3 - 1.2 mg/dL   GFR calc non Af Amer >60 >60 mL/min   GFR calc Af Amer >60 >60 mL/min    Comment: (NOTE) The eGFR has been calculated using the CKD EPI equation. This calculation has not been validated in all clinical situations. eGFR's persistently <60 mL/min signify possible Chronic Kidney Disease.    Anion gap 11 5 - 15  CBC     Status: None   Collection Time: 06/26/17  4:29 AM  Result Value Ref Range   WBC 7.1 4.0 - 10.5 K/uL   RBC 3.96 3.87 - 5.11 MIL/uL   Hemoglobin 12.5 12.0 - 15.0 g/dL   HCT 37.6 36.0 - 46.0 %   MCV 94.9 78.0 - 100.0 fL   MCH 31.6 26.0 - 34.0 pg   MCHC 33.2 30.0 - 36.0 g/dL   RDW 13.0 11.5 - 15.5 %   Platelets 171 150 - 400 K/uL  Lipid Panel  No results found for: CHOL, TRIG, HDL, CHOLHDL, VLDL, LDLCALC, LDLDIRECT   No results found for: HGBA1C   Lab Results  Component Value Date   CREATININE 0.70 06/26/2017     HPI :*   83 y.o. female, w dementia, apparently was doing fine when last nite the ALF staff called her 3 times to say that her mother is having significant behavioral disturbances, and was much more combative.  This history is all from ER note because of the patients significant dementia and fact that daughter is not present.  ER noted slight left facial droop, and requested admission.The daughter helped with history and states that she has been doing fine for the most part last night the assisted-living facility staff called her 3 times to say that her mother is having significant behavioral disturbances and was  being more combative and had personality changes that were very much unlike her.   Also found to have Hypertensive urgency. Admitted to rule out CVA  HOSPITAL COURSE:   Acute encephalopathy, and the setting of dementia No clear etiology, UA, chest x-ray negative Could be related to tramadol Unclear what her baseline is, but patient doesn't appear agitated at this time.  No left facial droop on exam MRI brain in am r/o CVA chronic ischemia/small vessel disease.No acute CVA  Aspirin for possible TIA, patient is already on twice a day Protonix for GI prophylaxis   Hypertensive urgency Patient is currently not on any outpatient blood pressure medications, may benefit from better blood pressure control Will defer to PCP after blood pressure is trended appropriately  Bacteriuria Pt received rocephin x1 in the ED. follow results of urine culture, doubt UTI, only trace amount of leukocytes, negative nitrite  Dementia Cont aricept  Hypokalemia replete   Discharge Exam  Blood pressure (!) 165/64, pulse 79, temperature 98.4 F (36.9 C), temperature source Oral, resp. rate 20, height '5\' 6"'$  (1.676 m), weight 66.1 kg (145 lb 11.6 oz), SpO2 95 %. Cardiovascular: Normal rate, regular rhythm. Good peripheral circulation. Grossly normal heart sounds.   Respiratory: Normal respiratory effort.  No retractions. Lungs with diffuse mild crackles. Gastrointestinal: Soft and nontender. No distention.  Musculoskeletal: No lower extremity tenderness nor edema. No gross deformities of extremities. Neurologic:  Normal speech and language. Left facial droop. Decreased but symmetric strength in all extremities. No ataxia.      Follow-up Information    PCP. Call.   Why:  Hospital follow-up in 3-5 days          Signed: Reyne Dumas 06/26/2017, 9:26 AM        Time spent >1 hour

## 2017-06-26 NOTE — Progress Notes (Signed)
Per CSW, pt is going to DC to [place] and needs HHPT. MD asked for orders and [facility] rep alerted of referral. Sandford Craze RN,BSN,NCM [phone]

## 2017-06-26 NOTE — Care Management Obs Status (Signed)
MEDICARE OBSERVATION STATUS NOTIFICATION   Patient Details  Name: Tami Harris MRN: [id number] Date of Birth: Aug 22, 1924   Medicare Observation Status Notification Given:  Other (see comment) (Pt is confused. Admitted with AMS. Daughter Dionne Milo called to explain MOON notice. )    Reeanna Acri, RN 06/26/2017, 9:30 AM

## 2017-06-26 NOTE — NC FL2 (Signed)
[place] MEDICAID FL2 LEVEL OF CARE SCREENING TOOL     IDENTIFICATION  Patient Name: Tami Harris Birthdate: Oct 24, 1923 Sex: female Admission Date (Current Location): 06/25/2017  [facility] and [place] Number:  Producer, television/film/video and Address:  [facility],  [address] [place] [address]      Provider Number: [id number]  Attending Physician Name and Address:  Richarda Overlie, MD  Relative Name and Phone Number:       Current Level of Care: Hospital Recommended Level of Care: Assisted Living Facility Prior Approval Number:    Date Approved/Denied:   PASRR Number: [id number]  Discharge Plan: Other (Comment) (Assisted Living Facility)    Current Diagnoses: Patient Active Problem List   Diagnosis Date Noted  . Altered mental status 06/25/2017  . Hypertension 06/25/2017  . GI bleed 03/28/2017  . Acute GI bleeding 03/27/2017  . Hiatal hernia with GERD and esophagitis 03/27/2017  . Leucocytosis 03/27/2017  . Constipation 03/27/2017  . Intractable nausea and vomiting 03/27/2017  . Palliative care encounter   . Closed right hip fracture (HCC) 06/16/2016  . Hip fracture (HCC) 06/16/2016  . Goals of care, counseling/discussion   . Advance care planning   . Palliative care by specialist   . Fall 04/18/2016  . Distal radius fracture 04/18/2016  . Syncope 04/18/2016  . PVD (peripheral vascular disease) (HCC)   . Critical lower limb ischemia 04/08/2016  . 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 Pneumonia 10/18/2011  . Dementia 10/18/2011    Orientation RESPIRATION BLADDER Height & Weight     Self  Normal Incontinent Weight: 145 lb 11.6 oz (66.1 kg) Height:   (167.6 cm)  BEHAVIORAL SYMPTOMS/MOOD NEUROLOGICAL BOWEL NUTRITION STATUS        Diet (regular)  AMBULATORY STATUS COMMUNICATION OF NEEDS Skin   Limited Assist Verbally Normal                       Personal Care Assistance Level of Assistance  Bathing, Feeding, Dressing Bathing  Assistance: Limited assistance Feeding assistance: Limited assistance Dressing Assistance: Limited assistance     Functional Limitations Info             SPECIAL CARE FACTORS FREQUENCY  PT (By licensed PT)     PT Frequency: HHPT              Contractures Contractures Info: Not present    Additional Factors Info  Code Status, Allergies Code Status Info: DNR Allergies Info: Sulfa Antibiotics, Tape           Current Medications (06/26/2017):  This is the current hospital active medication list Current Facility-Administered Medications  Medication Dose Route Frequency Provider Last Rate Last Dose  . acetaminophen (TYLENOL) tablet 325 mg  325 mg Oral Q6H PRN Pearson Grippe, MD      . bisacodyl (DULCOLAX) suppository 10 mg  10 mg Rectal Daily PRN Pearson Grippe, MD      . Chlorhexidine Gluconate Cloth 2 % PADS 6 each  6 each Topical Q0600 Pearson Grippe, MD   6 each at 06/26/17 [phone]  . docusate sodium (COLACE) capsule 100 mg  100 mg Oral BID Pearson Grippe, MD   100 mg at 06/26/17 0944  . donepezil (ARICEPT) tablet 10 mg  10 mg Oral Farrel Demark, MD   10 mg at 06/25/17 2213  . enoxaparin (LOVENOX) injection 40 mg  40 mg Subcutaneous QHS Pearson Grippe, MD   40 mg at 06/25/17 2214  .  ferrous sulfate tablet 325 mg  325 mg Oral BID WC Pearson Grippe, MD   325 mg at 06/26/17 0854  . hydrALAZINE (APRESOLINE) injection 5 mg  5 mg Intravenous Q6H PRN Pearson Grippe, MD   5 mg at 06/26/17 0537  . hydrocerin (EUCERIN) cream 1 application  1 application Topical BID PRN Pearson Grippe, MD      . multivitamin with minerals tablet 1 tablet  1 tablet Oral Daily Pearson Grippe, MD   1 tablet at 06/26/17 0944  . mupirocin ointment (BACTROBAN) 2 % 1 application  1 application Nasal BID Pearson Grippe, MD   1 application at 06/26/17 [phone]  . omega-3 acid ethyl esters (LOVAZA) capsule 1 g  1 g Oral BID Pearson Grippe, MD   1 g at 06/26/17 0944  . sucralfate (CARAFATE) 1 GM/10ML suspension 1 g  1 g Oral TID WC & HS Pearson Grippe, MD   1 g  at 06/26/17 1350  . [START ON 07/14/2017] Vitamin D (Ergocalciferol) (DRISDOL) capsule 50,000 Units  50,000 Units Oral Q30 days Pearson Grippe, MD         Discharge Medications: Please see discharge summary for a list of discharge medications. Current Discharge Medication List        START taking these medications   Details  aspirin EC 81 MG tablet Take 1 tablet (81 mg total) by mouth daily. Qty: 150 tablet, Refills: 2          potassium chloride SA (K-DUR,KLOR-CON) 20 MEQ tablet Take 1 tablet (20 mEq total) by mouth daily. Qty: 30 tablet, Refills: 1          CONTINUE these medications which have NOT CHANGED   Details  acetaminophen (TYLENOL) 325 MG tablet Take 325 mg by mouth every 6 (six) hours as needed for mild pain.     bisacodyl (DULCOLAX) 10 MG suppository Place 1 suppository (10 mg total) rectally daily as needed for moderate constipation. Qty: 12 suppository, Refills: 0    docusate sodium (COLACE) 100 MG capsule Take 1 capsule (100 mg total) by mouth 2 (two) times daily. Qty: 10 capsule, Refills: 0    donepezil (ARICEPT) 10 MG tablet Take 10 mg by mouth at bedtime.    ferrous sulfate (FERROUSUL) 325 (65 FE) MG tablet Take 1 tablet (325 mg total) by mouth 2 (two) times daily with a meal. Qty: 60 tablet, Refills: 0    Multiple Vitamin (MULTIVITAMIN WITH MINERALS) TABS tablet Take 1 tablet by mouth daily.    omega-3 acid ethyl esters (LOVAZA) 1 g capsule Take 1 g by mouth 2 (two) times daily.    pantoprazole (PROTONIX) 40 MG tablet Take 1 tablet (40 mg total) by mouth 2 (two) times daily. Qty: 28 tablet, Refills: 0    Skin Protectants, Misc. (EUCERIN) cream Apply 1 application topically 2 (two) times daily as needed for dry skin. Pt applies to both feet.    sucralfate (CARAFATE) 1 GM/10ML suspension Take 10 mLs (1 g total) by mouth 4 (four) times daily -  with meals and at bedtime. Qty: 420 mL, Refills: 0    Vitamin D, Ergocalciferol, (DRISDOL)  50000 units CAPS capsule Take 50,000 Units by mouth every 30 (thirty) days. Pt takes on the 26th every month.         STOP taking these medications     traMADol (ULTRAM) 50 MG tablet       Relevant Imaging Results:  Relevant Lab Results:   Additional Information SS#: [id number]. Pt needs  Home Health PT Isolation: Contact Precautions Infection: MRSA  Antionette Poles, LCSW

## 2017-06-26 NOTE — Progress Notes (Signed)
Report called to [facility].  Tami Harris, patient's daughter, here to transport to facility. Nino Parsley

## 2017-07-02 LAB — URINE CULTURE

## 2018-01-25 ENCOUNTER — Non-Acute Institutional Stay: Payer: Self-pay | Admitting: Hospice and Palliative Medicine

## 2018-01-25 DIAGNOSIS — Z515 Encounter for palliative care: Secondary | ICD-10-CM

## 2018-01-26 NOTE — Progress Notes (Signed)
PALLIATIVE CARE CONSULT VISIT   PATIENT NAME: Tami Harris DOB: Dec 29, 1923 MRN: [id number]  PRIMARY CARE PROVIDER: Angela Cox, MD  REFERRING PROVIDER: Dr. Kerry Dory  RESPONSIBLE PARTY:   Daughter   RECOMMENDATIONS and PLAN:  1. Generalized weakness: secondary to advanced age with worsening Alzheimer's Dementia approximately 6D on FAST scale. She has defaulted to the [facility] lately. It is more difficult for her to walk with her walker. She still transfers herself from the bed to the [facility] and takes herself to the bathroom, but forgets things like locking the wheels etc. She is able to speak in full sentences. She is not aware of the time. She still feeds herself. No known weight loss. She is staying in her room more often and not socializing much anymore. I would recommend [facility]HC PT again and possibly ST again for cognitive and physical strengthening and stimulation. She has a flat affect. Encourage resident to go to exercise class. 2. ACP: DNR status. Has an old MOST form on file. Have left a message for daughter. Will schedule a day to meet to discuss goals of care again. At the time of the MOST form, Patient did not desire hospitalization, but has been there for surgery and GI bleed. Will clarify goals.  I spent 20 minutes providing this consultation,  from 10:00am to 10:20 am. More than 50% of the time in this consultation was spent assessing and interviewing patient and staff and coordinating communication.   HISTORY OF PRESENT ILLNESS:  Tami Harris is a 84 y.o. female with multiple medical problems who resides in the AL setting. . Palliative Care was asked to help with symptom management and goals of care.  CODE STATUS: DNR  PPS: weak 40% HOSPICE ELIGIBILITY/DIAGNOSIS: TBD  PAST MEDICAL HISTORY:  Past Medical History:  Diagnosis Date  . 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 Allergic rhinitis   . Alzheimer's dementia  dx'd 2008  . Diverticulosis   . Fall   . Family history of adverse reaction to anesthesia    daughter reports "either faulty tube crimped or swelling from allergic reaction caused the tube to crimp"  . GERD (gastroesophageal reflux disease)   . Headache    "fragrant related"  . Hiatal hernia   . Memory loss   . Pneumonia 2013  . Radial fracture   . Seasonal allergies     SOCIAL HX:  Social History   Tobacco Use  . Smoking status: Never Smoker  . Smokeless tobacco: Never Used  Substance Use Topics  . Alcohol use: No    Alcohol/week: 0.0 oz    ALLERGIES:  Allergies  Allergen Reactions  . Sulfa Antibiotics Other (See Comments)    Reaction:  Unknown   . Tape Other (See Comments)    Reaction:  Tears pts skin      PERTINENT MEDICATIONS:  Outpatient Encounter Medications as of 01/25/2018  Medication Sig  . acetaminophen (TYLENOL) 325 MG tablet Take 325 mg by mouth every 6 (six) hours as needed for mild pain.   Marland Kitchen aspirin EC 81 MG tablet Take 1 tablet (81 mg total) by mouth daily.  . bisacodyl (DULCOLAX) 10 MG suppository Place 1 suppository (10 mg total) rectally daily as needed for moderate constipation.  . docusate sodium (COLACE) 100 MG capsule Take 1 capsule (100 mg total) by mouth 2 (two) times daily.  Marland Kitchen donepezil (ARICEPT) 10 MG tablet Take 10 mg by mouth  at bedtime.  . ferrous sulfate (FERROUSUL) 325 (65 FE) MG tablet Take 1 tablet (325 mg total) by mouth 2 (two) times daily with a meal.  . Multiple Vitamin (MULTIVITAMIN WITH MINERALS) TABS tablet Take 1 tablet by mouth daily.  Marland Kitchen omega-3 acid ethyl esters (LOVAZA) 1 g capsule Take 1 g by mouth 2 (two) times daily.  . potassium chloride SA (K-DUR,KLOR-CON) 20 MEQ tablet Take 1 tablet (20 mEq total) by mouth daily.  . Skin Protectants, Misc. (EUCERIN) cream Apply 1 application topically 2 (two) times daily as needed for dry skin. Pt applies to both feet.  . sucralfate (CARAFATE) 1 GM/10ML suspension Take 10 mLs (1 g total) by  mouth 4 (four) times daily -  with meals and at bedtime.  . Vitamin D, Ergocalciferol, (DRISDOL) 50000 units CAPS capsule Take 50,000 Units by mouth every 30 (thirty) days. Pt takes on the 26th every month.   No facility-administered encounter medications on file as of 01/25/2018.     PHYSICAL EXAM:   General: Elderly Caucasian female sleeping in NAD Cardiovascular: RRR Pulmonary: CTAB Abdomen: soft, active BS, NTTP Extremities: No edema; weak on standing Skin: intact; no open areas on exposed skin Neurological: easily arouses. Simple answers only. +memory loss  Truett Perna, NP

## 2018-04-23 ENCOUNTER — Inpatient Hospital Stay (HOSPITAL_COMMUNITY)
Admission: EM | Admit: 2018-04-23 | Discharge: 2018-04-26 | DRG: 194 | Disposition: A | Discharge status: Hospice | Payer: Medicare Other | Attending: Internal Medicine | Admitting: Internal Medicine

## 2018-04-23 ENCOUNTER — Emergency Department (HOSPITAL_COMMUNITY): Payer: Medicare Other

## 2018-04-23 ENCOUNTER — Other Ambulatory Visit: Payer: Self-pay

## 2018-04-23 ENCOUNTER — Encounter (HOSPITAL_COMMUNITY): Payer: Self-pay

## 2018-04-23 DIAGNOSIS — R1310 Dysphagia, unspecified: Secondary | ICD-10-CM | POA: Diagnosis not present

## 2018-04-23 DIAGNOSIS — N3090 Cystitis, unspecified without hematuria: Secondary | ICD-10-CM

## 2018-04-23 DIAGNOSIS — Z91048 Other nonmedicinal substance allergy status: Secondary | ICD-10-CM

## 2018-04-23 DIAGNOSIS — N390 Urinary tract infection, site not specified: Secondary | ICD-10-CM | POA: Diagnosis present

## 2018-04-23 DIAGNOSIS — R0902 Hypoxemia: Secondary | ICD-10-CM

## 2018-04-23 DIAGNOSIS — R319 Hematuria, unspecified: Secondary | ICD-10-CM | POA: Diagnosis present

## 2018-04-23 DIAGNOSIS — B952 Enterococcus as the cause of diseases classified elsewhere: Secondary | ICD-10-CM | POA: Diagnosis present

## 2018-04-23 DIAGNOSIS — I739 Peripheral vascular disease, unspecified: Secondary | ICD-10-CM | POA: Diagnosis present

## 2018-04-23 DIAGNOSIS — Z515 Encounter for palliative care: Secondary | ICD-10-CM | POA: Diagnosis present

## 2018-04-23 DIAGNOSIS — J189 Pneumonia, unspecified organism: Secondary | ICD-10-CM | POA: Diagnosis present

## 2018-04-23 DIAGNOSIS — I10 Essential (primary) hypertension: Secondary | ICD-10-CM | POA: Diagnosis present

## 2018-04-23 DIAGNOSIS — Z66 Do not resuscitate: Secondary | ICD-10-CM | POA: Diagnosis present

## 2018-04-23 DIAGNOSIS — Z7982 Long term (current) use of aspirin: Secondary | ICD-10-CM | POA: Diagnosis not present

## 2018-04-23 DIAGNOSIS — R4702 Dysphasia: Secondary | ICD-10-CM | POA: Diagnosis present

## 2018-04-23 DIAGNOSIS — Z882 Allergy status to sulfonamides status: Secondary | ICD-10-CM

## 2018-04-23 DIAGNOSIS — H5440 Blindness, one eye, unspecified eye: Secondary | ICD-10-CM | POA: Diagnosis present

## 2018-04-23 DIAGNOSIS — R05 Cough: Secondary | ICD-10-CM | POA: Diagnosis present

## 2018-04-23 DIAGNOSIS — R1311 Dysphagia, oral phase: Secondary | ICD-10-CM | POA: Diagnosis present

## 2018-04-23 DIAGNOSIS — Z79899 Other long term (current) drug therapy: Secondary | ICD-10-CM

## 2018-04-23 DIAGNOSIS — K219 Gastro-esophageal reflux disease without esophagitis: Secondary | ICD-10-CM | POA: Diagnosis present

## 2018-04-23 DIAGNOSIS — Z634 Disappearance and death of family member: Secondary | ICD-10-CM | POA: Diagnosis not present

## 2018-04-23 DIAGNOSIS — F0280 Dementia in other diseases classified elsewhere without behavioral disturbance: Secondary | ICD-10-CM | POA: Diagnosis present

## 2018-04-23 DIAGNOSIS — G309 Alzheimer's disease, unspecified: Secondary | ICD-10-CM | POA: Diagnosis present

## 2018-04-23 DIAGNOSIS — F0390 Unspecified dementia without behavioral disturbance: Secondary | ICD-10-CM | POA: Diagnosis present

## 2018-04-23 DIAGNOSIS — Z7189 Other specified counseling: Secondary | ICD-10-CM | POA: Diagnosis not present

## 2018-04-23 DIAGNOSIS — R059 Cough, unspecified: Secondary | ICD-10-CM

## 2018-04-23 LAB — COMPREHENSIVE METABOLIC PANEL
ALBUMIN: 2.8 g/dL — AB (ref 3.5–5.0)
ALT: 19 U/L (ref 0–44)
AST: 24 U/L (ref 15–41)
Alkaline Phosphatase: 81 U/L (ref 38–126)
Anion gap: 13 (ref 5–15)
BUN: 12 mg/dL (ref 8–23)
CHLORIDE: 107 mmol/L (ref 98–111)
CO2: 24 mmol/L (ref 22–32)
CREATININE: 0.77 mg/dL (ref 0.44–1.00)
Calcium: 8.8 mg/dL — ABNORMAL LOW (ref 8.9–10.3)
GFR calc Af Amer: >60 mL/min (ref >60)
Glucose, Bld: 104 mg/dL — ABNORMAL HIGH (ref 70–99)
Potassium: 3.7 mmol/L (ref 3.5–5.1)
SODIUM: 144 mmol/L (ref 135–145)
Total Bilirubin: 0.7 mg/dL (ref 0.3–1.2)
Total Protein: 6.5 g/dL (ref 6.5–8.1)

## 2018-04-23 LAB — URINALYSIS, ROUTINE W REFLEX MICROSCOPIC
Bilirubin Urine: NEGATIVE
Glucose, UA: NEGATIVE mg/dL
Hgb urine dipstick: NEGATIVE
Ketones, ur: NEGATIVE mg/dL
Nitrite: NEGATIVE
PROTEIN: NEGATIVE mg/dL
Specific Gravity, Urine: 1.026 (ref 1.005–1.030)
pH: 5 (ref 5.0–8.0)

## 2018-04-23 LAB — CBC WITH DIFFERENTIAL/PLATELET
BASOS PCT: 1 %
Basophils Absolute: 0.1 10*3/uL (ref 0.0–0.1)
EOS PCT: 1 %
Eosinophils Absolute: 0.1 10*3/uL (ref 0.0–0.7)
HEMATOCRIT: 41.3 % (ref 36.0–46.0)
Hemoglobin: 13.9 g/dL (ref 12.0–15.0)
LYMPHS ABS: 1.8 10*3/uL (ref 0.7–4.0)
Lymphocytes Relative: 12 %
MCH: 31.5 pg (ref 26.0–34.0)
MCHC: 33.7 g/dL (ref 30.0–36.0)
MCV: 93.7 fL (ref 78.0–100.0)
MONO ABS: 1.2 10*3/uL — AB (ref 0.1–1.0)
MONOS PCT: 8 %
Neutro Abs: 11.6 10*3/uL — ABNORMAL HIGH (ref 1.7–7.7)
Neutrophils Relative %: 78 %
PLATELETS: 353 10*3/uL (ref 150–400)
RBC: 4.41 MIL/uL (ref 3.87–5.11)
RDW: 13.1 % (ref 11.5–15.5)
WBC: 14.8 10*3/uL — ABNORMAL HIGH (ref 4.0–10.5)

## 2018-04-23 MED ORDER — BISACODYL 10 MG RE SUPP: 10.0000 mg | Freq: Every day | RECTAL | Status: DC | PRN

## 2018-04-23 MED ORDER — AZITHROMYCIN 250 MG PO TABS
500.0000 mg | ORAL_TABLET | ORAL | Status: AC
  Administered 2018-04-24: 500 mg via ORAL
  Filled 2018-04-23: qty 2

## 2018-04-23 MED ORDER — ADULT MULTIVITAMIN W/MINERALS CH
1.0000 | ORAL_TABLET | Freq: Every day | ORAL | Status: DC
  Administered 2018-04-24: 1 via ORAL
  Filled 2018-04-23 (×2): qty 1

## 2018-04-23 MED ORDER — AZITHROMYCIN 500 MG IV SOLR
500.0000 mg | Freq: Once | INTRAVENOUS | Status: AC
  Administered 2018-04-23: 500 mg via INTRAVENOUS
  Filled 2018-04-23: qty 500

## 2018-04-23 MED ORDER — SODIUM CHLORIDE 0.9 % IV BOLUS
1000.0000 mL | Freq: Once | INTRAVENOUS | Status: AC
  Administered 2018-04-23: 1000 mL via INTRAVENOUS

## 2018-04-23 MED ORDER — POTASSIUM CHLORIDE CRYS ER 20 MEQ PO TBCR
20.0000 meq | EXTENDED_RELEASE_TABLET | Freq: Every day | ORAL | Status: DC
  Administered 2018-04-24: 20 meq via ORAL
  Filled 2018-04-23 (×2): qty 1

## 2018-04-23 MED ORDER — ASPIRIN EC 81 MG PO TBEC
81.0000 mg | DELAYED_RELEASE_TABLET | Freq: Every day | ORAL | Status: DC
  Administered 2018-04-24 – 2018-04-25 (×2): 81 mg via ORAL
  Filled 2018-04-23 (×2): qty 1

## 2018-04-23 MED ORDER — IPRATROPIUM-ALBUTEROL 0.5-2.5 (3) MG/3ML IN SOLN
3.0000 mL | Freq: Once | RESPIRATORY_TRACT | Status: AC
  Administered 2018-04-23: 3 mL via RESPIRATORY_TRACT
  Filled 2018-04-23: qty 3

## 2018-04-23 MED ORDER — DOCUSATE SODIUM 100 MG PO CAPS
100.0000 mg | ORAL_CAPSULE | Freq: Two times a day (BID) | ORAL | Status: DC
  Administered 2018-04-24 – 2018-04-26 (×6): 100 mg via ORAL
  Filled 2018-04-23 (×6): qty 1

## 2018-04-23 MED ORDER — FERROUS SULFATE 325 (65 FE) MG PO TABS
325.0000 mg | ORAL_TABLET | Freq: Two times a day (BID) | ORAL | Status: DC
  Administered 2018-04-24 – 2018-04-25 (×3): 325 mg via ORAL
  Filled 2018-04-23 (×3): qty 1

## 2018-04-23 MED ORDER — ENOXAPARIN SODIUM 40 MG/0.4ML ~~LOC~~ SOLN
40.0000 mg | Freq: Every day | SUBCUTANEOUS | Status: DC
  Administered 2018-04-24: 40 mg via SUBCUTANEOUS
  Filled 2018-04-23: qty 0.4

## 2018-04-23 MED ORDER — SODIUM CHLORIDE 0.9 % IV SOLN
1.0000 g | Freq: Once | INTRAVENOUS | Status: AC
  Administered 2018-04-23: 1 g via INTRAVENOUS
  Filled 2018-04-23: qty 10

## 2018-04-23 MED ORDER — LISINOPRIL 10 MG PO TABS
10.0000 mg | ORAL_TABLET | Freq: Every day | ORAL | Status: DC
  Administered 2018-04-24 – 2018-04-25 (×2): 10 mg via ORAL
  Filled 2018-04-23 (×2): qty 1

## 2018-04-23 MED ORDER — OMEGA-3-ACID ETHYL ESTERS 1 G PO CAPS
1.0000 g | ORAL_CAPSULE | Freq: Two times a day (BID) | ORAL | Status: DC
  Administered 2018-04-24 (×3): 1 g via ORAL
  Filled 2018-04-23 (×4): qty 1

## 2018-04-23 MED ORDER — SODIUM CHLORIDE 0.9 % IV SOLN
1.0000 g | INTRAVENOUS | Status: DC
  Administered 2018-04-24: 1 g via INTRAVENOUS
  Filled 2018-04-23: qty 1
  Filled 2018-04-23: qty 10

## 2018-04-23 MED ORDER — SUCRALFATE 1 G PO TABS
1.0000 g | ORAL_TABLET | Freq: Three times a day (TID) | ORAL | Status: DC
  Administered 2018-04-24 – 2018-04-26 (×8): 1 g via ORAL
  Filled 2018-04-23 (×9): qty 1

## 2018-04-23 MED ORDER — DONEPEZIL HCL 5 MG PO TABS
10.0000 mg | ORAL_TABLET | Freq: Every day | ORAL | Status: DC
  Administered 2018-04-24 – 2018-04-25 (×3): 10 mg via ORAL
  Filled 2018-04-23 (×3): qty 2

## 2018-04-23 MED ORDER — PANTOPRAZOLE SODIUM 40 MG PO TBEC
40.0000 mg | DELAYED_RELEASE_TABLET | Freq: Two times a day (BID) | ORAL | Status: DC
  Administered 2018-04-24 – 2018-04-26 (×6): 40 mg via ORAL
  Filled 2018-04-23 (×6): qty 1

## 2018-04-23 NOTE — ED Notes (Signed)
ED TO INPATIENT HANDOFF REPORT  Name/Age/Gender Tami Harris 84 y.o. female  Code Status    Code Status Orders  (From admission, onward)        Start     Ordered   04/23/18 1949  Do not attempt resuscitation (DNR)  Continuous    Question Answer Comment  In the event of cardiac or respiratory ARREST Do not call a "code blue"   In the event of cardiac or respiratory ARREST Do not perform Intubation, CPR, defibrillation or ACLS   In the event of cardiac or respiratory ARREST Use medication by any route, position, wound care, and other measures to relive pain and suffering. May use oxygen, suction and manual treatment of airway obstruction as needed for comfort.      04/23/18 1950    Code Status History    Date Active Date Inactive Code Status Order ID Comments User Context   06/25/2017 2142 06/26/2017 2016 DNR [id number]  Jani Gravel, MD Inpatient   06/25/2017 2015 06/25/2017 2142 DNR [id number]  Merrily Pew, MD ED   03/27/2017 0843 03/29/2017 1759 DNR [id number]  Lavina Hamman, MD Inpatient   06/16/2016 0527 06/22/2016 2308 DNR [id number]  Rise Patience, MD ED   05/16/2016 1227 05/16/2016 2004 Full Code [id number]  Lorretta Harp, MD Inpatient   04/18/2016 1050 04/20/2016 1832 DNR [id number]  Radene Gunning, NP ED   04/18/2016 1049 04/18/2016 1050 Full Code [id number]  Radene Gunning, NP ED   10/18/2011 1752 10/19/2011 2023 DNR [id number]  Samuella Cota, MD Inpatient    Advance Directive Documentation     Most Recent Value  Type of Advance Directive  Healthcare Power of Attorney, Living will  Pre-existing out of facility DNR order (yellow form or pink MOST form)  -  "MOST" Form in Place?  -      Home/SNF/Other Skilled nursing facility  Chief Complaint Cough  Level of Care/Admitting Diagnosis ED Disposition    ED Disposition Condition [place]: [facility] [[id number]]  Level of Care: Med-Surg [16]  Diagnosis: CAP (community acquired  pneumonia) [518841]  Admitting Physician: Etta Quill [phone]  Attending Physician: Etta Quill [phone]  Estimated length of stay: past midnight tomorrow  Certification:: I certify this patient will need inpatient services for at least 2 midnights  PT Class (Do Not Modify): Inpatient [101]  PT Acc Code (Do Not Modify): Private [1]       Medical History Past Medical History:  Diagnosis Date  . Age-related macular degeneration, wet, left eye (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 Allergic rhinitis   . Alzheimer's dementia dx'd 2008  . Diverticulosis   . Fall   . Family history of adverse reaction to anesthesia    daughter reports "either faulty tube crimped or swelling from allergic reaction caused the tube to crimp"  . GERD (gastroesophageal reflux disease)   . Headache    "fragrant related"  . Hiatal hernia   . Memory loss   . Pneumonia 2013  . Radial fracture   . Seasonal allergies     Allergies Allergies  Allergen Reactions  . Sulfa Antibiotics Other (See Comments)    Reaction:  Unknown   . Tape Other (See Comments)    Reaction:  Tears pts skin     IV Location/Drains/Wounds Patient Lines/Drains/Airways Status   Active Line/Drains/Airways    Name:   Placement date:   Placement time:  Site:   Days:   Peripheral IV 04/23/18 Left Hand   04/23/18    1700    Hand   less than 1          Labs/Imaging Results for orders placed or performed during the hospital encounter of 04/23/18 (from the past 48 hour(s))  Comprehensive metabolic panel     Status: Abnormal   Collection Time: 04/23/18  4:01 PM  Result Value Ref Range   Sodium 144 135 - 145 mmol/L   Potassium 3.7 3.5 - 5.1 mmol/L   Chloride 107 98 - 111 mmol/L   CO2 24 22 - 32 mmol/L   Glucose, Bld 104 (H) 70 - 99 mg/dL   BUN 12 8 - 23 mg/dL   Creatinine, Ser 0.77 0.44 - 1.00 mg/dL   Calcium 8.8 (L) 8.9 - 10.3 mg/dL   Total Protein 6.5 6.5 - 8.1 g/dL   Albumin 2.8 (L) 3.5 - 5.0 g/dL   AST 24 15 -  41 U/L   ALT 19 0 - 44 U/L   Alkaline Phosphatase 81 38 - 126 U/L   Total Bilirubin 0.7 0.3 - 1.2 mg/dL   GFR calc non Af Amer >60 >60 mL/min   GFR calc Af Amer >60 >60 mL/min    Comment: (NOTE) The eGFR has been calculated using the CKD EPI equation. This calculation has not been validated in all clinical situations. eGFR's persistently <60 mL/min signify possible Chronic Kidney Disease.    Anion gap 13 5 - 15    Comment: Performed at [facility], [address] [place], [place] [address]  CBC with Differential     Status: Abnormal   Collection Time: 04/23/18  4:01 PM  Result Value Ref Range   WBC 14.8 (H) 4.0 - 10.5 K/uL   RBC 4.41 3.87 - 5.11 MIL/uL   Hemoglobin 13.9 12.0 - 15.0 g/dL   HCT 41.3 36.0 - 46.0 %   MCV 93.7 78.0 - 100.0 fL   MCH 31.5 26.0 - 34.0 pg   MCHC 33.7 30.0 - 36.0 g/dL   RDW 13.1 11.5 - 15.5 %   Platelets 353 150 - 400 K/uL   Neutrophils Relative % 78 %   Lymphocytes Relative 12 %   Monocytes Relative 8 %   Eosinophils Relative 1 %   Basophils Relative 1 %   Neutro Abs 11.6 (H) 1.7 - 7.7 K/uL   Lymphs Abs 1.8 0.7 - 4.0 K/uL   Monocytes Absolute 1.2 (H) 0.1 - 1.0 K/uL   Eosinophils Absolute 0.1 0.0 - 0.7 K/uL   Basophils Absolute 0.1 0.0 - 0.1 K/uL   WBC Morphology VACUOLATED NEUTROPHILS     Comment: TOXIC GRANULATION Performed at [facility] [address] [place],  [address]   Urinalysis, Routine w reflex microscopic     Status: Abnormal   Collection Time: 04/23/18  6:23 PM  Result Value Ref Range   Color, Urine AMBER (A) YELLOW    Comment: BIOCHEMICALS MAY BE AFFECTED BY COLOR   APPearance HAZY (A) CLEAR   Specific Gravity, Urine 1.026 1.005 - 1.030   pH 5.0 5.0 - 8.0   Glucose, UA NEGATIVE NEGATIVE mg/dL   Hgb urine dipstick NEGATIVE NEGATIVE   Bilirubin Urine NEGATIVE NEGATIVE   Ketones, ur NEGATIVE NEGATIVE mg/dL   Protein, ur NEGATIVE NEGATIVE mg/dL   Nitrite NEGATIVE NEGATIVE    Leukocytes, UA MODERATE (A) NEGATIVE   RBC / HPF 0-5 0 - 5 RBC/hpf  WBC, UA 11-20 0 - 5 WBC/hpf   Bacteria, UA MANY (A) NONE SEEN   Squamous Epithelial / LPF 6-10 0 - 5   Mucus PRESENT     Comment: Performed at [facility], [place] [address], [place], [place] [address]   Dg Chest 2 View  Result Date: 04/23/2018 CLINICAL DATA:  Cough for 3 days. EXAM: CHEST - 2 VIEW COMPARISON:  06/25/2017 and prior radiographs FINDINGS: This is a low volume film. Cardiomegaly and mild bibasilar atelectasis identified. Moderate to large hiatal hernia again noted. No definite airspace disease noted. No pleural effusion, pneumothorax or acute bony abnormality. IMPRESSION: Cardiomegaly with mild bibasilar atelectasis in this low volume film. Moderate to large hiatal hernia. Electronically Signed   By: Margarette Canada M.D.   On: 04/23/2018 16:45    Pending Labs Unresulted Labs (From admission, onward)   Start     Ordered   04/23/18 1948  Culture, blood (routine x 2) Call MD if unable to obtain prior to antibiotics being given  BLOOD CULTURE X 2,   R    Comments:  If blood cultures drawn in Emergency Department - Do not draw and cancel order    04/23/18 1950   04/23/18 1948  Culture, sputum-assessment  Once,   R     04/23/18 1950   04/23/18 1948  Gram stain  Once,   R     04/23/18 1950   04/23/18 1948  HIV antibody (Routine Screening)  Once,   R     04/23/18 1950   04/23/18 1948  Strep pneumoniae urinary antigen  Once,   R     04/23/18 1950   04/23/18 1445  Urine culture  STAT,   STAT     04/23/18 1444      Vitals/Pain Today's Vitals   04/23/18 1730 04/23/18 1830 04/23/18 1900 04/23/18 1937  BP: (!) 138/58  133/61   Pulse: 64 82 75   Resp: (!) 29 (!) 21 (!) 23   Temp:    98.2 F (36.8 C)  TempSrc:    Oral  SpO2: 98% 97% 99%   PainSc:        Isolation Precautions No active isolations  Medications Medications  aspirin EC tablet 81 mg (has no administration in time range)   bisacodyl (DULCOLAX) suppository 10 mg (has no administration in time range)  docusate sodium (COLACE) capsule 100 mg (has no administration in time range)  donepezil (ARICEPT) tablet 10 mg (has no administration in time range)  lisinopril (PRINIVIL,ZESTRIL) tablet 10 mg (has no administration in time range)  ferrous sulfate tablet 325 mg (has no administration in time range)  sucralfate (CARAFATE) tablet 1 g (has no administration in time range)  potassium chloride SA (K-DUR,KLOR-CON) CR tablet 20 mEq (has no administration in time range)  multivitamin with minerals tablet 1 tablet (has no administration in time range)  omega-3 acid ethyl esters (LOVAZA) capsule 1 g (has no administration in time range)  pantoprazole (PROTONIX) EC tablet 40 mg (has no administration in time range)  cefTRIAXone (ROCEPHIN) 1 g in sodium chloride 0.9 % 100 mL IVPB (has no administration in time range)  azithromycin (ZITHROMAX) tablet 500 mg (has no administration in time range)  enoxaparin (LOVENOX) injection 40 mg (has no administration in time range)  cefTRIAXone (ROCEPHIN) 1 g in sodium chloride 0.9 % 100 mL IVPB (0 g Intravenous Stopped 04/23/18 1840)  azithromycin (ZITHROMAX) 500 mg in sodium chloride 0.9 % 250 mL IVPB (0 mg  Intravenous Stopped 04/23/18 1936)  sodium chloride 0.9 % bolus 1,000 mL (0 mLs Intravenous Stopped 04/23/18 1936)  ipratropium-albuterol (DUONEB) 0.5-2.5 (3) MG/3ML nebulizer solution 3 mL (3 mLs Nebulization Given 04/23/18 1753)    Mobility walks with device

## 2018-04-23 NOTE — ED Provider Notes (Signed)
[facility]-EMERGENCY DEPT Provider Note   CSN: [id number] Arrival date & time: 04/23/18  1242     History   Chief Complaint Chief Complaint  Patient presents with  . Cough  . Hematuria    HPI Tami Harris is a 84 y.o. female.  HPI   Level V caveat due to dementia.  Tami Harris is a 84 y.o. female, with a history of Alzheimer's, GERD, and HTN, presenting to the ED with cough for the last 3 days. Decreased activity and fatigue.   Staff at the long-term care facility noted hematuria in the patient's brief today. Patient is accompanied by her daughter at the bedside who is also her POA.  States patient is minimally verbal at baseline due to her dementia. Patient's daughter adds that the patient has seemed less interested in life over the past 6 to 8 months.  She frequently talks about her recently deceased sisters and wanting to die.    Past Medical History:  Diagnosis Date  . 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 Allergic rhinitis   . Alzheimer's dementia dx'd 2008  . Diverticulosis   . Fall   . Family history of adverse reaction to anesthesia    daughter reports "either faulty tube crimped or swelling from allergic reaction caused the tube to crimp"  . GERD (gastroesophageal reflux disease)   . Headache    "fragrant related"  . Hiatal hernia   . Memory loss   . Pneumonia 2013  . Radial fracture   . Seasonal allergies     Patient Active Problem List   Diagnosis Date Noted  . Altered mental status 06/25/2017  . Hypertension 06/25/2017  . GI bleed 03/28/2017  . Acute GI bleeding 03/27/2017  . Hiatal hernia with GERD and esophagitis 03/27/2017  . Leucocytosis 03/27/2017  . Constipation 03/27/2017  . Intractable nausea and vomiting 03/27/2017  . Palliative care encounter   . Closed right hip fracture (HCC) 06/16/2016  . Hip fracture (HCC) 06/16/2016  . Goals of care,  counseling/discussion   . Advance care planning   . Palliative care by specialist   . Fall 04/18/2016  . Distal radius fracture 04/18/2016  . Syncope 04/18/2016  . PVD (peripheral vascular disease) (HCC)   . Critical lower limb ischemia 04/08/2016  . 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 Pneumonia 10/18/2011  . Dementia 10/18/2011    Past Surgical History:  Procedure Laterality Date  . BLADDER SUSPENSION  X 2  . CATARACT EXTRACTION W/ INTRAOCULAR LENS IMPLANT Right   . DILATION AND CURETTAGE OF UTERUS    . FEMUR IM NAIL Right 06/17/2016   Procedure: INTRAMEDULLARY (IM) NAIL FEMORAL;  Surgeon: Sheral Apleyimothy D Murphy, MD;  Location: [facility] OR;  Service: Orthopedics;  Laterality: Right;  . INGUINAL HERNIA REPAIR Right   . PERIPHERAL VASCULAR CATHETERIZATION N/A 05/16/2016   Procedure: Lower Extremity Angiography;  Surgeon: Runell GessJonathan J Berry, MD;  [facility];  Service: Cardiovascular;  Laterality: N/A;     OB History   None      Home Medications    Prior to Admission medications   Medication Sig Start Date End Date Taking? Authorizing Provider  aspirin EC 81 MG tablet Take 1 tablet (81 mg total) by mouth daily. 06/26/17 06/26/18 Yes Richarda OverlieAbrol, Nayana, MD  docusate sodium (COLACE) 100 MG capsule Take 1 capsule (100 mg total) by mouth 2 (two) times daily. 06/21/16  Yes Abrol,  Germain Osgood, MD  donepezil (ARICEPT) 10 MG tablet Take 10 mg by mouth at bedtime.   Yes [provider]  ferrous sulfate (FERROUSUL) 325 (65 FE) MG tablet Take 1 tablet (325 mg total) by mouth 2 (two) times daily with a meal. 06/21/16  Yes Richarda Overlie, MD  lisinopril (PRINIVIL,ZESTRIL) 10 MG tablet Take 10 mg by mouth daily. 03/27/18  Yes [provider]  loperamide (IMODIUM) 2 MG capsule Take 2 mg by mouth daily as needed for diarrhea or loose stools.   Yes [provider]  Multiple Vitamin (MULTIVITAMIN WITH MINERALS) TABS tablet Take 1 tablet by mouth daily.   Yes [provider]   omega-3 acid ethyl esters (LOVAZA) 1 g capsule Take 1 g by mouth 2 (two) times daily.   Yes [provider]  pantoprazole (PROTONIX) 40 MG tablet Take 1 tablet (40 mg total) by mouth 2 (two) times daily. 03/29/17 04/23/18 Yes Arrien, York Ram, MD  potassium chloride SA (K-DUR,KLOR-CON) 20 MEQ tablet Take 1 tablet (20 mEq total) by mouth daily. 06/26/17  Yes Richarda Overlie, MD  Skin Protectants, Misc. (EUCERIN) cream Apply 1 application topically 2 (two) times daily as needed for dry skin. Pt applies to both feet.   Yes [provider]  sucralfate (CARAFATE) 1 g tablet Take 1 g by mouth 4 (four) times daily. 04/20/18  Yes [provider]  Vitamin D, Ergocalciferol, (DRISDOL) 50000 units CAPS capsule Take 50,000 Units by mouth every 30 (thirty) days. Pt takes on the 26th every month.   Yes [provider]  acetaminophen (TYLENOL) 325 MG tablet Take 325 mg by mouth every 6 (six) hours as needed for mild pain.     [provider]  bisacodyl (DULCOLAX) 10 MG suppository Place 1 suppository (10 mg total) rectally daily as needed for moderate constipation. 06/21/16   Richarda Overlie, MD  sucralfate (CARAFATE) 1 GM/10ML suspension Take 10 mLs (1 g total) by mouth 4 (four) times daily -  with meals and at bedtime. Patient not taking: Reported on 04/23/2018 03/29/17   Arrien, York Ram, MD    Family History Family History  Problem Relation Age of Onset  . Dementia Mother   . Heart failure Mother   . Heart failure Father   . Parkinsonism Brother     Social History Social History   Tobacco Use  . Smoking status: Never Smoker  . Smokeless tobacco: Never Used  Substance Use Topics  . Alcohol use: No    Alcohol/week: 0.0 oz  . Drug use: No     Allergies   Sulfa antibiotics and Tape   Review of Systems Review of Systems  Unable to perform ROS: Dementia  Constitutional: Positive for fatigue. Negative for chills, diaphoresis and fever.    Respiratory: Positive for cough.   All other systems reviewed and are negative.    Physical Exam Updated Vital Signs BP 135/63 (BP Location: Left Arm)   Pulse 83   Temp 98.2 F (36.8 C) (Oral)   Resp 18   SpO2 92%   Physical Exam  Constitutional: She appears well-developed and well-nourished. No distress.  HENT:  Head: Normocephalic and atraumatic.  Mouth/Throat: Mucous membranes are dry.  Eyes: Conjunctivae are normal.  Neck: Neck supple.  Cardiovascular: Normal rate, regular rhythm, normal heart sounds and intact distal pulses.  Pulmonary/Chest: Effort normal. No respiratory distress. She has rhonchi.  No noted increased work of breathing.  Abdominal: Soft. There is no tenderness. There is no guarding.  Musculoskeletal: She exhibits no edema.  Lymphadenopathy:    She has no cervical adenopathy.  Neurological: She is alert.  Skin: Skin is warm and dry. She is not diaphoretic.  Psychiatric: She has a normal mood and affect. Her behavior is normal.  Nursing note and vitals reviewed.    ED Treatments / Results  Labs (all labs ordered are listed, but only abnormal results are displayed) Labs Reviewed  COMPREHENSIVE METABOLIC PANEL - Abnormal; Notable for the following components:      Result Value   Glucose, Bld 104 (*)    Calcium 8.8 (*)    Albumin 2.8 (*)    All other components within normal limits  CBC WITH DIFFERENTIAL/PLATELET - Abnormal; Notable for the following components:   WBC 14.8 (*)    Neutro Abs 11.6 (*)    Monocytes Absolute 1.2 (*)    All other components within normal limits  URINALYSIS, ROUTINE W REFLEX MICROSCOPIC - Abnormal; Notable for the following components:   Color, Urine AMBER (*)    APPearance HAZY (*)    Leukocytes, UA MODERATE (*)    Bacteria, UA MANY (*)    All other components within normal limits  URINE CULTURE    EKG EKG Interpretation  Date/Time:  Monday April 23 2018 14:56:00 EDT Ventricular Rate:  66 PR Interval:     QRS Duration: 88 QT Interval:  405 QTC Calculation: 425 R Axis:   70 Text Interpretation:  Sinus rhythm Baseline wander in lead(s) V4 No significant change since last tracing Confirmed by Raeford Razor [id number]) on 04/23/2018 3:51:52 PM   Radiology Dg Chest 2 View  Result Date: 04/23/2018 CLINICAL DATA:  Cough for 3 days. EXAM: CHEST - 2 VIEW COMPARISON:  06/25/2017 and prior radiographs FINDINGS: This is a low volume film. Cardiomegaly and mild bibasilar atelectasis identified. Moderate to large hiatal hernia again noted. No definite airspace disease noted. No pleural effusion, pneumothorax or acute bony abnormality. IMPRESSION: Cardiomegaly with mild bibasilar atelectasis in this low volume film. Moderate to large hiatal hernia. Electronically Signed   By: Harmon Pier M.D.   On: 04/23/2018 16:45    Procedures Procedures (including critical care time)  Medications Ordered in ED Medications  cefTRIAXone (ROCEPHIN) 1 g in sodium chloride 0.9 % 100 mL IVPB (0 g Intravenous Stopped 04/23/18 1840)  azithromycin (ZITHROMAX) 500 mg in sodium chloride 0.9 % 250 mL IVPB (500 mg Intravenous New Bag/Given 04/23/18 1823)  sodium chloride 0.9 % bolus 1,000 mL (1,000 mLs Intravenous New Bag/Given 04/23/18 1715)  ipratropium-albuterol (DUONEB) 0.5-2.5 (3) MG/3ML nebulizer solution 3 mL (3 mLs Nebulization Given 04/23/18 1753)     Initial Impression / Assessment and Plan / ED Course  I have reviewed the triage vital signs and the nursing notes.  Pertinent labs & imaging results that were available during my care of the patient were reviewed by me and considered in my medical decision making (see chart for details).  Clinical Course as of Apr 23 1928  Mon Apr 23, 2018  1721 Spoke with Dr. Phillips Odor, palliative care. States they will consult on the patient during her admission.    [SJ]  1927 Spoke with Dr. Julian Reil, hospitalist.  Agrees to admit the patient.   [SJ]    Clinical Course User Index [SJ] Derell Bruun,  Tijah Hane C, PA-C    Patient presents with cough and reported hematuria.  Additionally, she is noted to be hypoxic with room air SPO2 ranging from 88 to 92%, increased to 98% with 2  L supplemental O2.  She has rhonchi and a leukocytosis.  She does not have evidence of acute infiltrate on her chest x-ray, however, she is dry on exam and this could be a factor in the CXR not showing an infiltrate.  Clinical diagnosis of CAP with hypoxia as well as evidence of UTI on UA.  Admission for continued management.  Findings and plan of care discussed with Raeford Razor, MD. Dr. Juleen China personally evaluated and examined this patient.  Vitals:   04/23/18 1515 04/23/18 1600 04/23/18 1615 04/23/18 1645  BP: (!) 137/58 138/65 140/63 133/63  Pulse: 70 70 69 70  Resp: (!) 31 (!) 26 (!) 28 (!) 31  Temp:      TempSrc:      SpO2: 92% 98% 98% 98%     Final Clinical Impressions(s) / ED Diagnoses   Final diagnoses:  Cough  Hypoxia  Cystitis    ED Discharge Orders    None       Concepcion Living 04/23/18 1929    Raeford Razor, MD 04/26/18 1135

## 2018-04-23 NOTE — ED Notes (Signed)
Patients daughter has went home to get something to eat but will be back.

## 2018-04-23 NOTE — ED Triage Notes (Signed)
Arrived via [facility] from [facility] for cough X 3 days. EMS also reports hematuria

## 2018-04-23 NOTE — ED Notes (Signed)
Gave report to Debbie, RN for room 1521.  

## 2018-04-23 NOTE — ED Notes (Signed)
Bed: WA04 Expected date:  Expected time:  Means of arrival:  Comments: 84 yo F, Cough

## 2018-04-23 NOTE — H&P (Addendum)
History and Physical    Tami Harris ZOX:[id number] DOB: [birth year] DOA: 04/23/2018  PCP: Angela Coxasanayaka, Gayani Y, MD  Patient coming from: SNF  I [facility] personally briefly [facility]  Chief Complaint: Cough, hematuria  HPI: Tami Harris is a 84 y.o. female with medical history significant of Alzheimer's, GERD, and HTN, presenting to the ED with cough for the last 3 days. Decreased activity and fatigue.  Staff at the long-term care facility noted hematuria in the patient's brief today.  Daughter is POA, notes that patient has seemed less interested in life over the past 6 to 8 months.  She frequently talks about her recently deceased sisters and wanting to die.   ED Course: UA demonstrates UTI.  Patient satting ~90% on RA.  Put on rocephin / azithro for empiric CAP treatment though CXR was negative.  WBC 15k.   Review of Systems: As per HPI otherwise 10 point review of systems negative.   Past Medical History:  Diagnosis Date  . 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 Allergic rhinitis   . Alzheimer's dementia dx'd 2008  . Diverticulosis   . Fall   . Family history of adverse reaction to anesthesia    daughter reports "either faulty tube crimped or swelling from allergic reaction caused the tube to crimp"  . GERD (gastroesophageal reflux disease)   . Headache    "fragrant related"  . Hiatal hernia   . Memory loss   . Pneumonia 2013  . Radial fracture   . Seasonal allergies     Past Surgical History:  Procedure Laterality Date  . BLADDER SUSPENSION  X 2  . CATARACT EXTRACTION W/ INTRAOCULAR LENS IMPLANT Right   . DILATION AND CURETTAGE OF UTERUS    . FEMUR IM NAIL Right 06/17/2016   Procedure: INTRAMEDULLARY (IM) NAIL FEMORAL;  Surgeon: Sheral Apleyimothy D Murphy, MD;  Location: [facility] OR;  Service: Orthopedics;  Laterality: Right;  . INGUINAL HERNIA REPAIR Right   . PERIPHERAL  VASCULAR CATHETERIZATION N/A 05/16/2016   Procedure: Lower Extremity Angiography;  Surgeon: Runell GessJonathan J Berry, MD;  [facility];  Service: Cardiovascular;  Laterality: N/A;     reports that she has never smoked. She has never used smokeless tobacco. She reports that she does not drink alcohol or use drugs.  Allergies  Allergen Reactions  . Sulfa Antibiotics Other (See Comments)    Reaction:  Unknown   . Tape Other (See Comments)    Reaction:  Tears pts skin     Family History  Problem Relation Age of Onset  . Dementia Mother   . Heart failure Mother   . Heart failure Father   . Parkinsonism Brother      Prior to Admission medications   Medication Sig Start Date End Date Taking? Authorizing Provider  aspirin EC 81 MG tablet Take 1 tablet (81 mg total) by mouth daily. 06/26/17 06/26/18 Yes Richarda OverlieAbrol, Nayana, MD  docusate sodium (COLACE) 100 MG capsule Take 1 capsule (100 mg total) by mouth 2 (two) times daily. 06/21/16  Yes Richarda OverlieAbrol, Nayana, MD  donepezil (ARICEPT) 10 MG tablet Take 10 mg by mouth at bedtime.   Yes [provider]  ferrous sulfate (FERROUSUL) 325 (65 FE) MG tablet Take 1 tablet (325 mg total) by mouth 2 (two) times daily with a meal. 06/21/16  Yes Richarda OverlieAbrol, Nayana, MD  lisinopril (PRINIVIL,ZESTRIL) 10 MG tablet  Take 10 mg by mouth daily. 03/27/18  Yes [provider]  loperamide (IMODIUM) 2 MG capsule Take 2 mg by mouth daily as needed for diarrhea or loose stools.   Yes [provider]  Multiple Vitamin (MULTIVITAMIN WITH MINERALS) TABS tablet Take 1 tablet by mouth daily.   Yes [provider]  omega-3 acid ethyl esters (LOVAZA) 1 g capsule Take 1 g by mouth 2 (two) times daily.   Yes [provider]  pantoprazole (PROTONIX) 40 MG tablet Take 1 tablet (40 mg total) by mouth 2 (two) times daily. 03/29/17 04/23/18 Yes Arrien, York Ram, MD  potassium chloride SA (K-DUR,KLOR-CON) 20 MEQ tablet Take 1 tablet (20 mEq total)  by mouth daily. 06/26/17  Yes Richarda Overlie, MD  Skin Protectants, Misc. (EUCERIN) cream Apply 1 application topically 2 (two) times daily as needed for dry skin. Pt applies to both feet.   Yes [provider]  sucralfate (CARAFATE) 1 g tablet Take 1 g by mouth 4 (four) times daily. 04/20/18  Yes [provider]  Vitamin D, Ergocalciferol, (DRISDOL) 50000 units CAPS capsule Take 50,000 Units by mouth every 30 (thirty) days. Pt takes on the 26th every month.   Yes [provider]  acetaminophen (TYLENOL) 325 MG tablet Take 325 mg by mouth every 6 (six) hours as needed for mild pain.     [provider]  bisacodyl (DULCOLAX) 10 MG suppository Place 1 suppository (10 mg total) rectally daily as needed for moderate constipation. 06/21/16   Richarda Overlie, MD    Physical Exam: Vitals:   04/23/18 1830 04/23/18 1900 04/23/18 1937 04/23/18 2051  BP:  133/61  (!) 144/57  Pulse: 82 75  66  Resp: (!) 21 (!) 23  19  Temp:   98.2 F (36.8 C) 98.2 F (36.8 C)  TempSrc:   Oral   SpO2: 97% 99%  100%    Constitutional: NAD, calm, comfortable Eyes: PERRL, lids and conjunctivae normal ENMT: Mucous membranes are dry. Posterior pharynx clear of any exudate or lesions.Normal dentition.  Neck: normal, supple, no masses, no thyromegaly Respiratory: Rhonchi, no increased WOB Cardiovascular: Regular rate and rhythm, no murmurs / rubs / gallops. No extremity edema. 2+ pedal pulses. No carotid bruits.  Abdomen: no tenderness, no masses palpated. No hepatosplenomegaly. Bowel sounds positive.  Musculoskeletal: no clubbing / cyanosis. No joint deformity upper and lower extremities. Good ROM, no contractures. Normal muscle tone.  Skin: no rashes, lesions, ulcers. No induration Neurologic: CN 2-12 grossly intact. Sensation intact, DTR normal. Strength 5/5 in all 4.  Psychiatric: Normal judgment and insight. Alert and oriented x 3. Normal mood.    Labs on Admission: I have personally  reviewed following labs and imaging studies  CBC: Recent Labs  Lab 04/23/18 1601  WBC 14.8*  NEUTROABS 11.6*  HGB 13.9  HCT 41.3  MCV 93.7  PLT 353   Basic Metabolic Panel: Recent Labs  Lab 04/23/18 1601  NA 144  K 3.7  CL 107  CO2 24  GLUCOSE 104*  BUN 12  CREATININE 0.77  CALCIUM 8.8*   GFR: CrCl cannot be calculated (Unknown ideal weight.). Liver Function Tests: Recent Labs  Lab 04/23/18 1601  AST 24  ALT 19  ALKPHOS 81  BILITOT 0.7  PROT 6.5  ALBUMIN 2.8*   No results for input(s): LIPASE, AMYLASE in the last 168 hours. No results for input(s): AMMONIA in the last 168 hours. Coagulation Profile: No results for input(s): INR, PROTIME in the last  168 hours. Cardiac Enzymes: No results for input(s): CKTOTAL, CKMB, CKMBINDEX, TROPONINI in the last 168 hours. BNP (last 3 results) No results for input(s): PROBNP in the last 8760 hours. HbA1C: No results for input(s): HGBA1C in the last 72 hours. CBG: No results for input(s): GLUCAP in the last 168 hours. Lipid Profile: No results for input(s): CHOL, HDL, LDLCALC, TRIG, CHOLHDL, LDLDIRECT in the last 72 hours. Thyroid Function Tests: No results for input(s): TSH, T4TOTAL, FREET4, T3FREE, THYROIDAB in the last 72 hours. Anemia Panel: No results for input(s): VITAMINB12, FOLATE, FERRITIN, TIBC, IRON, RETICCTPCT in the last 72 hours. Urine analysis:    Component Value Date/Time   COLORURINE AMBER (A) 04/23/2018 1823   APPEARANCEUR HAZY (A) 04/23/2018 1823   LABSPEC 1.026 04/23/2018 1823   PHURINE 5.0 04/23/2018 1823   GLUCOSEU NEGATIVE 04/23/2018 1823   HGBUR NEGATIVE 04/23/2018 1823   BILIRUBINUR NEGATIVE 04/23/2018 1823   KETONESUR NEGATIVE 04/23/2018 1823   PROTEINUR NEGATIVE 04/23/2018 1823   UROBILINOGEN 0.2 06/15/2011 1033   NITRITE NEGATIVE 04/23/2018 1823   LEUKOCYTESUR MODERATE (A) 04/23/2018 1823    Radiological Exams on Admission: Dg Chest 2 View  Result Date: 04/23/2018 CLINICAL DATA:   Cough for 3 days. EXAM: CHEST - 2 VIEW COMPARISON:  06/25/2017 and prior radiographs FINDINGS: This is a low volume film. Cardiomegaly and mild bibasilar atelectasis identified. Moderate to large hiatal hernia again noted. No definite airspace disease noted. No pleural effusion, pneumothorax or acute bony abnormality. IMPRESSION: Cardiomegaly with mild bibasilar atelectasis in this low volume film. Moderate to large hiatal hernia. Electronically Signed   By: Harmon Pier M.D.   On: 04/23/2018 16:45    EKG: Independently reviewed.  Assessment/Plan Principal Problem:   CAP (community acquired pneumonia) Active Problems:   Dementia   Acute lower UTI    1. CAP -  1. Treat as CAP given O2 requirement, rhonchi, leukocytosis 2. PNA pathway 3. Rocephin / azithro 4. BCx pending 2. UTI - 1. Rocephin 2. UCx pending 3. Dementia - 1. Continue home meds 2. Pal care consult in AM 3. Check TSH 4. Is DNR, MOST form on file   DVT prophylaxis: Lovenox Code Status: DNR Family Communication: No family in room Disposition Plan: SNF after admit Consults called: Pal care Admission status: Admit to inpatient, new oxygen requirement   Hillary Bow DO [facility] Pager [phone] Only works nights!  If 7AM-7PM, please contact the primary day team physician taking care of patient  [email or web address] Password [facility]  04/23/2018, 8:53 PM

## 2018-04-24 DIAGNOSIS — Z7189 Other specified counseling: Secondary | ICD-10-CM

## 2018-04-24 LAB — STREP PNEUMONIAE URINARY ANTIGEN: Strep Pneumo Urinary Antigen: NEGATIVE

## 2018-04-24 LAB — MRSA PCR SCREENING: MRSA BY PCR: POSITIVE — AB

## 2018-04-24 LAB — HIV ANTIBODY (ROUTINE TESTING W REFLEX): HIV SCREEN 4TH GENERATION: NONREACTIVE

## 2018-04-24 LAB — TSH: TSH: 0.482 u[IU]/mL (ref 0.350–4.500)

## 2018-04-24 MED ORDER — GUAIFENESIN-DM 100-10 MG/5ML PO SYRP
5.0000 mL | ORAL_SOLUTION | ORAL | Status: DC | PRN
  Administered 2018-04-24 (×2): 5 mL via ORAL
  Filled 2018-04-24 (×2): qty 10

## 2018-04-24 MED ORDER — MUPIROCIN 2 % EX OINT
1.0000 "application " | TOPICAL_OINTMENT | Freq: Two times a day (BID) | CUTANEOUS | Status: DC
  Administered 2018-04-24 – 2018-04-26 (×5): 1 via NASAL
  Filled 2018-04-24: qty 22

## 2018-04-24 MED ORDER — CHLORHEXIDINE GLUCONATE CLOTH 2 % EX PADS: 6.0000 | MEDICATED_PAD | Freq: Every day | CUTANEOUS | Status: DC

## 2018-04-24 MED ORDER — SODIUM CHLORIDE 0.9 % IV SOLN: INTRAVENOUS | Status: DC | PRN

## 2018-04-24 NOTE — Evaluation (Signed)
Clinical/Bedside Swallow Evaluation Patient Details  Name: Oswald HillockHilda B Yerkes MRN: [id number] Date of Birth: 1923-11-03  Today's Date: 04/24/2018 Time: SLP Start Time (ACUTE ONLY): 1518 SLP Stop Time (ACUTE ONLY): 1537 SLP Time Calculation (min) (ACUTE ONLY): 19 min  Past Medical History:  Past Medical History:  Diagnosis Date  . 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 Allergic rhinitis   . Alzheimer's dementia dx'd 2008  . Diverticulosis   . Fall   . Family history of adverse reaction to anesthesia    daughter reports "either faulty tube crimped or swelling from allergic reaction caused the tube to crimp"  . GERD (gastroesophageal reflux disease)   . Headache    "fragrant related"  . Hiatal hernia   . Memory loss   . Pneumonia 2013  . Radial fracture   . Seasonal allergies    Past Surgical History:  Past Surgical History:  Procedure Laterality Date  . BLADDER SUSPENSION  X 2  . CATARACT EXTRACTION W/ INTRAOCULAR LENS IMPLANT Right   . DILATION AND CURETTAGE OF UTERUS    . FEMUR IM NAIL Right 06/17/2016   Procedure: INTRAMEDULLARY (IM) NAIL FEMORAL;  Surgeon: Sheral Apleyimothy D Murphy, MD;  Location: [facility] OR;  Service: Orthopedics;  Laterality: Right;  . INGUINAL HERNIA REPAIR Right   . PERIPHERAL VASCULAR CATHETERIZATION N/A 05/16/2016   Procedure: Lower Extremity Angiography;  Surgeon: Runell GessJonathan J Berry, MD;  [facility];  Service: Cardiovascular;  Laterality: N/A;   HPI:  Pt is a 84 y.o.female, with a history ofAlzheimer's, GERD, HH (moderate to large per CXR on admission), PNA, and HTN, presenting to the ED with several days of coughing. CXR without definiate airsapce disease.   Assessment / Plan / Recommendation Clinical Impression  Pt has intermittent, delayed throat clearing across all consistencies tested but with delayed coughing only twice across all trials. Question if this throat clearing could be in relation  to the moderate-large hiatal hernia noted on imaging, although this is not entirely clear with bedside assessment. Pt self-feeds, but she needs cues to initiate intake with almost every bolus. Oral preparation is only mildly prolonged. Her daughter was leaving as SLP arrived and commented on changing her dinner order to a softer option. Would continue with Dys 3 diet and thin liquids for now, but recommend SLP f/u for further evaluation of tolerance, particularly in light of cognitive status and concern for PNA. Pt may benefit from MBS to better assess oropharyngeal function to facilitate GOC conversations. SLP Visit Diagnosis: Dysphagia, unspecified (R13.10)    Aspiration Risk  Mild aspiration risk    Diet Recommendation Dysphagia 3 (Mech soft);Thin liquid   Liquid Administration via: Cup;Straw Medication Administration: Whole meds with liquid Supervision: Patient able to self feed;Intermittent supervision to cue for compensatory strategies(pt will likely need supervision/cues for adequate intake) Compensations: Minimize environmental distractions;Slow rate;Small sips/bites Postural Changes: Seated upright at 90 degrees;Remain upright for at least 30 minutes after po intake    Other  Recommendations Oral Care Recommendations: Oral care BID   Follow up Recommendations Skilled Nursing facility      Frequency and Duration min 2x/week  2 weeks       Prognosis Prognosis for Safe Diet Advancement: Fair Barriers to Reach Goals: Cognitive deficits      Swallow Study   General HPI: Pt is a 84 y.o.female, with a history ofAlzheimer's, GERD, HH (moderate to large per CXR on admission), PNA, and HTN,  presenting to the ED with several days of coughing. CXR without definiate airsapce disease. Type of Study: Bedside Swallow Evaluation Previous Swallow Assessment: none in chart Diet Prior to this Study: Regular;Thin liquids Temperature Spikes Noted: No Respiratory Status: Nasal cannula History  of Recent Intubation: No Behavior/Cognition: Alert;Cooperative;Requires cueing Oral Cavity Assessment: Within Functional Limits Oral Care Completed by SLP: No Oral Cavity - Dentition: Adequate natural dentition Vision: Functional for self-feeding Self-Feeding Abilities: Able to feed self(needs cues for initiation ) Patient Positioning: Upright in bed Baseline Vocal Quality: Normal Volitional Cough: Strong;Congested Volitional Swallow: Able to elicit    Oral/Motor/Sensory Function Overall Oral Motor/Sensory Function: Within functional limits   Ice Chips Ice chips: Not tested   Thin Liquid Thin Liquid: Impaired Presentation: Cup;Self Fed;Straw Pharyngeal  Phase Impairments: Cough - Delayed;Throat Clearing - Delayed    Nectar Thick Nectar Thick Liquid: Not tested   Honey Thick Honey Thick Liquid: Not tested   Puree Puree: Impaired Presentation: Self Fed;Spoon Pharyngeal Phase Impairments: Throat Clearing - Delayed;Cough - Delayed   Solid     Solid: Impaired Presentation: Self Fed Pharyngeal Phase Impairments: Throat Clearing - Delayed;Cough - Delayed      Maxcine Ham 04/24/2018,3:52 PM   Maxcine Ham, M.A. CCC-SLP [phone]

## 2018-04-24 NOTE — Progress Notes (Signed)
Patient Demographics:    Tami Harris, is a 84 y.o. female, DOB - [birth year], AVW:[id number]  Admit date - 04/23/2018   Admitting Physician Hillary Bow, DO  Outpatient Primary MD for the patient is Dasanayaka, Newton Pigg, MD  LOS - 1   Chief Complaint  Patient presents with  . Cough  . Hematuria        Subjective:    Tami Harris today has no fevers, no emesis,  No chest pain, cough persist,   Assessment  & Plan :    Principal Problem:   CAP (community acquired pneumonia) Active Problems:   Dementia   Acute lower UTI   Advanced care planning/counseling discussion  Brief Summary 84 y.o. female  with past medical history of Alzheimer's dementia, PVD, hip fracture with pin repair, GI bleed, HTN, macular degeneration, diverticulosis, hiatal hernia admitted on 04/23/2018 with cough x 3 days, hematuria and UA suggestive of UTI  Plan:-  1) possible UTI--- initial UA suggest possible UTI, patient had leukocytosis with white count of 14.8 on admission, currently on IV Rocephin pending urine and blood cultures from 04/23/2017  2)Grieving----apparently patient sisters passed away recently, she has been more depressed since then, palliative care input appreciated, patient not interested in pharmacological treatments for mood concerns, she is hoping she can qualify for home hospice  3)FE/possible dysphagia--cough and swallowing and aspiration concerns, speech pathologist eval appreciated,  admission chest x-ray without definite pneumonia.  Dysphagia 3 diet with thin liquids advised for now pending modified barium swallow on 04/25/18, to new antitussives and bronchiodilators  4)Dementia--with cognitive deficits, no significant behavioral concerns at this time, palliative care consult appreciated,, TSH is normal, continue Aricept  5)Social/Ethics--palliative care consult appreciated, patient is a DNR/DNI,  patient has advanced directives, possible discharge home on 04/25/2018 with home hospice after modified barium swallow is completed  Code Status : DNR  Disposition Plan  : Consider discharge home on 04/25/2018 with in home hospice after modified barium swallow study is completed  Consults  : Palliative care services, speech pathologist   DVT Prophylaxis  :  Lovenox   Lab Results  Component Value Date   PLT 353 04/23/2018    Inpatient Medications  Scheduled Meds: . aspirin EC  81 mg Oral Daily  . Chlorhexidine Gluconate Cloth  6 each Topical Q0600  . docusate sodium  100 mg Oral BID  . donepezil  10 mg Oral QHS  . enoxaparin (LOVENOX) injection  40 mg Subcutaneous QHS  . ferrous sulfate  325 mg Oral BID WC  . lisinopril  10 mg Oral Daily  . multivitamin with minerals  1 tablet Oral Daily  . mupirocin ointment  1 application Nasal BID  . omega-3 acid ethyl esters  1 g Oral BID  . pantoprazole  40 mg Oral BID  . potassium chloride SA  20 mEq Oral Daily  . sucralfate  1 g Oral TID AC & HS   Continuous Infusions: . sodium chloride    . cefTRIAXone (ROCEPHIN)  IV 1 g (04/24/18 1656)   PRN Meds:.sodium chloride, bisacodyl, guaiFENesin-dextromethorphan    Anti-infectives (From admission, onward)   Start     Dose/Rate Route Frequency Ordered Stop   04/24/18 1800  azithromycin (ZITHROMAX) tablet 500 mg  500 mg Oral Every 24 hours 04/23/18 1950 04/24/18 1656   04/24/18 1700  cefTRIAXone (ROCEPHIN) 1 g in sodium chloride 0.9 % 100 mL IVPB     1 g 200 mL/hr over 30 Minutes Intravenous Every 24 hours 04/23/18 1950 05/01/18 1659   04/23/18 1715  cefTRIAXone (ROCEPHIN) 1 g in sodium chloride 0.9 % 100 mL IVPB     1 g 200 mL/hr over 30 Minutes Intravenous  Once 04/23/18 1701 04/23/18 1840   04/23/18 1715  azithromycin (ZITHROMAX) 500 mg in sodium chloride 0.9 % 250 mL IVPB     500 mg 250 mL/hr over 60 Minutes Intravenous  Once 04/23/18 1701 04/23/18 1936        Objective:    Vitals:   04/23/18 2051 04/23/18 2200 04/24/18 0439 04/24/18 1417  BP: (!) 144/57  (!) 143/61 129/66  Pulse: 66  70 63  Resp: 19  17 20   Temp: 98.2 F (36.8 C)  97.9 F (36.6 C) 98.1 F (36.7 C)  TempSrc:    Oral  SpO2: 100%  100% 97%  Weight:  65.4 kg (144 lb 2.9 oz)    Height:  5\' 4"  (1.626 m)      Wt Readings from Last 3 Encounters:  04/23/18 65.4 kg (144 lb 2.9 oz)  06/26/17 66.1 kg (145 lb 11.6 oz)  03/29/17 70.8 kg (156 lb 1.4 oz)     Intake/Output Summary (Last 24 hours) at 04/24/2018 1744 Last data filed at 04/24/2018 1700 Gross per 24 hour  Intake 373.33 ml  Output -  Net 373.33 ml     Physical Exam  Gen:- Awake Alert, chronically ill-appearing HEENT:- [place].AT, No sclera icterus Neck-Supple Neck,No JVD,.  Lungs-diminished in bases, no wheezing, no rales CV- S1, S2 normal Abd-  +ve B.Sounds, Abd Soft, No tenderness,    Extremity/Skin:- No  edema,   good pulses Psych-affect is appropriate, oriented x3 Neuro-no new focal deficits, no tremors   Data Review:   Micro Results Recent Results (from the past 240 hour(s))  Culture, blood (routine x 2) Call MD if unable to obtain prior to antibiotics being given     Status: None (Preliminary result)   Collection Time: 04/23/18  8:10 PM  Result Value Ref Range Status   Specimen Description   Final    BLOOD LEFT ANTECUBITAL Performed at [facility], [address] [place], [place] [address]    Special Requests   Final    BOTTLES DRAWN AEROBIC AND ANAEROBIC Blood Culture adequate volume Performed at [facility], [address] [place], [place] [address]    Culture   Final    NO GROWTH < 24 HOURS Performed at [facility], [address] [place], [place] [address]    Report Status PENDING  Incomplete  Culture, blood (routine x 2) Call MD if unable to obtain prior to antibiotics being given     Status: None (Preliminary result)   Collection Time: 04/23/18  9:05 PM   Result Value Ref Range Status   Specimen Description   Final    BLOOD BLOOD RIGHT HAND Performed at [facility], [address] [place], [place] [address]    Special Requests   Final    BOTTLES DRAWN AEROBIC AND ANAEROBIC Blood Culture adequate volume Performed at [facility], [address] [place], [place] [address]    Culture   Final    NO GROWTH < 24 HOURS Performed at [facility], [address] [place], [place] [address]    Report Status PENDING  Incomplete  MRSA PCR Screening     Status: Abnormal   Collection Time: 04/24/18 12:30 AM  Result Value Ref Range Status   MRSA by PCR POSITIVE (A) NEGATIVE Final    Comment:        The GeneXpert MRSA Assay (FDA approved for NASAL specimens only), is one component of a comprehensive MRSA colonization surveillance program. It is not intended to diagnose MRSA infection nor to guide or monitor treatment for MRSA infections. RESULT CALLED TO, READ BACK BY AND VERIFIED WITH: D HILL,RN @0254  04/24/18 MKELLY Performed at [facility], [address] [place], [place] [address]    Radiology Reports Dg Chest 2 View  Result Date: 04/23/2018 CLINICAL DATA:  Cough for 3 days. EXAM: CHEST - 2 VIEW COMPARISON:  06/25/2017 and prior radiographs FINDINGS: This is a low volume film. Cardiomegaly and mild bibasilar atelectasis identified. Moderate to large hiatal hernia again noted. No definite airspace disease noted. No pleural effusion, pneumothorax or acute bony abnormality. IMPRESSION: Cardiomegaly with mild bibasilar atelectasis in this low volume film. Moderate to large hiatal hernia. Electronically Signed   By: Harmon Pier M.D.   On: 04/23/2018 16:45     CBC Recent Labs  Lab 04/23/18 1601  WBC 14.8*  HGB 13.9  HCT 41.3  PLT 353  MCV 93.7  MCH 31.5  MCHC 33.7  RDW 13.1  LYMPHSABS 1.8  MONOABS 1.2*  EOSABS 0.1  BASOSABS 0.1   Chemistries  Recent Labs  Lab  04/23/18 1601  NA 144  K 3.7  CL 107  CO2 24  GLUCOSE 104*  BUN 12  CREATININE 0.77  CALCIUM 8.8*  AST 24  ALT 19  ALKPHOS 81  BILITOT 0.7   ------------------------------------------------------------------------------------------------------------------ No results for input(s): CHOL, HDL, LDLCALC, TRIG, CHOLHDL, LDLDIRECT in the last 72 hours.  No results found for: HGBA1C ------------------------------------------------------------------------------------------------------------------ Recent Labs    04/24/18 0516  TSH 0.482   ------------------------------------------------------------------------------------------------------------------ No results for input(s): VITAMINB12, FOLATE, FERRITIN, TIBC, IRON, RETICCTPCT in the last 72 hours.  Coagulation profile No results for input(s): INR, PROTIME in the last 168 hours.  No results for input(s): DDIMER in the last 72 hours.  Cardiac Enzymes No results for input(s): CKMB, TROPONINI, MYOGLOBIN in the last 168 hours.  Invalid input(s): CK ------------------------------------------------------------------------------------------------------------------    Component Value Date/Time   BNP 91.5 06/25/2017 1515    Gurshaan Matsuoka M.D on 04/24/2018 at 5:44 PM  Go to [email or web address] - password [email or web address] for contact info  [facility] - Office  [phone]

## 2018-04-24 NOTE — Progress Notes (Addendum)
Consultation Note Date: 04/24/2018   Patient Name: Tami Harris  DOB: [birth year]  MRN: [id number]  Age / Sex: 84 y.o., female  PCP: Merlene Laughter, MD Referring Physician: Roxan Hockey, MD  Reason for Consultation: Establishing goals of care  HPI/Patient Profile: 84 y.o. female  with past medical history of Alzheimer's dementia, PVD, hip fracture with pin repair, GI bleed, HTN, macular degeneration, diverticulosis, hiatal hernia admitted on 04/23/2018 with cough x 3 days, hypoxia, hematuria. She is being treated for UTI and pneumonia. Palliative medicine consulted for "decreased will to live".   Clinical Assessment and Goals of Care:  I have reviewed medical records including EPIC notes, labs and imaging, assessed the patient and then met at the bedside along with patient's daughter- who is her HCPOA- Corey Harold-  to discuss diagnosis prognosis, [place], EOL wishes, disposition and options.  I introduced Palliative Medicine as specialized medical care for people living with serious illness. It focuses on providing relief from the symptoms and stress of a serious illness. The goal is to improve quality of life for both the patient and the family.  The patient is currently followed outpatient by [facility]. She was last seen by Wynell Balloon, NP in May of this year.    As far as functional and nutritional status - Kennyth Lose has noted significant declines in the last 6-8 weeks. She states her Mom has lost interest in doing things. She is able to feed herself, but has stopped wanting to eat. Kennyth Lose endorses weight loss. I attempted to feed her at the bedside and noted her to have some difficulty swallowing. She denied pain. Kennyth Lose states her Mom was previously ambulating with a walker- and that she should still be able to- but she has been told by PT staff at the facility that her Mom is no  longer walking "because they don't make her". Lounell was able to tell me she was in "the [facility]"- but not the year. She said her favorite food was coconut.  Kennyth Lose feels there has been decline since patient's sisters died. Patient has said that she has been visited by one of her sisters. Patient often expresses that she is ready to die.   We discussed their current illness and what it means in the larger context of their on-going co-morbidities.  Natural disease trajectory and expectations at EOL were discussed. I attempted to elicit values and goals of care important to the patient.  Kennyth Lose was very clear that comfort and quality of life were the patient's main goals of care.   The difference between aggressive medical intervention and comfort care was considered in light of the patient's goals of care. MOST form was completed. Kennyth Lose elected DNR, COMFORT MEASURES ONLY-  Transfer to hospital only if comfort can not be met in current setting. No IV fluids or artificial feeding.   Advanced directives, concepts specific to code status, artifical feeding and hydration, and rehospitalization were considered and discussed.  Hospice and Palliative Care services outpatient were explained  and offered. Kennyth Lose would like Hospice services at discharge.  Questions and concerns were addressed.  Hard Choices booklet left for review. The family was encouraged to call with questions or concerns.    Primary Decision Maker HCPOA- Corey Harold    SUMMARY OF RECOMMENDATIONS -SLP eval for possible aspiration- this will affect prognosis and possible Hospice referral- I am concerned that patient's dementia alone unfortunately, is not advanced enough for Hospice- however- if she is aspirating, pneumonia is likely to recur and comfort measures would be the chosen path- then Hospice would be appropriate -PT eval for current functional status- this will also assist in clarifying prognosis -Continue to  treat what is treatable while patient is inpatient, then d/c- hopefully with Hospice.     Code Status/Advance Care Planning:  DNR  Palliative Prophylaxis:   Aspiration and Delirium Protocol  Additional Recommendations (Limitations, Scope, Preferences):  No Surgical Procedures  Prognosis:    Unable to determine  Discharge Planning: To Be Determined  Primary Diagnoses: Present on Admission: . CAP (community acquired pneumonia) . Acute lower UTI . Dementia   I have reviewed the medical record, interviewed the patient and family, and examined the patient. The following aspects are pertinent.  Past Medical History:  Diagnosis Date  . 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 Allergic rhinitis   . Alzheimer's dementia dx'd 2008  . Diverticulosis   . Fall   . Family history of adverse reaction to anesthesia    daughter reports "either faulty tube crimped or swelling from allergic reaction caused the tube to crimp"  . GERD (gastroesophageal reflux disease)   . Headache    "fragrant related"  . Hiatal hernia   . Memory loss   . Pneumonia 2013  . Radial fracture   . Seasonal allergies    Social History   Socioeconomic History  . Marital status: Widowed    Spouse name: Not on file  . Number of children: Not on file  . Years of education: Not on file  . Highest education level: Not on file  Occupational History  . Not on file  Social Needs  . Financial resource strain: Not on file  . Food insecurity:    Worry: Not on file    Inability: Not on file  . Transportation needs:    Medical: Not on file    Non-medical: Not on file  Tobacco Use  . Smoking status: Never Smoker  . Smokeless tobacco: Never Used  Substance and Sexual Activity  . Alcohol use: No    Alcohol/week: 0.0 oz  . Drug use: No  . Sexual activity: Never  Lifestyle  . Physical activity:    Days per week: Not on file    Minutes per session: Not on  file  . Stress: Not on file  Relationships  . Social connections:    Talks on phone: Not on file    Gets together: Not on file    Attends religious service: Not on file    Active member of club or organization: Not on file    Attends meetings of clubs or organizations: Not on file    Relationship status: Not on file  Other Topics Concern  . Not on file  Social History Narrative   [facility] with a roommate.  Has 1 daughter.  Retired from a Special educational needs teacher.  Education: high school.   Family History  Problem Relation Age of Onset  .  Dementia Mother   . Heart failure Mother   . Heart failure Father   . Parkinsonism Brother    Scheduled Meds: . aspirin EC  81 mg Oral Daily  . azithromycin  500 mg Oral Q24H  . Chlorhexidine Gluconate Cloth  6 each Topical Q0600  . docusate sodium  100 mg Oral BID  . donepezil  10 mg Oral QHS  . enoxaparin (LOVENOX) injection  40 mg Subcutaneous QHS  . ferrous sulfate  325 mg Oral BID WC  . lisinopril  10 mg Oral Daily  . multivitamin with minerals  1 tablet Oral Daily  . mupirocin ointment  1 application Nasal BID  . omega-3 acid ethyl esters  1 g Oral BID  . pantoprazole  40 mg Oral BID  . potassium chloride SA  20 mEq Oral Daily  . sucralfate  1 g Oral TID AC & HS   Continuous Infusions: . sodium chloride    . cefTRIAXone (ROCEPHIN)  IV     PRN Meds:.sodium chloride, bisacodyl, guaiFENesin-dextromethorphan Medications Prior to Admission:  Prior to Admission medications   Medication Sig Start Date End Date Taking? Authorizing Provider  aspirin EC 81 MG tablet Take 1 tablet (81 mg total) by mouth daily. 06/26/17 06/26/18 Yes Reyne Dumas, MD  docusate sodium (COLACE) 100 MG capsule Take 1 capsule (100 mg total) by mouth 2 (two) times daily. 06/21/16  Yes Reyne Dumas, MD  donepezil (ARICEPT) 10 MG tablet Take 10 mg by mouth at bedtime.   Yes [provider]  ferrous sulfate (FERROUSUL) 325 (65 FE) MG tablet Take 1  tablet (325 mg total) by mouth 2 (two) times daily with a meal. 06/21/16  Yes Reyne Dumas, MD  lisinopril (PRINIVIL,ZESTRIL) 10 MG tablet Take 10 mg by mouth daily. 03/27/18  Yes [provider]  loperamide (IMODIUM) 2 MG capsule Take 2 mg by mouth daily as needed for diarrhea or loose stools.   Yes [provider]  Multiple Vitamin (MULTIVITAMIN WITH MINERALS) TABS tablet Take 1 tablet by mouth daily.   Yes [provider]  omega-3 acid ethyl esters (LOVAZA) 1 g capsule Take 1 g by mouth 2 (two) times daily.   Yes [provider]  pantoprazole (PROTONIX) 40 MG tablet Take 1 tablet (40 mg total) by mouth 2 (two) times daily. 03/29/17 04/23/18 Yes Arrien, Jimmy Picket, MD  potassium chloride SA (K-DUR,KLOR-CON) 20 MEQ tablet Take 1 tablet (20 mEq total) by mouth daily. 06/26/17  Yes Reyne Dumas, MD  Skin Protectants, Misc. (EUCERIN) cream Apply 1 application topically 2 (two) times daily as needed for dry skin. Pt applies to both feet.   Yes [provider]  sucralfate (CARAFATE) 1 g tablet Take 1 g by mouth 4 (four) times daily. 04/20/18  Yes [provider]  Vitamin D, Ergocalciferol, (DRISDOL) 50000 units CAPS capsule Take 50,000 Units by mouth every 30 (thirty) days. Pt takes on the 26th every month.   Yes [provider]  acetaminophen (TYLENOL) 325 MG tablet Take 325 mg by mouth every 6 (six) hours as needed for mild pain.     [provider]  bisacodyl (DULCOLAX) 10 MG suppository Place 1 suppository (10 mg total) rectally daily as needed for moderate constipation. 06/21/16   Reyne Dumas, MD   Allergies  Allergen Reactions  . Sulfa Antibiotics Other (See Comments)    Reaction:  Unknown   . Tape Other (See Comments)    Reaction:  Tears pts skin  Review of Systems  Constitutional: Positive for activity change and appetite change.  Respiratory: Positive for cough.   Psychiatric/Behavioral: The patient is not  nervous/anxious.     Physical Exam  Constitutional: She appears well-developed and well-nourished.  Cardiovascular: Normal rate.  Pulmonary/Chest: Effort normal.  Musculoskeletal:  weak  Neurological:  lethargic  Psychiatric:  Flat affect  Nursing note and vitals reviewed.   Vital Signs: BP 129/66 (BP Location: Left Arm)   Pulse 63   Temp 98.1 F (36.7 C) (Oral)   Resp 20   Ht _0  (1.626 m)   Wt 65.4 kg (144 lb 2.9 oz)   SpO2 97%   BMI 24.75 kg/m  Pain Scale: 0-10 POSS *See Group Information*: S-Acceptable,Sleep, easy to arouse Pain Score: Asleep   SpO2: SpO2: 97 % O2 Device:SpO2: 97 % O2 Flow Rate: .O2 Flow Rate (L/min): 2 L/min  IO: Intake/output summary:   Intake/Output Summary (Last 24 hours) at 04/24/2018 1509 Last data filed at 04/24/2018 1452 Gross per 24 hour  Intake 360 ml  Output -  Net 360 ml    LBM: Last BM Date: 04/23/18 Baseline Weight: Weight: 65.4 kg (144 lb 2.9 oz) Most recent weight: Weight: 65.4 kg (144 lb 2.9 oz)     Palliative Assessment/Data: PPS: 30%     Thank you for this consult. Palliative medicine will continue to follow and assist as needed.   Time In: 1400 Time Out: 1530 Time Total: 90 minutes Greater than 50%  of this time was spent counseling and coordinating care related to the above assessment and plan.  Signed by: Mariana Kaufman, AGNP-C Palliative Medicine    Please contact Palliative Medicine Team phone at [phone] for questions and concerns.  For individual provider: See Shea Evans

## 2018-04-25 ENCOUNTER — Inpatient Hospital Stay (HOSPITAL_COMMUNITY): Payer: Medicare Other

## 2018-04-25 DIAGNOSIS — N390 Urinary tract infection, site not specified: Secondary | ICD-10-CM

## 2018-04-25 DIAGNOSIS — G309 Alzheimer's disease, unspecified: Secondary | ICD-10-CM

## 2018-04-25 DIAGNOSIS — Z515 Encounter for palliative care: Secondary | ICD-10-CM

## 2018-04-25 DIAGNOSIS — F0280 Dementia in other diseases classified elsewhere without behavioral disturbance: Secondary | ICD-10-CM

## 2018-04-25 LAB — BASIC METABOLIC PANEL
Anion gap: 9 (ref 5–15)
BUN: 9 mg/dL (ref 8–23)
CALCIUM: 8.7 mg/dL — AB (ref 8.9–10.3)
CO2: 27 mmol/L (ref 22–32)
Chloride: 109 mmol/L (ref 98–111)
Creatinine, Ser: 0.83 mg/dL (ref 0.44–1.00)
GFR calc Af Amer: >60 mL/min (ref >60)
GFR, EST NON AFRICAN AMERICAN: 59 mL/min — AB (ref >60)
GLUCOSE: 88 mg/dL (ref 70–99)
Potassium: 3.3 mmol/L — ABNORMAL LOW (ref 3.5–5.1)
Sodium: 145 mmol/L (ref 135–145)

## 2018-04-25 LAB — CBC
HCT: 36.8 % (ref 36.0–46.0)
Hemoglobin: 12.1 g/dL (ref 12.0–15.0)
MCH: 30.9 pg (ref 26.0–34.0)
MCHC: 32.9 g/dL (ref 30.0–36.0)
MCV: 94.1 fL (ref 78.0–100.0)
PLATELETS: 351 10*3/uL (ref 150–400)
RBC: 3.91 MIL/uL (ref 3.87–5.11)
RDW: 13.2 % (ref 11.5–15.5)
WBC: 11 10*3/uL — ABNORMAL HIGH (ref 4.0–10.5)

## 2018-04-25 MED ORDER — ACETAMINOPHEN 325 MG PO TABS: 650.0000 mg | ORAL_TABLET | Freq: Four times a day (QID) | ORAL | Status: DC | PRN

## 2018-04-25 MED ORDER — LORAZEPAM 0.5 MG PO TABS: 0.5000 mg | ORAL_TABLET | ORAL | Status: DC | PRN

## 2018-04-25 NOTE — Progress Notes (Signed)
LCSW consulted for disposition. Patient oriented to person only.  [facility] LCSW attempted to reach patients daughter Annice PihJackie to complete assessment.   PT recommend return to facility with out patient PT.   LCSW will continue to follow for disposition.   Beulah GandyBernette Noam Harris, LSCW [facility] CSW [phone]

## 2018-04-25 NOTE — Progress Notes (Signed)
Modified Barium Swallow Progress Note  Patient Details  Name: Tami Harris MRN: [id number] Date of Birth: 19-Apr-1924  Today's Date: 04/25/2018  Modified Barium Swallow completed.  Full report located under Chart Review in the Imaging Section.  Brief recommendations include the following:  Clinical Impression  Pt presents with mild oral dysphagia consistent with dysphagia and dementia.  She did not aspirate or penetrate any consistency.  Decreased oral control did result in premature spillage of small amount of thin to pyriform sinus x1.  Barium tablet given with thin precariously lodged at vallecular region without pt awareness.  Pt required pudding to clear into esophagus.  Pt did fortunately cough/throat clear without barium visualized in larynx/trachea with cough.  Daughter present and educated to findings of MBS as well as mitigation strategies.  Pt did not aspirate on MBS but is at risk due to dysphagia, dementia.  Will follow up briefly for education.  Daughter reports mother is "fearful" of eating but SLP did not observe behavior during MBS that resembled this feeling.     Swallow Evaluation Recommendations       SLP Diet Recommendations: Dysphagia 3 (Mech soft) solids;Thin liquid   Liquid Administration via: Cup;Straw   Medication Administration: Whole meds with puree(start and follow with pudding)   Supervision: Intermittent supervision to cue for compensatory strategies   Compensations: Minimize environmental distractions;Slow rate;Small sips/bites   Postural Changes: Seated upright at 90 degrees;Remain semi-upright after after feeds/meals (Comment)   Oral Care Recommendations: Oral care BID       Donavan Burnetamara Pepe Mineau, MS [facility] S[facility] [phone]  Chales AbrahamsKimball, Edyn Qazi Ann 04/25/2018,12:21 PM

## 2018-04-25 NOTE — Progress Notes (Signed)
PROGRESS NOTE  Tami Harris TDS:[id number] DOB: Feb 09, 1924 DOA: 04/23/2018 PCP: Merlene Laughter, MD  HPI/Recap of past 60 hours: 84 year old female with past medical history significant for Alzheimer's dementia, PVD, hip fracture status post repair, GI bleed, hypertension, macular degeneration, admitted on 04/23/2018 with cough, and UA suggestive of UTI.  Been admitted for further management.  Today, met patient sleeping, easily arousable. Denies any new complaints.  Assessment/Plan: Principal Problem:   CAP (community acquired pneumonia) Active Problems:   Dementia   Acute lower UTI   Advanced care planning/counseling discussion  Possible UTI Afebrile, with resolving leukocytosis UA suggestive of UTI, moderate leukocytes, many bacteria, 11-20 WBC UC >100,000 E faecalis BC x2, NGTD Continue IV Rocephin, until susceptibilities  Dysphasia SLP on board, MBS showed mild oral dysphasia, with risk of aspiration due to dysphagia and dementia Continue dysphasia 3 solids, thin liquids Aspiration precautions  Dementia Continue Aricept  Hypertension Continue lisinopril  GOC Palliative care on board, may benefit from hospice if significant aspiration is noted on MBS CSW on board, plan to discharge back to [facility], if palliative does not recommend hospice      Code Status: DNR  Family Communication: None at bedside  Disposition Plan: Back to SNF versus hospice   Consultants:  Palliative team  Procedures:  None  Antimicrobials:  Ceftriaxone  DVT prophylaxis: Lovenox   Objective: Vitals:   04/24/18 1417 04/24/18 1952 04/25/18 0438 04/25/18 1338  BP: 129/66 (!) 150/66 (!) 157/66 (!) 141/68  Pulse: 63 69 68 66  Resp: 20 (!) 26 (!) 28 (!) 24  Temp: 98.1 F (36.7 C) 98.1 F (36.7 C) 98.1 F (36.7 C) 97.7 F (36.5 C)  TempSrc: Oral  Oral Oral  SpO2: 97% 95% 94% 97%  Weight:      Height:        Intake/Output Summary (Last 24 hours) at  04/25/2018 1457 Last data filed at 04/24/2018 1700 Gross per 24 hour  Intake 13.33 ml  Output -  Net 13.33 ml   Filed Weights   04/23/18 2200  Weight: 65.4 kg (144 lb 2.9 oz)    Exam:   General: NAD, chronically ill-appearing  Cardiovascular: S1, S2 present  Respiratory: CTA B  Abdomen: Soft, nontender, nondistended, bowel sounds present  Musculoskeletal: No pedal edema bilaterally  Skin: Normal  Psychiatry: Unable to assess   Data Reviewed: CBC: Recent Labs  Lab 04/23/18 1601 04/25/18 0559  WBC 14.8* 11.0*  NEUTROABS 11.6*  --   HGB 13.9 12.1  HCT 41.3 36.8  MCV 93.7 94.1  PLT 353 262   Basic Metabolic Panel: Recent Labs  Lab 04/23/18 1601 04/25/18 0559  NA 144 145  K 3.7 3.3*  CL 107 109  CO2 24 27  GLUCOSE 104* 88  BUN 12 9  CREATININE 0.77 0.83  CALCIUM 8.8* 8.7*   GFR: Estimated Creatinine Clearance: 37.3 mL/min (by C-G formula based on SCr of 0.83 mg/dL). Liver Function Tests: Recent Labs  Lab 04/23/18 1601  AST 24  ALT 19  ALKPHOS 81  BILITOT 0.7  PROT 6.5  ALBUMIN 2.8*   No results for input(s): LIPASE, AMYLASE in the last 168 hours. No results for input(s): AMMONIA in the last 168 hours. Coagulation Profile: No results for input(s): INR, PROTIME in the last 168 hours. Cardiac Enzymes: No results for input(s): CKTOTAL, CKMB, CKMBINDEX, TROPONINI in the last 168 hours. BNP (last 3 results) No results for input(s): PROBNP in the last 8760 hours. HbA1C: No results  for input(s): HGBA1C in the last 72 hours. CBG: No results for input(s): GLUCAP in the last 168 hours. Lipid Profile: No results for input(s): CHOL, HDL, LDLCALC, TRIG, CHOLHDL, LDLDIRECT in the last 72 hours. Thyroid Function Tests: Recent Labs    04/24/18 0516  TSH 0.482   Anemia Panel: No results for input(s): VITAMINB12, FOLATE, FERRITIN, TIBC, IRON, RETICCTPCT in the last 72 hours. Urine analysis:    Component Value Date/Time   COLORURINE AMBER (A)  04/23/2018 1823   APPEARANCEUR HAZY (A) 04/23/2018 1823   LABSPEC 1.026 04/23/2018 1823   PHURINE 5.0 04/23/2018 1823   GLUCOSEU NEGATIVE 04/23/2018 1823   HGBUR NEGATIVE 04/23/2018 1823   BILIRUBINUR NEGATIVE 04/23/2018 1823   KETONESUR NEGATIVE 04/23/2018 1823   PROTEINUR NEGATIVE 04/23/2018 1823   UROBILINOGEN 0.2 06/15/2011 1033   NITRITE NEGATIVE 04/23/2018 1823   LEUKOCYTESUR MODERATE (A) 04/23/2018 1823   Sepsis Labs: _0 (procalcitonin:4,lacticidven:4)  ) Recent Results (from the past 240 hour(s))  Urine culture     Status: Abnormal (Preliminary result)   Collection Time: 04/23/18  6:23 PM  Result Value Ref Range Status   Specimen Description   Final    URINE, CLEAN CATCH Performed at [facility], [place] [address], [place], [place] [address]    Special Requests   Final    NONE Performed at [facility], [place] [address], [place], [place] [address]    Culture >=100,000 COLONIES/mL ENTEROCOCCUS FAECALIS (A)  Final   Report Status PENDING  Incomplete  Culture, blood (routine x 2) Call MD if unable to obtain prior to antibiotics being given     Status: None (Preliminary result)   Collection Time: 04/23/18  8:10 PM  Result Value Ref Range Status   Specimen Description   Final    BLOOD LEFT ANTECUBITAL Performed at [facility] [place] [address] [place], [place] [address]    Special Requests   Final    BOTTLES DRAWN AEROBIC AND ANAEROBIC Blood Culture adequate volume Performed at [place] [address] [place], [place] [address]    Culture   Final    NO GROWTH 2 DAYS Performed at [facility] [address] [place], [place] [address]    Report Status PENDING  Incomplete  Culture, blood (routine x 2) Call MD if unable to obtain prior to antibiotics being given     Status: None (Preliminary result)   Collection Time: 04/23/18  9:05 PM  Result Value Ref Range Status   Specimen  Description   Final    BLOOD BLOOD RIGHT HAND Performed at [facility] [address] [place], [place] [address]    Special Requests   Final    BOTTLES DRAWN AEROBIC AND ANAEROBIC Blood Culture adequate volume Performed at [facility] [address] [place], [place] [address]    Culture   Final    NO GROWTH 2 DAYS Performed at [place] [address] [place], [place] [address]    Report Status PENDING  Incomplete  MRSA PCR Screening     Status: Abnormal   Collection Time: 04/24/18 12:30 AM  Result Value Ref Range Status   MRSA by PCR POSITIVE (A) NEGATIVE Final    Comment:        The GeneXpert MRSA Assay (FDA approved for NASAL specimens only), is one component of a comprehensive MRSA colonization surveillance program. It is not intended to diagnose MRSA infection nor  to guide or monitor treatment for MRSA infections. RESULT CALLED TO, READ BACK BY AND VERIFIED WITH: D HILL,RN _0  04/24/18 MKELLY Performed at [facility], [place] [address] [place], [place] [address]       Studies: Dg Swallowing Func-speech Pathology  Result Date: 04/25/2018 Objective Swallowing Evaluation: Type of Study: MBS-Modified Barium Swallow Study  Patient Details Name: Tami Harris MRN: [id number] Date of Birth: 1924/08/31 Today's Date: 04/25/2018 Time: SLP Start Time (ACUTE ONLY): 1108 -SLP Stop Time (ACUTE ONLY): 1142 SLP Time Calculation (min) (ACUTE ONLY): 34 min Past Medical History: Past Medical History: Diagnosis Date . Age-related macular degeneration, wet, left eye ([place])   "partially blind in that eye" . Allergic rhinitis  . Alzheimer's dementia dx'd 2008 . Diverticulosis  . Fall  . Family history of adverse reaction to anesthesia   daughter reports "either faulty tube crimped or swelling from allergic reaction caused the tube to crimp" . GERD (gastroesophageal reflux disease)  . Headache   "fragrant related" . Hiatal  hernia  . Memory loss  . Pneumonia 2013 . Radial fracture  . Seasonal allergies  Past Surgical History: Past Surgical History: Procedure Laterality Date . BLADDER SUSPENSION  X 2 . CATARACT EXTRACTION W/ INTRAOCULAR LENS IMPLANT Right  . DILATION AND CURETTAGE OF UTERUS   . FEMUR IM NAIL Right 06/17/2016  Procedure: INTRAMEDULLARY (IM) NAIL FEMORAL;  Surgeon: Renette Butters, MD;  Location: [facility];  Service: Orthopedics;  Laterality: Right; . INGUINAL HERNIA REPAIR Right  . PERIPHERAL VASCULAR CATHETERIZATION N/A 05/16/2016  Procedure: Lower Extremity Angiography;  Surgeon: Lorretta Harp, MD;  Location: [facility];  Service: Cardiovascular;  Laterality: N/A; HPI: Pt is a 84 y.o.female, with a history ofAlzheimer's, GERD, HH (moderate to large per CXR on admission), PNA, and HTN, presenting to the ED with several days of coughing. CXR without definiate airsapce disease.  Subjective: pt alert, needs cues for initiation Assessment / Plan / Recommendation CHL IP CLINICAL IMPRESSIONS 04/25/2018 Clinical Impression Pt presents with mild oral dysphagia consistent with dysphagia and dementia.  She did not aspirate or penetrate any consistency.  Decreased oral control did result in premature spillage of small amount of thin to pyriform sinus x1.  Barium tablet given with thin precariously lodged at vallecular region without pt awareness.  Pt required pudding to clear into esophagus.  Pt did fortunately cough/throat clear without barium visualized in larynx/trachea with cough.  Daughter present and educated to findings of MBS as well as mitigation strategies.  Pt did not aspirate on MBS but is at risk due to dysphagia, dementia.  Will follow up briefly for education.  Daughter reports mother is "fearful" of eating but SLP did not observe behavior during MBS that resembled this feeling.   SLP Visit Diagnosis Dysphagia, oral phase (R13.11) Attention and concentration deficit following -- Frontal lobe and executive  function deficit following -- Impact on safety and function Mild aspiration risk   CHL IP TREATMENT RECOMMENDATION 04/25/2018 Treatment Recommendations Therapy as outlined in treatment plan below   Prognosis 04/25/2018 Prognosis for Safe Diet Advancement Fair Barriers to Reach Goals Cognitive deficits Barriers/Prognosis Comment -- CHL IP DIET RECOMMENDATION 04/25/2018 SLP Diet Recommendations Dysphagia 3 (Mech soft) solids;Thin liquid Liquid Administration via Cup;Straw Medication Administration Whole meds with puree Compensations Minimize environmental distractions;Slow rate;Small sips/bites Postural Changes Seated upright at 90 degrees;Remain semi-upright after after feeds/meals (Comment)   CHL IP OTHER RECOMMENDATIONS 04/25/2018 Recommended Consults -- Oral Care Recommendations Oral care BID Other Recommendations --  CHL IP FOLLOW UP RECOMMENDATIONS 04/25/2018 Follow up Recommendations (No Data)   CHL IP FREQUENCY AND DURATION 04/25/2018 Speech Therapy Frequency (ACUTE ONLY) min 2x/week Treatment Duration 1 week      CHL IP ORAL PHASE 04/25/2018 Oral Phase Impaired Oral - Pudding Teaspoon -- Oral - Pudding Cup -- Oral - Honey Teaspoon -- Oral - Honey Cup -- Oral - Nectar Teaspoon -- Oral - Nectar Cup Delayed oral transit Oral - Nectar Straw Delayed oral transit Oral - Thin Teaspoon Delayed oral transit Oral - Thin Cup Delayed oral transit;Premature spillage Oral - Thin Straw Delayed oral transit;Premature spillage Oral - Puree Delayed oral transit Oral - Mech Soft -- Oral - Regular Delayed oral transit;Impaired mastication Oral - Multi-Consistency -- Oral - Pill Delayed oral transit Oral Phase - Comment --  CHL IP PHARYNGEAL PHASE 04/25/2018 Pharyngeal Phase Impaired Pharyngeal- Pudding Teaspoon -- Pharyngeal -- Pharyngeal- Pudding Cup -- Pharyngeal -- Pharyngeal- Honey Teaspoon -- Pharyngeal -- Pharyngeal- Honey Cup -- Pharyngeal -- Pharyngeal- Nectar Teaspoon -- Pharyngeal -- Pharyngeal- Nectar Cup WFL Pharyngeal --  Pharyngeal- Nectar Straw WFL Pharyngeal -- Pharyngeal- Thin Teaspoon WFL Pharyngeal -- Pharyngeal- Thin Cup Delayed swallow initiation-pyriform sinuses Pharyngeal -- Pharyngeal- Thin Straw WFL Pharyngeal -- Pharyngeal- Puree WFL;Delayed swallow initiation-vallecula;Pharyngeal residue - valleculae;Reduced tongue base retraction Pharyngeal -- Pharyngeal- Mechanical Soft -- Pharyngeal -- Pharyngeal- Regular WFL;Delayed swallow initiation-vallecula;Pharyngeal residue - valleculae;Reduced tongue base retraction Pharyngeal -- Pharyngeal- Multi-consistency -- Pharyngeal -- Pharyngeal- Pill Delayed swallow initiation-vallecula;Pharyngeal residue - valleculae Pharyngeal -- Pharyngeal Comment pt did cough and throat clear with no barium in larynx, barium tablet lodged at vallecular region without awareness/sensation, further liquid boluses did not clear it and pt eventually required pudding to transit into esophagus, upon esoph sweep- barium tablet momentarily lodged in esophagus  CHL IP CERVICAL ESOPHAGEAL PHASE 04/25/2018 Cervical Esophageal Phase WFL Pudding Teaspoon -- Pudding Cup -- Honey Teaspoon -- Honey Cup -- Nectar Teaspoon -- Nectar Cup -- Nectar Straw -- Thin Teaspoon -- Thin Cup -- Thin Straw -- Puree -- Mechanical Soft -- Regular -- Multi-consistency -- Pill -- Cervical Esophageal Comment -- Macario Golds 04/25/2018, 12:21 PM  Luanna Salk, MS [facility] SLP [phone]              Scheduled Meds: . aspirin EC  81 mg Oral Daily  . Chlorhexidine Gluconate Cloth  6 each Topical Q0600  . docusate sodium  100 mg Oral BID  . donepezil  10 mg Oral QHS  . enoxaparin (LOVENOX) injection  40 mg Subcutaneous QHS  . ferrous sulfate  325 mg Oral BID WC  . lisinopril  10 mg Oral Daily  . multivitamin with minerals  1 tablet Oral Daily  . mupirocin ointment  1 application Nasal BID  . omega-3 acid ethyl esters  1 g Oral BID  . pantoprazole  40 mg Oral BID  . potassium chloride SA  20 mEq Oral Daily  . sucralfate  1  g Oral TID AC & HS    Continuous Infusions: . sodium chloride    . cefTRIAXone (ROCEPHIN)  IV 1 g (04/24/18 1656)     LOS: 2 days     Alma Friendly, MD [facility]  If 7PM-7AM, please contact night-coverage [email or web address] Password [email or web address] 04/25/2018, 2:57 PM

## 2018-04-25 NOTE — Clinical Social Work Note (Signed)
Clinical Social Work Assessment  Patient Details  Name: Tami Harris MRN: [id number] Date of Birth: 02/13/1924  Date of referral:  04/25/18               Reason for consult:  Discharge Planning, Facility Placement                Permission sought to share information with:  Case Manager, Customer service manager, Family Supports Permission granted to share information::  No(Patient not oriented)  Name::     Environmental health practitioner::  [facility] Age  Relationship::  daughter  Sport and exercise psychologist Information:     Housing/Transportation Living arrangements for the past 2 months:  [place] of Information:  Adult Children Patient Interpreter Needed:  None Criminal Activity/Legal Involvement Pertinent to Current Situation/Hospitalization:  No - Comment as needed Significant Relationships:  Adult Children, Warehouse manager, Other Family Members Lives with:  Facility Resident Do you feel safe going back to the place where you live?  Yes Need for family participation in patient care:  Yes (Comment)  Care giving concerns:  No care giving concerns at the time of assessment.   Social Worker assessment / plan:  LCSW consulted for disposition.   Patient admitted for community acquired pneumonia.  LCSW spoke with patient's daughter Tami Harris.   According to [facility] patient uses a walker and wheel chair at [facility]. Patient is able to transfer alone at baseline. Patient is able to use walker to ambulate to [employer]. PT currently recommending out patient therapy. Patients diet has changed to dysphasia 3 with supervision.   Tami Harris is agreeable to return to facility. Palliative has met with patient and daughter agreeable to full comfort measures. LCSW left message for return call at the facility to confirm return. Patient's daughter is willing to provide extra care/supervision for patient if needed.   PLAN: Patient will return to facility with hospice.   Employment status:   Retired Nurse, adult PT Recommendations:  Outpatient Therapies Information / Referral to community resources:     Patient/Family's Response to care:  Patient's daughter Tami Harris is thankful for LCSW visit and coordination with care.   Patient/Family's Understanding of and Emotional Response to Diagnosis, Current Treatment, and Prognosis:  Daughter is realistic about patient's condition and prognosis. Tami Harris is agreeable to full comfort care and patient will return to facility with hospice following.   Emotional Assessment Appearance:  Appears stated age Attitude/Demeanor/Rapport:    Affect (typically observed):  Calm Orientation:  Oriented to Self Alcohol / Substance use:  Not Applicable Psych involvement (Current and /or in the community):  No (Comment)  Discharge Needs  Concerns to be addressed:  No discharge needs identified Readmission within the last 30 days:    Current discharge risk:  None Barriers to Discharge:  Continued Medical Work up   [employer], LCSW 04/25/2018, 3:59 PM

## 2018-04-25 NOTE — Evaluation (Signed)
Physical Therapy Evaluation Patient Details Name: Tami Harris MRN: [id number] DOB: [birth year] Today's Date: 04/25/2018   History of Present Illness  84 YO female admitted from [facility] with CAP and UTI. PMH includes Alzheimer's dementia since 2008, L partial eye blindness secondary to macular degeneration, HTN, falls, radial fx. Surgical history includes peripheral vascular catheterization in 2017, inguinal hernia repair, intramedullary femoral nailing in 2017.    Clinical Impression  Pt is a 84 YO female admitted 8/5 with CAP and UTI. PMH as noted above. Pt presents with decreased endurance for ambulation, generalized UE and LE weakness, some difficulty with all mobility skills, and difficulty with verbal cuing due to dementia. Pt ambulated 40 feet with RW +2 for safety today, responding best to tactile corrections and PT steadying RW as opposed to verbal cuing. Pt with decreased safety awareness present throughout session. Pt would benefit from acute PT to address deficits found on eval. Family is supportive and at bedside after session. Will continue to follow acutely.     Follow Up Recommendations Outpatient PT;Supervision for mobility/OOB([facility])    Equipment Recommendations  None recommended by PT    Recommendations for Other Services       Precautions / Restrictions Precautions Precautions: Fall Restrictions Weight Bearing Restrictions: No      Mobility  Bed Mobility Overal bed mobility: Needs Assistance Bed Mobility: Supine to Sit;Sit to Supine     Supine to sit: Min assist Sit to supine: Min assist   General bed mobility comments: Min assist for cuing to EOB, LE management and scooting. Pt does well with tactile cuing as opposed to verbal cuing.   Transfers Overall transfer level: Needs assistance Equipment used: Rolling walker (2 wheeled) Transfers: Sit to/from Stand Sit to Stand: Min assist;+2 safety/equipment         General  transfer comment: Min assist for power up and steadying upon standing. Pt initiated standing when RW was placed in front of her.   Ambulation/Gait Ambulation/Gait assistance: Min assist;+2 safety/equipment Gait Distance (Feet): 40 Feet Assistive device: Rolling walker (2 wheeled) Gait Pattern/deviations: Step-to pattern;Trunk flexed;Drifts right/left;Decreased stride length Gait velocity: decreased    General Gait Details: Min assist for steadying and steering RW. Pt kept RW extended far in front of her, mod verbal cuing to step into RW. Verbal cuing was ineffective, so PT held RW to steady it and keep it from moving too far forward.  Stairs            Wheelchair Mobility    Modified Rankin (Stroke Patients Only)       Balance Overall balance assessment: Needs assistance Sitting-balance support: Bilateral upper extremity supported;Feet supported Sitting balance-Leahy Scale: Poor Sitting balance - Comments: When hands were removed from bed, pt leaned posteriorly and feet left floor.    Standing balance support: Bilateral upper extremity supported Standing balance-Leahy Scale: Poor Standing balance comment: relied on RW for UE support                              Pertinent Vitals/Pain Pain Assessment: No/denies pain    Home Living Family/patient expects to be discharged to:: Assisted living([facility])               Home Equipment: Other (comment) Additional Comments: Daughter and son-in-law present after session. Daughter states pt uses RW for some mobility, but mostly uses the wheelchair. Daughter states [facility] have  been encouraging her to walk.     Prior Function Level of [facility]: Needs assistance   Gait / Transfers Assistance Needed: Per daughter, pt uses RW for short distances and transfers and wheelchair for a majority of mobility.   ADL's / Homemaking Assistance Needed: assist provided         Hand Dominance         Extremity/Trunk Assessment   Upper Extremity Assessment Upper Extremity Assessment: Generalized weakness    Lower Extremity Assessment Lower Extremity Assessment: Generalized weakness    Cervical / Trunk Assessment Cervical / Trunk Assessment: Kyphotic  Communication   Communication: HOH  Cognition Arousal/Alertness: Lethargic Behavior During Therapy: Flat affect;WFL for tasks assessed/performed Overall Cognitive Status: History of cognitive impairments - at baseline                                 General Comments: Diagnosed with Alzheimer's dementia in 2008. When asked where she was, pt stated she was in the hospital, but stated she did not have children when she has a daughter that has been with her in room today      General Comments      Exercises     Assessment/Plan    PT Assessment Patient needs continued PT services  PT Problem List Decreased strength;Decreased mobility;Decreased safety awareness;Decreased activity tolerance;Decreased knowledge of use of DME       PT Treatment Interventions DME instruction;Therapeutic activities;Gait training;Therapeutic exercise;Patient/family education;Balance training    PT Goals (Current goals can be found in the Care Plan section)  Acute Rehab PT Goals PT Goal Formulation: With patient Time For Goal Achievement: 05/09/18 Potential to Achieve Goals: Good    Frequency Min 2X/week   Barriers to discharge        Co-evaluation               AM-PAC PT "6 Clicks" Daily Activity  Outcome Measure Difficulty turning over in bed (including adjusting bedclothes, sheets and blankets)?: A Little Difficulty moving from lying on back to sitting on the side of the bed? : Unable Difficulty sitting down on and standing up from a chair with arms (e.g., wheelchair, bedside commode, etc,.)?: Unable Help needed moving to and from a bed to chair (including a wheelchair)?: A Little Help needed walking in hospital  room?: A Lot Help needed climbing 3-5 steps with a railing? : Total 6 Click Score: 11    End of Session Equipment Utilized During Treatment: Gait belt;Oxygen Activity Tolerance: Patient tolerated treatment well;Patient limited by fatigue Patient left: in bed;with bed alarm set;with call bell/phone within reach Nurse Communication: Mobility status PT Visit Diagnosis: Difficulty in walking, not elsewhere classified (R26.2);Other abnormalities of gait and mobility (R26.89)    Time: 9147-8295 PT Time Calculation (min) (ACUTE ONLY): 22 min   Charges:   PT Evaluation $PT Eval Low Complexity: 1 Low          Tishana Clinkenbeard Terrial Rhodes, PT, DPT  Pager # [phone]    Daje Stark D Caily Rakers 04/25/2018, 3:56 PM

## 2018-04-25 NOTE — Progress Notes (Signed)
Daily Progress Note   Patient Name: Tami Harris       Date: 04/25/2018 DOB: 05-17-1924  Age: 84 y.o. MRN#: [id number] Attending Physician: Alma Friendly, MD Primary Care Physician: Merlene Laughter, MD Admit Date: 04/23/2018  Reason for Consultation/Follow-up: Establishing goals of care  Subjective: Met again with patient's daughter for further [place].  Noted outcome of SLP eval showing dysphagia due to dementia. Also noted that patient needed constant cueing for each bite. Although patient did not aspirate, the fact that she did not self initiate feeding shows advancement of her dementia as well there is risk for continued aspiration due to her dysphagia and dementia. Patient has not improved clinically during admission and with receiving antibiotics. Kennyth Lose states she feels she is spending time "in and out of worlds". She is sleeping more than she is awake. Kennyth Lose notes she had been doing this prior to admission but even more now.  Reviewed urine culture with Kennyth Lose- noted E. Faecalis positive and possibly could have change in antibiotic for urinary infection and could have some improvement.   Kennyth Lose feel that her Mom would want "to just be let go". She states that her when her Mom wakes up she looks at her and Kennyth Lose feels she is trying to tell her- "Just let me go".  Kennyth Lose would like to transition her Mom to full comfort care and is certain this would be her Mother's [place] as well. Stop antibiotics. No more labs.  If [facility] is able to take her back with Hospice that would be her preference. She states that her Mom would prefer to die in her bed there than at a residential Hospice facility.   Review of Systems  Unable to perform ROS: Dementia  All other systems reviewed and are  negative.   Length of Stay: 2  Current Medications: Scheduled Meds:  . aspirin EC  81 mg Oral Daily  . Chlorhexidine Gluconate Cloth  6 each Topical Q0600  . docusate sodium  100 mg Oral BID  . donepezil  10 mg Oral QHS  . enoxaparin (LOVENOX) injection  40 mg Subcutaneous QHS  . ferrous sulfate  325 mg Oral BID WC  . lisinopril  10 mg Oral Daily  . multivitamin with minerals  1 tablet Oral Daily  . mupirocin ointment  1  application Nasal BID  . omega-3 acid ethyl esters  1 g Oral BID  . pantoprazole  40 mg Oral BID  . potassium chloride SA  20 mEq Oral Daily  . sucralfate  1 g Oral TID AC & HS    Continuous Infusions: . sodium chloride    . cefTRIAXone (ROCEPHIN)  IV 1 g (04/24/18 1656)    PRN Meds: sodium chloride, bisacodyl, guaiFENesin-dextromethorphan  Physical Exam  Constitutional: She appears well-developed.  Frail, ill appearing  Cardiovascular: Normal rate.  Pulmonary/Chest:  Cough, nonproductive  Abdominal: Soft.  Neurological:  lethargic  Skin: Skin is warm and dry. There is pallor.  Psychiatric:  Flat affect  Nursing note and vitals reviewed.           Vital Signs: BP (!) 141/68 (BP Location: Left Arm)   Pulse 66   Temp 97.7 F (36.5 C) (Oral)   Resp (!) 24   Ht _0  (1.626 m)   Wt 65.4 kg (144 lb 2.9 oz)   SpO2 97%   BMI 24.75 kg/m  SpO2: SpO2: 97 % O2 Device: O2 Device: Nasal Cannula O2 Flow Rate: O2 Flow Rate (L/min): 2 L/min  Intake/output summary:   Intake/Output Summary (Last 24 hours) at 04/25/2018 1505 Last data filed at 04/24/2018 1700 Gross per 24 hour  Intake 13.33 ml  Output -  Net 13.33 ml   LBM: Last BM Date: 04/23/18 Baseline Weight: Weight: 65.4 kg (144 lb 2.9 oz) Most recent weight: Weight: 65.4 kg (144 lb 2.9 oz)       Palliative Assessment/Data: PPS: 30%      Patient Active Problem List   Diagnosis Date Noted  . Advanced care planning/counseling discussion   . CAP (community acquired pneumonia) 04/23/2018    . Acute lower UTI 04/23/2018  . Altered mental status 06/25/2017  . Hypertension 06/25/2017  . GI bleed 03/28/2017  . Acute GI bleeding 03/27/2017  . Hiatal hernia with GERD and esophagitis 03/27/2017  . Leucocytosis 03/27/2017  . Constipation 03/27/2017  . Intractable nausea and vomiting 03/27/2017  . Palliative care encounter   . Closed right hip fracture ([place]) 06/16/2016  . Hip fracture ([place]) 06/16/2016  . Goals of care, counseling/discussion   . Advance care planning   . Palliative care by specialist   . Fall 04/18/2016  . Distal radius fracture 04/18/2016  . Syncope 04/18/2016  . PVD (peripheral vascular disease) ([place])   . Critical lower limb ischemia 04/08/2016  . 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 Pneumonia 10/18/2011  . Dementia 10/18/2011    Palliative Care Assessment & Plan   Patient Profile: 84 y.o. female  with past medical history of Alzheimer's dementia, PVD, hip fracture with pin repair, GI bleed, HTN, macular degeneration, diverticulosis, hiatal hernia admitted on 04/23/2018 with cough x 3 days, hypoxia, hematuria. She is being treated for UTI and pneumonia. Palliative medicine consulted for "decreased will to live".   Assessment/Recommendations/Plan   Transition to full comfort measures  Morphine solution 32m sublingual q2hr for SOB   D/C to ALF with Hospice  Goals of Care and Additional Recommendations:  Limitations on Scope of Treatment: Full Comfort Care  Code Status:  DNR  Prognosis:   < 4 weeks d/t pneumonia, E faecilis UTI, transition to comfort care, dysphagia, limited po intake  [place]  [facility]  Care plan was discussed with patient's daughter- JKennyth Lose Thank you for allowing the Palliative Medicine Team to assist in the care of this patient.  Time In: 1430 Time Out: 1530 Total Time 60 mins Prolonged Time Billed yes      Greater than 50%  of this time was spent counseling and  coordinating care related to the above assessment and plan.  Mariana Kaufman, AGNP-C Palliative Medicine   Please contact Palliative Medicine Team phone at [phone] for questions and concerns.

## 2018-04-26 DIAGNOSIS — R1310 Dysphagia, unspecified: Secondary | ICD-10-CM

## 2018-04-26 LAB — URINE CULTURE: Culture: >=100000 — AB

## 2018-04-26 MED ORDER — SUCRALFATE 1 G PO TABS: 1.0000 g | ORAL_TABLET | Freq: Three times a day (TID) | ORAL | Status: AC

## 2018-04-26 MED ORDER — ACETAMINOPHEN 325 MG PO TABS: 650.0000 mg | ORAL_TABLET | Freq: Four times a day (QID) | ORAL | Status: DC | PRN

## 2018-04-26 MED ORDER — FOSFOMYCIN TROMETHAMINE 3 G PO PACK
3.0000 g | PACK | Freq: Once | ORAL | Status: AC
  Administered 2018-04-26: 3 g via ORAL
  Filled 2018-04-26: qty 3

## 2018-04-26 MED ORDER — LORAZEPAM 0.5 MG PO TABS: 0.5000 mg | ORAL_TABLET | ORAL | 0 refills | Status: DC | PRN

## 2018-04-26 NOTE — [place] FL2 (Signed)
[place] MEDICAID FL2 LEVEL OF CARE SCREENING TOOL     IDENTIFICATION  Patient Name: KAMMI HECHLER Birthdate: April 04, 1924 Sex: female Admission Date (Current Location): 04/23/2018  [facility] and [place] Number:  Producer, television/film/video and Address:  [facility],  [address], [place] [address]      Provider Number: [id number]  Attending Physician Name and Address:  Briant Cedar, MD  Relative Name and Phone Number:       Current Level of Care: Hospital Recommended Level of Care: Assisted Living Facility Prior Approval Number:    Date Approved/Denied:   PASRR Number:    Discharge Plan: Other (Comment)(ALF)    Current Diagnoses: Patient Active Problem List   Diagnosis Date Noted  . Advanced care planning/counseling discussion   . CAP (community acquired pneumonia) 04/23/2018  . Acute lower UTI 04/23/2018  . Altered mental status 06/25/2017  . Hypertension 06/25/2017  . GI bleed 03/28/2017  . Acute GI bleeding 03/27/2017  . Hiatal hernia with GERD and esophagitis 03/27/2017  . Leucocytosis 03/27/2017  . Constipation 03/27/2017  . Intractable nausea and vomiting 03/27/2017  . Palliative care encounter   . Closed right hip fracture ([facility]) 06/16/2016  . Hip fracture ([facility]) 06/16/2016  . Goals of care, counseling/discussion   . Advance care planning   . Palliative care by specialist   . Fall 04/18/2016  . Distal radius fracture 04/18/2016  . Syncope 04/18/2016  . PVD (peripheral vascular disease) ([facility])   . Critical lower limb ischemia 04/08/2016  . 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 Pneumonia 10/18/2011  . Dementia 10/18/2011    Orientation RESPIRATION BLADDER Height & Weight     Self    Continent, External catheter Weight: 144 lb 2.9 oz (65.4 kg) Height:  5\' 4"  (162.6 cm)  BEHAVIORAL SYMPTOMS/MOOD NEUROLOGICAL BOWEL NUTRITION STATUS      Continent Diet(Dysphasia 3)  AMBULATORY STATUS COMMUNICATION OF NEEDS Skin   Extensive Assist  Verbally Normal                       Personal Care Assistance Level of Assistance  Bathing, Feeding, Dressing Bathing Assistance: Limited assistance Feeding assistance: Limited assistance Dressing Assistance: Limited assistance     Functional Limitations Info  Hearing, Speech, Sight Sight Info: Adequate Hearing Info: Adequate Speech Info: Adequate    SPECIAL CARE FACTORS FREQUENCY                       Contractures Contractures Info: Not present    Additional Factors Info  Code Status, Allergies Code Status Info: DNR Allergies Info: Sulfa Antibiotics, Tape           Current Medications (04/26/2018):  This is the current hospital active medication list Current Facility-Administered Medications  Medication Dose Route Frequency Provider Last Rate Last Dose  . 0.9 %  sodium chloride infusion   Intravenous PRN Emokpae, Courage, MD      . acetaminophen (TYLENOL) tablet 650 mg  650 mg Oral Q6H PRN Barbara Cower, NP      . Chlorhexidine Gluconate Cloth 2 % PADS 6 each  6 each Topical Q0600 Hillary Bow, DO   Stopped at 04/26/18 0840  . docusate sodium (COLACE) capsule 100 mg  100 mg Oral BID Lyda Perone M, DO   100 mg at 04/26/18 0850  . donepezil (ARICEPT) tablet 10 mg  10 mg Oral QHS Lyda Perone M, DO   10 mg at 04/25/18  2137  . guaiFENesin-dextromethorphan (ROBITUSSIN DM) 100-10 MG/5ML syrup 5 mL  5 mL Oral Q4H PRN Hillary BowGardner, Jared M, DO   5 mL at 04/24/18 0818  . LORazepam (ATIVAN) tablet 0.5 mg  0.5 mg Oral Q4H PRN Barbara CowerMahan, Kasie J, NP      . mupirocin ointment (BACTROBAN) 2 % 1 application  1 application Nasal BID Hillary BowGardner, Jared M, DO   1 application at 04/26/18 0850  . pantoprazole (PROTONIX) EC tablet 40 mg  40 mg Oral BID Lyda PeroneGardner, Jared M, DO   40 mg at 04/26/18 0850  . sucralfate (CARAFATE) tablet 1 g  1 g Oral TID AC & HS Hillary BowGardner, Jared M, DO   1 g at 04/26/18 1154     Discharge Medications: STOP taking these medications   aspirin EC 81 MG  tablet   docusate sodium 100 MG capsule Commonly known as:  COLACE   ferrous sulfate 325 (65 FE) MG tablet   lisinopril 10 MG tablet Commonly known as:  PRINIVIL,ZESTRIL   multivitamin with minerals Tabs tablet   omega-3 acid ethyl esters 1 g capsule Commonly known as:  LOVAZA   potassium chloride SA 20 MEQ tablet Commonly known as:  K-DUR,KLOR-CON   Vitamin D (Ergocalciferol) 50000 units Caps capsule Commonly known as:  DRISDOL     TAKE these medications   acetaminophen 325 MG tablet Commonly known as:  TYLENOL Take 2 tablets (650 mg total) by mouth every 6 (six) hours as needed for mild pain (or Fever >/= 101). What changed:    how much to take  reasons to take this   bisacodyl 10 MG suppository Commonly known as:  DULCOLAX Place 1 suppository (10 mg total) rectally daily as needed for moderate constipation.   donepezil 10 MG tablet Commonly known as:  ARICEPT Take 10 mg by mouth at bedtime.   eucerin cream Apply 1 application topically 2 (two) times daily as needed for dry skin. Pt applies to both feet.   loperamide 2 MG capsule Commonly known as:  IMODIUM Take 2 mg by mouth daily as needed for diarrhea or loose stools.   LORazepam 0.5 MG tablet Commonly known as:  ATIVAN Take 1 tablet (0.5 mg total) by mouth every 4 (four) hours as needed for anxiety.   pantoprazole 40 MG tablet Commonly known as:  PROTONIX Take 1 tablet (40 mg total) by mouth 2 (two) times daily.   sucralfate 1 g tablet Commonly known as:  CARAFATE Take 1 tablet (1 g total) by mouth 4 (four) times daily -  before meals and at bedtime. What changed:  when to take this        Relevant Imaging Results:  Relevant Lab Results:   Additional Information SS#: [id number]. return to ALF with Hospice  Coralyn HellingBernette Donnel Venuto, LCSW

## 2018-04-26 NOTE — Progress Notes (Addendum)
LCSW following for disposition.   [facility]   LCSW faxed dc documents to facility.   LCSW attempted to reach nurse regarding return. LCSW left two message.   Patient's daughter will transport patient.   RN report number: [phone]  Coralyn HellingBernette Prateek Knipple, LSCW [facility] CSW [phone]

## 2018-04-26 NOTE — Discharge Summary (Signed)
Discharge Summary  Tami Harris YHC:[id number] DOB: 28-Jul-1924  PCP: Merlene Laughter, MD  Admit date: 04/23/2018 Discharge date: 04/26/2018  Time spent: 45 mins  Recommendations for Outpatient Follow-up:  1. Hospice care  Discharge Diagnoses:  Active Hospital Problems   Diagnosis Date Noted  . CAP (community acquired pneumonia) 04/23/2018  . Advanced care planning/counseling discussion   . Acute lower UTI 04/23/2018  . Dementia 10/18/2011    Resolved Hospital Problems  No resolved problems to display.    Discharge Condition: Stable  Diet recommendation: Dysphagia 3 solids, thin liquid  Vitals:   04/25/18 1925 04/26/18 0439  BP: (!) 152/68 135/61  Pulse: 65 (!) 55  Resp: (!) 24 20  Temp: 98.4 F (36.9 C) 98.3 F (36.8 C)  SpO2: 94% 96%    History of present illness:  84 year old female with past medical history significant for Alzheimer's dementia, PVD, hip fracture status post repair, GI bleed, hypertension, macular degeneration, admitted on 04/23/2018 with cough, and UA suggestive of UTI. Admitted for further management.  Today, met patient resting, easily arousable. Looks comfortable. Discussed in details with daughter today who expressed her wishes to treat the UTI as she changed her mind. I told her we could do a one time fosfomycin which should take care of E.faecalis, daughter in agreement.  Hospital Course:  Principal Problem:   CAP (community acquired pneumonia) Active Problems:   Dementia   Acute lower UTI   Advanced care planning/counseling discussion  UTI Afebrile, with resolving leukocytosis UA suggestive of UTI, moderate leukocytes, many bacteria, 11-20 WBC UC >100,000 E faecalis BC x2, NGTD S/P IV Rocephin--->PO fosfomycin X 1 dose  Dysphasia SLP on board, MBS showed mild oral dysphasia, with risk of aspiration due to dysphagia and dementia Continue dysphasia 3 solids, thin liquids Aspiration precautions  Dementia Continue  Aricept  Hypertension Stable  GOC Palliative care on board, discharge back to [facility] with hospice   Procedures:  None   Consultations:  Palliative team  Discharge Exam: BP 135/61 (BP Location: Left Arm)   Pulse (!) 55   Temp 98.3 F (36.8 C)   Resp 20   Ht '5\' 4"'$  (1.626 m)   Wt 65.4 kg   SpO2 96%   BMI 24.75 kg/m   General: NAD  Cardiovascular: S1, S2 present Respiratory: Mild wheezing noted bilaterally  Discharge Instructions You were cared for by a hospitalist during your hospital stay. If you have any questions about your discharge medications or the care you received while you were in the hospital after you are discharged, you can call the unit and asked to speak with the hospitalist on call if the hospitalist that took care of you is not available. Once you are discharged, your primary care physician will handle any further medical issues. Please note that NO REFILLS for any discharge medications will be authorized once you are discharged, as it is imperative that you return to your primary care physician (or establish a relationship with a primary care physician if you do not have one) for your aftercare needs so that they can reassess your need for medications and monitor your lab values.   Allergies as of 04/26/2018      Reactions   Sulfa Antibiotics Other (See Comments)   Reaction:  Unknown    Tape Other (See Comments)   Reaction:  Tears pts skin       Medication List    STOP taking these medications   aspirin EC 81 MG tablet  docusate sodium 100 MG capsule Commonly known as:  COLACE   ferrous sulfate 325 (65 FE) MG tablet   lisinopril 10 MG tablet Commonly known as:  PRINIVIL,ZESTRIL   multivitamin with minerals Tabs tablet   omega-3 acid ethyl esters 1 g capsule Commonly known as:  LOVAZA   potassium chloride SA 20 MEQ tablet Commonly known as:  K-DUR,KLOR-CON   Vitamin D (Ergocalciferol) 50000 units Caps capsule Commonly known  as:  DRISDOL     TAKE these medications   acetaminophen 325 MG tablet Commonly known as:  TYLENOL Take 2 tablets (650 mg total) by mouth every 6 (six) hours as needed for mild pain (or Fever >/= 101). What changed:    how much to take  reasons to take this   bisacodyl 10 MG suppository Commonly known as:  DULCOLAX Place 1 suppository (10 mg total) rectally daily as needed for moderate constipation.   donepezil 10 MG tablet Commonly known as:  ARICEPT Take 10 mg by mouth at bedtime.   eucerin cream Apply 1 application topically 2 (two) times daily as needed for dry skin. Pt applies to both feet.   loperamide 2 MG capsule Commonly known as:  IMODIUM Take 2 mg by mouth daily as needed for diarrhea or loose stools.   LORazepam 0.5 MG tablet Commonly known as:  ATIVAN Take 1 tablet (0.5 mg total) by mouth every 4 (four) hours as needed for anxiety.   pantoprazole 40 MG tablet Commonly known as:  PROTONIX Take 1 tablet (40 mg total) by mouth 2 (two) times daily.   sucralfate 1 g tablet Commonly known as:  CARAFATE Take 1 tablet (1 g total) by mouth 4 (four) times daily -  before meals and at bedtime. What changed:  when to take this      Allergies  Allergen Reactions  . Sulfa Antibiotics Other (See Comments)    Reaction:  Unknown   . Tape Other (See Comments)    Reaction:  Tears pts skin       The results of significant diagnostics from this hospitalization (including imaging, microbiology, ancillary and laboratory) are listed below for reference.    Significant Diagnostic Studies: Dg Chest 2 View  Result Date: 04/23/2018 CLINICAL DATA:  Cough for 3 days. EXAM: CHEST - 2 VIEW COMPARISON:  06/25/2017 and prior radiographs FINDINGS: This is a low volume film. Cardiomegaly and mild bibasilar atelectasis identified. Moderate to large hiatal hernia again noted. No definite airspace disease noted. No pleural effusion, pneumothorax or acute bony abnormality. IMPRESSION:  Cardiomegaly with mild bibasilar atelectasis in this low volume film. Moderate to large hiatal hernia. Electronically Signed   By: Margarette Canada M.D.   On: 04/23/2018 16:45   Dg Swallowing Func-speech Pathology  Result Date: 04/25/2018 Objective Swallowing Evaluation: Type of Study: MBS-Modified Barium Swallow Study  Patient Details Name: LINLEY MOSKAL MRN: [id number] Date of Birth: Jun 24, 1924 Today's Date: 04/25/2018 Time: SLP Start Time (ACUTE ONLY): 1108 -SLP Stop Time (ACUTE ONLY): 1142 SLP Time Calculation (min) (ACUTE ONLY): 34 min Past Medical History: Past Medical History: Diagnosis Date . Age-related macular degeneration, wet, left eye (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 Allergic rhinitis  . Alzheimer's dementia dx'd 2008 . Diverticulosis  . Fall  . Family history of adverse reaction to anesthesia   daughter reports "either faulty tube crimped or swelling from allergic reaction caused the tube to crimp" . GERD (gastroesophageal reflux disease)  . Headache   "fragrant related" . Hiatal  hernia  . Memory loss  . Pneumonia 2013 . Radial fracture  . Seasonal allergies  Past Surgical History: Past Surgical History: Procedure Laterality Date . BLADDER SUSPENSION  X 2 . CATARACT EXTRACTION W/ INTRAOCULAR LENS IMPLANT Right  . DILATION AND CURETTAGE OF UTERUS   . FEMUR IM NAIL Right 06/17/2016  Procedure: INTRAMEDULLARY (IM) NAIL FEMORAL;  Surgeon: Renette Butters, MD;  Location: [facility];  Service: Orthopedics;  Laterality: Right; . INGUINAL HERNIA REPAIR Right  . PERIPHERAL VASCULAR CATHETERIZATION N/A 05/16/2016  Procedure: Lower Extremity Angiography;  Surgeon: Lorretta Harp, MD;  Location: [facility];  Service: Cardiovascular;  Laterality: N/A; HPI: Pt is a 84 y.o.female, with a history ofAlzheimer's, GERD, HH (moderate to large per CXR on admission), PNA, and HTN, presenting to the ED with several days of coughing. CXR without definiate airsapce disease.  Subjective: pt alert, needs cues for  initiation Assessment / Plan / Recommendation CHL IP CLINICAL IMPRESSIONS 04/25/2018 Clinical Impression Pt presents with mild oral dysphagia consistent with dysphagia and dementia.  She did not aspirate or penetrate any consistency.  Decreased oral control did result in premature spillage of small amount of thin to pyriform sinus x1.  Barium tablet given with thin precariously lodged at vallecular region without pt awareness.  Pt required pudding to clear into esophagus.  Pt did fortunately cough/throat clear without barium visualized in larynx/trachea with cough.  Daughter present and educated to findings of MBS as well as mitigation strategies.  Pt did not aspirate on MBS but is at risk due to dysphagia, dementia.  Will follow up briefly for education.  Daughter reports mother is "fearful" of eating but SLP did not observe behavior during MBS that resembled this feeling.   SLP Visit Diagnosis Dysphagia, oral phase (R13.11) Attention and concentration deficit following -- Frontal lobe and executive function deficit following -- Impact on safety and function Mild aspiration risk   CHL IP TREATMENT RECOMMENDATION 04/25/2018 Treatment Recommendations Therapy as outlined in treatment plan below   Prognosis 04/25/2018 Prognosis for Safe Diet Advancement Fair Barriers to Reach Goals Cognitive deficits Barriers/Prognosis Comment -- CHL IP DIET RECOMMENDATION 04/25/2018 SLP Diet Recommendations Dysphagia 3 (Mech soft) solids;Thin liquid Liquid Administration via Cup;Straw Medication Administration Whole meds with puree Compensations Minimize environmental distractions;Slow rate;Small sips/bites Postural Changes Seated upright at 90 degrees;Remain semi-upright after after feeds/meals (Comment)   CHL IP OTHER RECOMMENDATIONS 04/25/2018 Recommended Consults -- Oral Care Recommendations Oral care BID Other Recommendations --   CHL IP FOLLOW UP RECOMMENDATIONS 04/25/2018 Follow up Recommendations (No Data)   CHL IP FREQUENCY AND DURATION  04/25/2018 Speech Therapy Frequency (ACUTE ONLY) min 2x/week Treatment Duration 1 week      CHL IP ORAL PHASE 04/25/2018 Oral Phase Impaired Oral - Pudding Teaspoon -- Oral - Pudding Cup -- Oral - Honey Teaspoon -- Oral - Honey Cup -- Oral - Nectar Teaspoon -- Oral - Nectar Cup Delayed oral transit Oral - Nectar Straw Delayed oral transit Oral - Thin Teaspoon Delayed oral transit Oral - Thin Cup Delayed oral transit;Premature spillage Oral - Thin Straw Delayed oral transit;Premature spillage Oral - Puree Delayed oral transit Oral - Mech Soft -- Oral - Regular Delayed oral transit;Impaired mastication Oral - Multi-Consistency -- Oral - Pill Delayed oral transit Oral Phase - Comment --  CHL IP PHARYNGEAL PHASE 04/25/2018 Pharyngeal Phase Impaired Pharyngeal- Pudding Teaspoon -- Pharyngeal -- Pharyngeal- Pudding Cup -- Pharyngeal -- Pharyngeal- Honey Teaspoon -- Pharyngeal -- Pharyngeal- Honey Cup -- Pharyngeal -- Pharyngeal-  Nectar Teaspoon -- Pharyngeal -- Pharyngeal- Nectar Cup WFL Pharyngeal -- Pharyngeal- Nectar Straw WFL Pharyngeal -- Pharyngeal- Thin Teaspoon WFL Pharyngeal -- Pharyngeal- Thin Cup Delayed swallow initiation-pyriform sinuses Pharyngeal -- Pharyngeal- Thin Straw WFL Pharyngeal -- Pharyngeal- Puree WFL;Delayed swallow initiation-vallecula;Pharyngeal residue - valleculae;Reduced tongue base retraction Pharyngeal -- Pharyngeal- Mechanical Soft -- Pharyngeal -- Pharyngeal- Regular WFL;Delayed swallow initiation-vallecula;Pharyngeal residue - valleculae;Reduced tongue base retraction Pharyngeal -- Pharyngeal- Multi-consistency -- Pharyngeal -- Pharyngeal- Pill Delayed swallow initiation-vallecula;Pharyngeal residue - valleculae Pharyngeal -- Pharyngeal Comment pt did cough and throat clear with no barium in larynx, barium tablet lodged at vallecular region without awareness/sensation, further liquid boluses did not clear it and pt eventually required pudding to transit into esophagus, upon esoph sweep-  barium tablet momentarily lodged in esophagus  CHL IP CERVICAL ESOPHAGEAL PHASE 04/25/2018 Cervical Esophageal Phase WFL Pudding Teaspoon -- Pudding Cup -- Honey Teaspoon -- Honey Cup -- Nectar Teaspoon -- Nectar Cup -- Nectar Straw -- Thin Teaspoon -- Thin Cup -- Thin Straw -- Puree -- Mechanical Soft -- Regular -- Multi-consistency -- Pill -- Cervical Esophageal Comment -- Macario Golds 04/25/2018, 12:21 PM  Luanna Salk, [facility] SLP [phone]              Microbiology: Recent Results (from the past 240 hour(s))  Urine culture     Status: Abnormal   Collection Time: 04/23/18  6:23 PM  Result Value Ref Range Status   Specimen Description   Final    URINE, CLEAN CATCH Performed at [facility], [place] [address], [place], [place] [address]    Special Requests   Final    NONE Performed at [facility], [place] [address], [place], [place] [address]    Culture >=100,000 COLONIES/mL ENTEROCOCCUS FAECALIS (A)  Final   Report Status 04/26/2018 FINAL  Final   Organism ID, Bacteria ENTEROCOCCUS FAECALIS (A)  Final      Susceptibility   Enterococcus faecalis - MIC*    AMPICILLIN <=2 SENSITIVE Sensitive     LEVOFLOXACIN 1 SENSITIVE Sensitive     NITROFURANTOIN <=16 SENSITIVE Sensitive     VANCOMYCIN 2 SENSITIVE Sensitive     * >=100,000 COLONIES/mL ENTEROCOCCUS FAECALIS  Culture, blood (routine x 2) Call MD if unable to obtain prior to antibiotics being given     Status: None (Preliminary result)   Collection Time: 04/23/18  8:10 PM  Result Value Ref Range Status   Specimen Description   Final    BLOOD LEFT ANTECUBITAL Performed at [place] [address] [place], [place] [address]    Special Requests   Final    BOTTLES DRAWN AEROBIC AND ANAEROBIC Blood Culture adequate volume Performed at [place] [address], [place], [place] [address]    Culture   Final    NO GROWTH 2 DAYS Performed at [facility] [address] [place], [place] [address]    Report Status PENDING  Incomplete  Culture, blood (routine x 2) Call MD if unable to obtain prior to antibiotics being given     Status: None (Preliminary result)   Collection Time: 04/23/18  9:05 PM  Result Value Ref Range Status   Specimen Description   Final    BLOOD BLOOD RIGHT HAND Performed at [place] [address], [place], [place] [address]    Special Requests   Final    BOTTLES DRAWN AEROBIC AND ANAEROBIC Blood Culture adequate volume Performed at [facility]  [place] [address], [place], [place] [address]    Culture   Final    NO GROWTH 2 DAYS Performed at [place] [address] [place], [place] [address]    Report Status PENDING  Incomplete  MRSA PCR Screening     Status: Abnormal   Collection Time: 04/24/18 12:30 AM  Result Value Ref Range Status   MRSA by PCR POSITIVE (A) NEGATIVE Final    Comment:        The GeneXpert MRSA Assay (FDA approved for NASAL specimens only), is one component of a comprehensive MRSA colonization surveillance program. It is not intended to diagnose MRSA infection nor to guide or monitor treatment for MRSA infections. RESULT CALLED TO, READ BACK BY AND VERIFIED WITH: D HILL,RN '@0254'$  04/24/18 MKELLY Performed at [facility], [place] [address], [place], [place] [address]      Labs: Basic Metabolic Panel: Recent Labs  Lab 04/23/18 1601 04/25/18 0559  NA 144 145  K 3.7 3.3*  CL 107 109  CO2 24 27  GLUCOSE 104* 88  BUN 12 9  CREATININE 0.77 0.83  CALCIUM 8.8* 8.7*   Liver Function Tests: Recent Labs  Lab 04/23/18 1601  AST 24  ALT 19  ALKPHOS 81  BILITOT 0.7  PROT 6.5  ALBUMIN 2.8*   No results for input(s): LIPASE, AMYLASE in the last 168 hours. No results for input(s): AMMONIA in the last 168 hours. CBC: Recent Labs  Lab 04/23/18 1601 04/25/18 0559  WBC 14.8* 11.0*  NEUTROABS 11.6*   --   HGB 13.9 12.1  HCT 41.3 36.8  MCV 93.7 94.1  PLT 353 351   Cardiac Enzymes: No results for input(s): CKTOTAL, CKMB, CKMBINDEX, TROPONINI in the last 168 hours. BNP: BNP (last 3 results) Recent Labs    06/25/17 1515  BNP 91.5    ProBNP (last 3 results) No results for input(s): PROBNP in the last 8760 hours.  CBG: No results for input(s): GLUCAP in the last 168 hours.     Signed:  Alma Friendly, MD [facility] 04/26/2018, 11:59 AM

## 2018-04-26 NOTE — Progress Notes (Signed)
[facility]. Discharge instruction including medication and appointment was given to patient and daughter at bedside. No question at this time. Notified SW.

## 2018-04-26 NOTE — Progress Notes (Signed)
[facility] give a report to RN at [phone]. No question at this time.

## 2018-04-26 NOTE — Care Management Important Message (Signed)
Important Message  Patient Details  Name: Tami Harris Toelle MRN: [id number] Date of Birth: 08-22-1924   Medicare Important Message Given:  Yes    Caren MacadamFuller, Amand Lemoine 04/26/2018, 12:16 PMImportant Message  Patient Details  Name: Tami Harris Sargent MRN: [id number] Date of Birth: 08-22-1924   Medicare Important Message Given:  Yes    Caren MacadamFuller, Mylie Mccurley 04/26/2018, 12:16 PM

## 2018-04-26 NOTE — Care Management Note (Signed)
Case Management Note  Patient Details  Name: Tami Harris MRN: [id number] Date of Birth: [birth year]  Subjective/Objective:         [facility] with hospice services in place           Action/Plan:  No cm needs at time of discharge.   Expected Discharge Date:  04/26/18               Expected Discharge Plan:  Home w Hospice Care  In-House Referral:  Clinical Social Work  Discharge planning Services  CM Consult  Post Acute Care Choice:    Choice offered to:     DME Arranged:    DME Agency:     HH Arranged:    HH Agency:     Status of Service:  Completed, signed off  If discussed at [employer] Meetings, dates discussed:    Additional Comments:  Golda AcreDavis, Rhonda Lynn, RN 04/26/2018, 1:48 PM

## 2018-04-28 LAB — CULTURE, BLOOD (ROUTINE X 2)
Culture: NO GROWTH
Culture: NO GROWTH
SPECIAL REQUESTS: ADEQUATE
Special Requests: ADEQUATE

## 2018-05-14 ENCOUNTER — Non-Acute Institutional Stay: Payer: Self-pay | Admitting: Nurse Practitioner

## 2018-05-14 ENCOUNTER — Encounter: Payer: Self-pay | Admitting: Nurse Practitioner

## 2018-05-14 VITALS — BP 146/70 | HR 61 | Resp 18 | Wt 158.6 lb

## 2018-05-14 DIAGNOSIS — Z515 Encounter for palliative care: Secondary | ICD-10-CM

## 2018-05-14 DIAGNOSIS — R269 Unspecified abnormalities of gait and mobility: Secondary | ICD-10-CM

## 2018-05-14 DIAGNOSIS — F0390 Unspecified dementia without behavioral disturbance: Secondary | ICD-10-CM

## 2018-05-14 DIAGNOSIS — Z9181 History of falling: Secondary | ICD-10-CM

## 2018-05-14 NOTE — Progress Notes (Addendum)
PALLIATIVE CARE CONSULT VISIT   PATIENT NAME: Tami Harris DOB: Mar 20, 1924 MRN: [id number]  PRIMARY CARE PROVIDER:   Angela Cox, MD  REFERRING PROVIDER:  No referring provider defined for this encounter.  RESPONSIBLE PARTY:   Cipriano Mile (DTR) [phone] (hm and mobile)  ASSESSMENT/RECOMMENDATIONS and PLAN:  Dementia without behavioral disturbance -anxiety -gait disturbance/high fall risk -oriented to self only; staff report no acute issues; patient eating well and weight is stable -donepezil 10mg  qhs Lorazepam 0.5mg  Q4hrs -uses wheelchair   ACP -DNR and Most on chart   I spent 20 minutes providing this consultation,  from 1:15 to 1:35. More than 50% of the time in this consultation was spent coordinating communication.   HISTORY OF PRESENT ILLNESS:  Tami Harris is a 84 y.o. year old female with multiple medical problems including dementia, anxiety, gait disturbance, h/o fall with right hip and wrist fracture. Palliative Care was asked to assist with symptom management and provide ongoing patient and family support.   CODE STATUS: DNR/MOST  PPS: 40% HOSPICE ELIGIBILITY/DIAGNOSIS: TBD  PAST MEDICAL HISTORY:  Past Medical History:  Diagnosis Date  . 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 Allergic rhinitis   . Alzheimer's dementia dx'd 2008  . Diverticulosis   . Fall   . Family history of adverse reaction to anesthesia    daughter reports "either faulty tube crimped or swelling from allergic reaction caused the tube to crimp"  . GERD (gastroesophageal reflux disease)   . Headache    "fragrant related"  . Hiatal hernia   . Memory loss   . Pneumonia 2013  . Radial fracture   . Seasonal allergies     SOCIAL HX:  Social History   Tobacco Use  . Smoking status: Never Smoker  . Smokeless tobacco: Never Used  Substance Use Topics  . Alcohol use: No    Alcohol/week: 0.0 standard drinks     ALLERGIES:  Allergies  Allergen Reactions  . Sulfa Antibiotics Other (See Comments)    Reaction:  Unknown   . Tape Other (See Comments)    Reaction:  Tears pts skin      PERTINENT MEDICATIONS:  Outpatient Encounter Medications as of 05/14/2018  Medication Sig  . acetaminophen (TYLENOL) 325 MG tablet Take 2 tablets (650 mg total) by mouth every 6 (six) hours as needed for mild pain (or Fever >/= 101).  . bisacodyl (DULCOLAX) 10 MG suppository Place 1 suppository (10 mg total) rectally daily as needed for moderate constipation.  Marland Kitchen donepezil (ARICEPT) 10 MG tablet Take 10 mg by mouth at bedtime.  Marland Kitchen loperamide (IMODIUM) 2 MG capsule Take 2 mg by mouth daily as needed for diarrhea or loose stools.  Marland Kitchen LORazepam (ATIVAN) 0.5 MG tablet Take 1 tablet (0.5 mg total) by mouth every 4 (four) hours as needed for anxiety.  . pantoprazole (PROTONIX) 40 MG tablet Take 1 tablet (40 mg total) by mouth 2 (two) times daily.  . Skin Protectants, Misc. (EUCERIN) cream Apply 1 application topically 2 (two) times daily as needed for dry skin. Pt applies to both feet.  . sucralfate (CARAFATE) 1 g tablet Take 1 tablet (1 g total) by mouth 4 (four) times daily -  before meals and at bedtime.   No facility-administered encounter medications on file as of 05/14/2018.     PHYSICAL EXAM:   General: NAD, frail appearing, thin Cardiovascular: regular rate and rhythm Pulmonary: clear ant  fields Abdomen: soft, nontender, + bowel sounds GU: no suprapubic tenderness Extremities: no edema, no joint deformities Skin: no rashes Neurological:  Nonfocal, alert, talkative, oriented to self only  Tami Vandyne G Swaz[place], NP

## 2018-06-04 ENCOUNTER — Emergency Department (HOSPITAL_COMMUNITY)
Admission: EM | Admit: 2018-06-04 | Discharge: 2018-06-04 | Disposition: A | Discharge status: Home / Self Care | Payer: Medicare Other | Attending: Emergency Medicine | Admitting: Emergency Medicine

## 2018-06-04 ENCOUNTER — Encounter (HOSPITAL_COMMUNITY): Payer: Self-pay

## 2018-06-04 DIAGNOSIS — Z79899 Other long term (current) drug therapy: Secondary | ICD-10-CM | POA: Diagnosis not present

## 2018-06-04 DIAGNOSIS — F0280 Dementia in other diseases classified elsewhere without behavioral disturbance: Secondary | ICD-10-CM | POA: Diagnosis not present

## 2018-06-04 DIAGNOSIS — W06XXXA Fall from bed, initial encounter: Secondary | ICD-10-CM | POA: Diagnosis not present

## 2018-06-04 DIAGNOSIS — G309 Alzheimer's disease, unspecified: Secondary | ICD-10-CM | POA: Diagnosis not present

## 2018-06-04 DIAGNOSIS — M545 Low back pain: Secondary | ICD-10-CM | POA: Insufficient documentation

## 2018-06-04 DIAGNOSIS — W19XXXD Unspecified fall, subsequent encounter: Secondary | ICD-10-CM

## 2018-06-04 NOTE — ED Provider Notes (Signed)
[facility]-EMERGENCY DEPT Provider Note   CSN: [id number] Arrival date & time: 06/04/18  1406     History   Chief Complaint Chief Complaint  Patient presents with  . Fall    HPI SISTER CARBONE is a 84 y.o. female.  Patient is a 84 year old female with dementia who presents the ED after fall.  Patient not on blood thinners.  Patient did not hit her head, loss consciousness.  Family members at the bedside and states that patient appears to be at her baseline.  Is alert.  Patient does not recognize her daughter but otherwise she moves all of her extremities.  Family member states that patient would not want any intervention if she had any significant head injury.  She is comfortable with sending patient back to facility after she is able to talk with the patient.  The history is provided by the patient and a relative.  Fall  This is a new problem. The current episode started 3 to 5 hours ago. The problem occurs constantly. The problem has not changed since onset.Pertinent negatives include no chest pain, no abdominal pain, no headaches and no shortness of breath. Nothing aggravates the symptoms. Nothing relieves the symptoms. She has tried nothing for the symptoms. The treatment provided no relief.    Past Medical History:  Diagnosis Date  . 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 Allergic rhinitis   . Alzheimer's dementia dx'd 2008  . Diverticulosis   . Fall   . Family history of adverse reaction to anesthesia    daughter reports "either faulty tube crimped or swelling from allergic reaction caused the tube to crimp"  . GERD (gastroesophageal reflux disease)   . Headache    "fragrant related"  . Hiatal hernia   . Memory loss   . Pneumonia 2013  . Radial fracture   . Seasonal allergies     Patient Active Problem List   Diagnosis Date Noted  . Advanced care planning/counseling discussion   . CAP  (community acquired pneumonia) 04/23/2018  . Acute lower UTI 04/23/2018  . Altered mental status 06/25/2017  . Hypertension 06/25/2017  . GI bleed 03/28/2017  . Acute GI bleeding 03/27/2017  . Hiatal hernia with GERD and esophagitis 03/27/2017  . Leucocytosis 03/27/2017  . Constipation 03/27/2017  . Intractable nausea and vomiting 03/27/2017  . Palliative care encounter   . Closed right hip fracture (HCC) 06/16/2016  . Hip fracture (HCC) 06/16/2016  . Goals of care, counseling/discussion   . Advance care planning   . Palliative care by specialist   . Fall 04/18/2016  . Distal radius fracture 04/18/2016  . Syncope 04/18/2016  . PVD (peripheral vascular disease) (HCC)   . Critical lower limb ischemia 04/08/2016  . 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 Pneumonia 10/18/2011  . Dementia 10/18/2011    Past Surgical History:  Procedure Laterality Date  . BLADDER SUSPENSION  X 2  . CATARACT EXTRACTION W/ INTRAOCULAR LENS IMPLANT Right   . DILATION AND CURETTAGE OF UTERUS    . FEMUR IM NAIL Right 06/17/2016   Procedure: INTRAMEDULLARY (IM) NAIL FEMORAL;  Surgeon: Sheral Apley, MD;  Location: [facility] OR;  Service: Orthopedics;  Laterality: Right;  . INGUINAL HERNIA REPAIR Right   . PERIPHERAL VASCULAR CATHETERIZATION N/A 05/16/2016   Procedure: Lower Extremity Angiography;  Surgeon: Runell Gess, MD;  Location: [facility] INVASIVE CV LAB;  Service: Cardiovascular;  Laterality: N/A;  OB History   None      Home Medications    Prior to Admission medications   Medication Sig Start Date End Date Taking? Authorizing Provider  benzonatate (TESSALON) 100 MG capsule Take by mouth daily.   Yes [provider]  donepezil (ARICEPT) 10 MG tablet Take 10 mg by mouth at bedtime.   Yes [provider]  guaiFENesin (MUCINEX) 600 MG 12 hr tablet Take 600 mg by mouth every 12 (twelve) hours.   Yes [provider]  LORazepam (ATIVAN) 0.5 MG tablet Take 1 tablet (0.5 mg  total) by mouth every 4 (four) hours as needed for anxiety. 04/26/18  Yes Briant CedarEzenduka, Nkeiruka J, MD  pantoprazole (PROTONIX) 40 MG tablet Take 1 tablet (40 mg total) by mouth 2 (two) times daily. Patient taking differently: Take 40 mg by mouth 5 (five) times daily.  03/29/17 06/04/18 Yes Arrien, York RamMauricio Daniel, MD  Skin Protectants, Misc. (EUCERIN) cream Apply 1 application topically 2 (two) times daily as needed for dry skin. Pt applies to both feet.   Yes [provider]  sucralfate (CARAFATE) 1 g tablet Take 1 tablet (1 g total) by mouth 4 (four) times daily -  before meals and at bedtime. 04/26/18  Yes Briant CedarEzenduka, Nkeiruka J, MD  acetaminophen (TYLENOL) 325 MG tablet Take 2 tablets (650 mg total) by mouth every 6 (six) hours as needed for mild pain (or Fever >/= 101). 04/26/18   Briant CedarEzenduka, Nkeiruka J, MD  bisacodyl (DULCOLAX) 10 MG suppository Place 1 suppository (10 mg total) rectally daily as needed for moderate constipation. 06/21/16   Richarda OverlieAbrol, Nayana, MD  loperamide (IMODIUM) 2 MG capsule Take 2 mg by mouth daily as needed for diarrhea or loose stools.    [provider]    Family History Family History  Problem Relation Age of Onset  . Dementia Mother   . Heart failure Mother   . Heart failure Father   . Parkinsonism Brother     Social History Social History   Tobacco Use  . Smoking status: Never Smoker  . Smokeless tobacco: Never Used  Substance Use Topics  . Alcohol use: No    Alcohol/week: 0.0 standard drinks  . Drug use: No     Allergies   Other; Sulfa antibiotics; and Tape   Review of Systems Review of Systems  Constitutional: Negative for chills and fever.  HENT: Negative for ear pain and sore throat.   Eyes: Negative for pain and visual disturbance.  Respiratory: Negative for cough and shortness of breath.   Cardiovascular: Negative for chest pain and palpitations.  Gastrointestinal: Negative for abdominal pain and vomiting.  Genitourinary: Negative for  dysuria and hematuria.  Musculoskeletal: Negative for arthralgias and back pain.  Skin: Negative for color change and rash.  Neurological: Negative for seizures, syncope and headaches.  All other systems reviewed and are negative.    Physical Exam Updated Vital Signs  ED Triage Vitals  Enc Vitals Group     BP 06/04/18 1441 (!) 209/80     Pulse Rate 06/04/18 1441 68     Resp 06/04/18 1441 18     Temp 06/04/18 1441 98 F (36.7 C)     Temp Source 06/04/18 1441 Oral     SpO2 06/04/18 1439 97 %     Weight --      Height --      Head Circumference --      Peak Flow --      Pain Score --  Pain Loc --      Pain Edu? --      Excl. in GC? --     Physical Exam  Constitutional: She appears well-developed and well-nourished. No distress.  HENT:  Head: Normocephalic and atraumatic.  Eyes: Conjunctivae and EOM are normal.  Neck: Normal range of motion. Neck supple.  Cardiovascular: Normal rate, regular rhythm, normal heart sounds and intact distal pulses.  No murmur heard. Pulmonary/Chest: Effort normal and breath sounds normal. No respiratory distress.  Abdominal: Soft. There is no tenderness.  Musculoskeletal: Normal range of motion. She exhibits no edema or tenderness.  Neurological: She is alert.  Patient with normal sensation, moves all of her extremities, mildly confused but no slurred speech, no facial droop, cranial nerves appear grossly intact  Skin: Skin is warm and dry. Capillary refill takes less than 2 seconds.  Psychiatric: She has a normal mood and affect.  Nursing note and vitals reviewed.    ED Treatments / Results  Labs (all labs ordered are listed, but only abnormal results are displayed) Labs Reviewed - No data to display  EKG None  Radiology No results found.  Procedures Procedures (including critical care time)  Medications Ordered in ED Medications - No data to display   Initial Impression / Assessment and Plan / ED Course  I have  reviewed the triage vital signs and the nursing notes.  Pertinent labs & imaging results that were available during my care of the patient were reviewed by me and considered in my medical decision making (see chart for details).     AUTRY DROEGE is 84 year old female with history of dementia who presents to the ED after a fall.  Patient with normal vitals.  No fever.  Patient sent from facility after she had an unwitnessed fall.  Family members here and states that patient appears at her baseline.  May be more confusion than normal but she has normal vitals, no fever.  No signs of external trauma.  Neurologically intact.  Family member states that patient is a DNR and if there is any significant head injury they would not want any intervention.  At this time after long discussion with the family member we will hold off on any imaging or any lab work at this time.  Suspect mechanical fall as patient has history of the stain.  Shared decision was made to discharge the patient back to facility to closely watch her and have her return if she has any significant neurological change or develops a fever.  Patient moves all extremities, no midline spinal pain.  No pain to palpation of bony prominences.  Patient is wheelchair-bound.  Family is comfortable with this plan and patient was discharged from the ED in good condition.  This chart was dictated using voice recognition software.  Despite best efforts to proofread,  errors can occur which can change the documentation meaning.  Final Clinical Impressions(s) / ED Diagnoses   Final diagnoses:  Fall, subsequent encounter    ED Discharge Orders    None       Virgina Norfolk, DO 06/04/18 1534

## 2018-06-04 NOTE — Discharge Instructions (Addendum)
Please call daughter if there is any significant change in neurological status, fever greater than 100.4.

## 2018-06-04 NOTE — ED Notes (Signed)
Bed: WA07 Expected date:  Expected time:  Means of arrival:  Comments: 84 yo fall

## 2018-06-04 NOTE — ED Triage Notes (Signed)
Per EMS, [facility] fell out of her bed. Pt called staff after falling. Pt denies loss of consciousness or head injury. Pt is not blood thinners.  Pt complains of lower back pain, worse with palpation and movement.

## 2018-06-04 NOTE — ED Notes (Signed)
Attempted report to [facility]. Unable to give report to a Charity fundraiserN. Desk stated they were busy and left a message.

## 2018-06-04 NOTE — ED Notes (Signed)
[facility] called for pt 

## 2018-07-12 ENCOUNTER — Other Ambulatory Visit: Payer: Self-pay

## 2018-07-12 ENCOUNTER — Emergency Department (HOSPITAL_COMMUNITY)

## 2018-07-12 ENCOUNTER — Encounter (HOSPITAL_COMMUNITY): Payer: Self-pay

## 2018-07-12 ENCOUNTER — Inpatient Hospital Stay (HOSPITAL_COMMUNITY)
Admission: EM | Admit: 2018-07-12 | Discharge: 2018-07-15 | DRG: 534 | Disposition: A | Discharge status: Hospice | Attending: Internal Medicine | Admitting: Internal Medicine

## 2018-07-12 DIAGNOSIS — Y92098 Other place in other non-institutional residence as the place of occurrence of the external cause: Secondary | ICD-10-CM

## 2018-07-12 DIAGNOSIS — Z66 Do not resuscitate: Secondary | ICD-10-CM | POA: Diagnosis present

## 2018-07-12 DIAGNOSIS — Z515 Encounter for palliative care: Secondary | ICD-10-CM | POA: Diagnosis present

## 2018-07-12 DIAGNOSIS — H5440 Blindness, one eye, unspecified eye: Secondary | ICD-10-CM | POA: Diagnosis present

## 2018-07-12 DIAGNOSIS — Z7401 Bed confinement status: Secondary | ICD-10-CM

## 2018-07-12 DIAGNOSIS — Z9109 Other allergy status, other than to drugs and biological substances: Secondary | ICD-10-CM

## 2018-07-12 DIAGNOSIS — S72302A Unspecified fracture of shaft of left femur, initial encounter for closed fracture: Secondary | ICD-10-CM | POA: Diagnosis not present

## 2018-07-12 DIAGNOSIS — S72402A Unspecified fracture of lower end of left femur, initial encounter for closed fracture: Secondary | ICD-10-CM | POA: Diagnosis not present

## 2018-07-12 DIAGNOSIS — Y9301 Activity, walking, marching and hiking: Secondary | ICD-10-CM | POA: Diagnosis present

## 2018-07-12 DIAGNOSIS — I10 Essential (primary) hypertension: Secondary | ICD-10-CM | POA: Diagnosis not present

## 2018-07-12 DIAGNOSIS — Z79899 Other long term (current) drug therapy: Secondary | ICD-10-CM

## 2018-07-12 DIAGNOSIS — F0280 Dementia in other diseases classified elsewhere without behavioral disturbance: Secondary | ICD-10-CM | POA: Diagnosis not present

## 2018-07-12 DIAGNOSIS — G309 Alzheimer's disease, unspecified: Secondary | ICD-10-CM | POA: Diagnosis present

## 2018-07-12 DIAGNOSIS — W1830XA Fall on same level, unspecified, initial encounter: Secondary | ICD-10-CM | POA: Diagnosis present

## 2018-07-12 DIAGNOSIS — D649 Anemia, unspecified: Secondary | ICD-10-CM | POA: Diagnosis present

## 2018-07-12 DIAGNOSIS — S72309A Unspecified fracture of shaft of unspecified femur, initial encounter for closed fracture: Secondary | ICD-10-CM | POA: Diagnosis present

## 2018-07-12 DIAGNOSIS — F0390 Unspecified dementia without behavioral disturbance: Secondary | ICD-10-CM | POA: Diagnosis present

## 2018-07-12 DIAGNOSIS — K219 Gastro-esophageal reflux disease without esophagitis: Secondary | ICD-10-CM | POA: Diagnosis present

## 2018-07-12 DIAGNOSIS — Z8701 Personal history of pneumonia (recurrent): Secondary | ICD-10-CM

## 2018-07-12 DIAGNOSIS — Z820 Family history of epilepsy and other diseases of the nervous system: Secondary | ICD-10-CM

## 2018-07-12 DIAGNOSIS — Z9841 Cataract extraction status, right eye: Secondary | ICD-10-CM

## 2018-07-12 DIAGNOSIS — H353220 Exudative age-related macular degeneration, left eye, stage unspecified: Secondary | ICD-10-CM | POA: Diagnosis present

## 2018-07-12 DIAGNOSIS — Z961 Presence of intraocular lens: Secondary | ICD-10-CM | POA: Diagnosis present

## 2018-07-12 DIAGNOSIS — Z882 Allergy status to sulfonamides status: Secondary | ICD-10-CM

## 2018-07-12 DIAGNOSIS — Z87440 Personal history of urinary (tract) infections: Secondary | ICD-10-CM

## 2018-07-12 LAB — CBC
HEMATOCRIT: 36.5 % (ref 36.0–46.0)
Hemoglobin: 11.9 g/dL — ABNORMAL LOW (ref 12.0–15.0)
MCH: 30.7 pg (ref 26.0–34.0)
MCHC: 32.6 g/dL (ref 30.0–36.0)
MCV: 94.1 fL (ref 80.0–100.0)
PLATELETS: 262 10*3/uL (ref 150–400)
RBC: 3.88 MIL/uL (ref 3.87–5.11)
RDW: 13.4 % (ref 11.5–15.5)
WBC: 10.1 10*3/uL (ref 4.0–10.5)
nRBC: 0 % (ref 0.0–0.2)

## 2018-07-12 LAB — CREATININE, SERUM
Creatinine, Ser: 0.62 mg/dL (ref 0.44–1.00)
GFR calc Af Amer: >60 mL/min (ref >60)
GFR calc non Af Amer: >60 mL/min (ref >60)

## 2018-07-12 MED ORDER — CRANBERRY 450 MG PO TABS: 1.0000 | ORAL_TABLET | Freq: Two times a day (BID) | ORAL | Status: DC

## 2018-07-12 MED ORDER — PANTOPRAZOLE SODIUM 40 MG PO TBEC
40.0000 mg | DELAYED_RELEASE_TABLET | Freq: Two times a day (BID) | ORAL | Status: DC
  Administered 2018-07-12 – 2018-07-13 (×3): 40 mg via ORAL
  Filled 2018-07-12 (×3): qty 1

## 2018-07-12 MED ORDER — ACETAMINOPHEN 325 MG PO TABS
650.0000 mg | ORAL_TABLET | Freq: Four times a day (QID) | ORAL | Status: DC | PRN
  Administered 2018-07-13: 650 mg via ORAL
  Filled 2018-07-12: qty 2

## 2018-07-12 MED ORDER — SUCRALFATE 1 G PO TABS
1.0000 g | ORAL_TABLET | Freq: Three times a day (TID) | ORAL | Status: DC
  Administered 2018-07-12 – 2018-07-13 (×5): 1 g via ORAL
  Filled 2018-07-12 (×5): qty 1

## 2018-07-12 MED ORDER — ENOXAPARIN SODIUM 40 MG/0.4ML ~~LOC~~ SOLN
40.0000 mg | SUBCUTANEOUS | Status: DC
  Administered 2018-07-13: 40 mg via SUBCUTANEOUS
  Filled 2018-07-12: qty 0.4

## 2018-07-12 MED ORDER — ACETAMINOPHEN 650 MG RE SUPP: 650.0000 mg | Freq: Four times a day (QID) | RECTAL | Status: DC | PRN

## 2018-07-12 MED ORDER — FENTANYL CITRATE (PF) 100 MCG/2ML IJ SOLN
50.0000 ug | INTRAMUSCULAR | Status: DC | PRN
  Administered 2018-07-12 (×2): 50 ug via INTRAVENOUS
  Filled 2018-07-12 (×2): qty 2

## 2018-07-12 MED ORDER — LISINOPRIL 5 MG PO TABS
5.0000 mg | ORAL_TABLET | Freq: Every day | ORAL | Status: DC
  Administered 2018-07-13: 5 mg via ORAL
  Filled 2018-07-12: qty 1

## 2018-07-12 MED ORDER — HYDROCERIN EX CREA
1.0000 "application " | TOPICAL_CREAM | CUTANEOUS | Status: DC
  Administered 2018-07-13: 1 via TOPICAL
  Filled 2018-07-12: qty 113

## 2018-07-12 MED ORDER — ONDANSETRON HCL 4 MG/2ML IJ SOLN: 4.0000 mg | Freq: Four times a day (QID) | INTRAMUSCULAR | Status: DC | PRN

## 2018-07-12 MED ORDER — DONEPEZIL HCL 10 MG PO TABS
10.0000 mg | ORAL_TABLET | Freq: Every day | ORAL | Status: DC
  Administered 2018-07-12 – 2018-07-13 (×2): 10 mg via ORAL
  Filled 2018-07-12 (×2): qty 1
  Filled 2018-07-12: qty 2

## 2018-07-12 MED ORDER — FENTANYL CITRATE (PF) 100 MCG/2ML IJ SOLN
12.5000 ug | INTRAMUSCULAR | Status: DC | PRN
  Administered 2018-07-13 (×3): 12.5 ug via INTRAVENOUS
  Filled 2018-07-12 (×3): qty 2

## 2018-07-12 MED ORDER — ONDANSETRON HCL 4 MG PO TABS: 4.0000 mg | ORAL_TABLET | Freq: Four times a day (QID) | ORAL | Status: DC | PRN

## 2018-07-12 MED ORDER — BISACODYL 10 MG RE SUPP: 10.0000 mg | Freq: Every day | RECTAL | Status: DC | PRN

## 2018-07-12 MED ORDER — LORAZEPAM 0.5 MG PO TABS
0.5000 mg | ORAL_TABLET | ORAL | Status: DC | PRN
  Administered 2018-07-13: 0.5 mg via ORAL
  Filled 2018-07-12: qty 1

## 2018-07-12 NOTE — Progress Notes (Signed)
Orthopedic Tech Progress Note Patient Details:  Tami Harris 04-May-1924 [id number] Pt was placed in a knee Immobilizer with the help of Dr. Charm Barges. Patient ID: [name], female   DOB: 01-05-1924, 84 y.o.   MRN: [id number]   [name] [facility] 07/12/2018, 4:43 PM

## 2018-07-12 NOTE — ED Notes (Signed)
HOSPICE RN AT BEDSIDE SPEAKING WITH FAMILY. UPDATED ON PT'S CURRENT STATUS

## 2018-07-12 NOTE — ED Triage Notes (Addendum)
Per [facility]- DNR- yellow copy. [facility]. Unwitnessed fall. Found by staff on floor in her room. Pt found in position of comfort. LUE deformity.  Fentalyl IVP given in route. Pt tolerated. 02 2LNC applied 02 sats 93%. Pt is not on home 02. No other complaints present. Pt also has MOST form and current under hospice care.

## 2018-07-12 NOTE — ED Notes (Signed)
ED Provider at bedside. 

## 2018-07-12 NOTE — ED Notes (Signed)
ED TO INPATIENT HANDOFF REPORT  Name/Age/Gender Tami Harris 84 y.o. female  Code Status    Code Status Orders  (From admission, onward)         Start     Ordered   07/12/18 2158  Do not attempt resuscitation (DNR)  Continuous    Question Answer Comment  In the event of cardiac or respiratory ARREST Do not call a "code blue"   In the event of cardiac or respiratory ARREST Do not perform Intubation, CPR, defibrillation or ACLS   In the event of cardiac or respiratory ARREST Use medication by any route, position, wound care, and other measures to relive pain and suffering. May use oxygen, suction and manual treatment of airway obstruction as needed for comfort.      07/12/18 2200        Code Status History    Date Active Date Inactive Code Status Order ID Comments User Context   07/12/2018 2001 07/12/2018 2200 DNR [id number]  Rise Patience, MD ED   04/25/2018 1536 04/26/2018 1726 DNR [id number]  Earlie Counts, NP Inpatient   04/23/2018 1950 04/25/2018 1535 DNR [id number]  Etta Quill, DO ED   06/25/2017 2142 06/26/2017 2016 DNR [id number]  Jani Gravel, MD Inpatient   06/25/2017 2015 06/25/2017 2142 DNR [id number]  Merrily Pew, MD ED   03/27/2017 0843 03/29/2017 1759 DNR [id number]  Lavina Hamman, MD Inpatient   06/16/2016 0527 06/22/2016 2308 DNR [id number]  Rise Patience, MD ED   05/16/2016 1227 05/16/2016 2004 Full Code [id number]  Lorretta Harp, MD Inpatient   04/18/2016 1050 04/20/2016 1832 DNR [id number]  Radene Gunning, NP ED   04/18/2016 1049 04/18/2016 1050 Full Code [id number]  Radene Gunning, NP ED   10/18/2011 1752 10/19/2011 2023 DNR [id number]  Samuella Cota, MD Inpatient    Advance Directive Documentation     Most Recent Value  Type of Advance Directive  Out of facility DNR (pink MOST or yellow form), Healthcare Power of Attorney, Living will  Pre-existing out of facility DNR order (yellow form or pink MOST form)  Yellow form placed in chart (order not valid for  inpatient use), Pink MOST form placed in chart (order not valid for inpatient use)  "MOST" Form in Place?  -      Home/SNF/Other Home  Chief Complaint Fall  Level of Care/Admitting Diagnosis ED Disposition    ED Disposition Condition [place]: [facility] [100102]  Level of Care: Med-Surg [16]  Diagnosis: Fracture, femur closed, shaft ([place]) [932671]  Admitting Physician: Rise Patience [id number]  Attending Physician: Rise Patience Lei.Right  PT Class (Do Not Modify): Observation [104]  PT Acc Code (Do Not Modify): Observation [10022]       Medical History Past Medical History:  Diagnosis Date  . Age-related macular degeneration, wet, left eye ([place])    "partially blind in that eye"  . Allergic rhinitis   . Alzheimer's dementia ([place]) dx'd 2008  . Diverticulosis   . Fall   . Family history of adverse reaction to anesthesia    daughter reports "either faulty tube crimped or swelling from allergic reaction caused the tube to crimp"  . GERD (gastroesophageal reflux disease)   . Headache    "fragrant related"  . Hiatal hernia   . Memory loss   . Pneumonia 2013  . Radial fracture   . Seasonal allergies     Allergies Allergies  Allergen Reactions  . Other     SKIN MAY TEAR  . Sulfa Antibiotics Other (See Comments)    Reaction:  Unknown  Unable to state  . Tape Other (See Comments)    Reaction:  Tears pts skin     IV Location/Drains/Wounds Patient Lines/Drains/Airways Status   Active Line/Drains/Airways    Name:   Placement date:   Placement time:   Site:   Days:   Peripheral IV 07/12/18 Right Hand   07/12/18    1315    Hand   less than 1   External Urinary Catheter   04/23/18    2045    -   80          Labs/Imaging Results for orders placed or performed during the hospital encounter of 07/12/18 (from the past 48 hour(s))  CBC     Status: Abnormal   Collection Time: 07/12/18 11:10 PM  Result Value Ref Range    WBC 10.1 4.0 - 10.5 K/uL   RBC 3.88 3.87 - 5.11 MIL/uL   Hemoglobin 11.9 (L) 12.0 - 15.0 g/dL   HCT 36.5 36.0 - 46.0 %   MCV 94.1 80.0 - 100.0 fL   MCH 30.7 26.0 - 34.0 pg   MCHC 32.6 30.0 - 36.0 g/dL   RDW 13.4 11.5 - 15.5 %   Platelets 262 150 - 400 K/uL   nRBC 0.0 0.0 - 0.2 %    Comment: Performed at [facility], [place] [address], [place], [place] [address]  Creatinine, serum     Status: None   Collection Time: 07/12/18 11:10 PM  Result Value Ref Range   Creatinine, Ser 0.62 0.44 - 1.00 mg/dL   GFR calc non Af Amer >60 >60 mL/min   GFR calc Af Amer >60 >60 mL/min    Comment: (NOTE) The eGFR has been calculated using the CKD EPI equation. This calculation has not been validated in all clinical situations. eGFR's persistently <60 mL/min signify possible Chronic Kidney Disease. Performed at [facility], [place] [address], [place], [place] [address]    Dg Femur 1 View Left  Result Date: 07/12/2018 CLINICAL DATA:  Recent fall with left leg pain and deformity, initial encounter EXAM: LEFT FEMUR 1 VIEW COMPARISON:  None. FINDINGS: Comminuted left femoral fracture is noted with impaction at the fracture site. IMPRESSION: Comminuted mid to distal left femoral fracture Electronically Signed   By: Inez Catalina M.D.   On: 07/12/2018 15:54   None  Pending Labs Unresulted Labs (From admission, onward)    Start     Ordered   07/19/18 0500  Creatinine, serum  (enoxaparin (LOVENOX)    CrCl >/= 30 ml/min)  Weekly,   R    Comments:  while on enoxaparin therapy    07/12/18 2200          Vitals/Pain Today's Vitals   07/12/18 2130 07/12/18 2238 07/12/18 2243 07/12/18 2330  BP: 129/62 (!) 145/122 132/71 (!) 144/76  Pulse: 74 81 75 73  Resp:  '16 17 18  '$ Temp:      TempSrc:      SpO2: 99% 98% 98% 100%  Weight:      Height:        Isolation Precautions No active isolations  Medications Medications  lisinopril (PRINIVIL,ZESTRIL) tablet 5 mg  (has no administration in time range)  donepezil (ARICEPT) tablet 10 mg (10 mg Oral Given 07/12/18 2303)  LORazepam (ATIVAN) tablet 0.5 mg (has no administration in  time range)  bisacodyl (DULCOLAX) suppository 10 mg (has no administration in time range)  pantoprazole (PROTONIX) EC tablet 40 mg (40 mg Oral Given 07/12/18 2303)  sucralfate (CARAFATE) tablet 1 g (1 g Oral Given 07/12/18 2303)  eucerin cream 1 application (has no administration in time range)  acetaminophen (TYLENOL) tablet 650 mg (has no administration in time range)    Or  acetaminophen (TYLENOL) suppository 650 mg (has no administration in time range)  ondansetron (ZOFRAN) tablet 4 mg (has no administration in time range)    Or  ondansetron (ZOFRAN) injection 4 mg (has no administration in time range)  enoxaparin (LOVENOX) injection 40 mg (has no administration in time range)  fentaNYL (SUBLIMAZE) injection 12.5 mcg (has no administration in time range)    Mobility walks with person assist

## 2018-07-12 NOTE — ED Notes (Addendum)
JACKIE/DAUGHTER HCPOA NOTIFIED HOSPICE OF PT'S CURRENT STATUS. DAUGHTER EXPRESSES CONCERN FOR COMFORT CARE ONLY FOR HER MOTHER.

## 2018-07-12 NOTE — Progress Notes (Signed)
[facility]) hospital liaison RN note.  Notified by [facility] triage nurse that patient was brought to ED for evaluation after a fall.  Facility did not notify hospice or family prior to calling EMS. Patient experienced an unwitnessed fall and was found on the floor in her room.  Visited with patient at bedside, daughter was present. Patient was in no apparent distress but did voice pain with movement of leg.  ED provider has requested an orthopedic consult for best treatment to manage pain and avoid surgery if possible at the request of patient's daughter.   Epic imaging: FINDINGS: Comminuted left femoral fracture is noted with impaction at the fracture site. IMPRESSION: Comminuted mid to distal left femoral fracture.  [facility] team notified.   [facility] will continue to follow patient while in ED and if hospitalized.  Please call with any hospice related questions or concerns.  Thank you,  Elsie Saas, RN, [facility] Liaison [facility] Liaisons are on [place]) [phone]

## 2018-07-12 NOTE — ED Notes (Signed)
Bed: WA03 Expected date:  Expected time:  Means of arrival:  Comments: EMS-fall-deformity 

## 2018-07-12 NOTE — ED Provider Notes (Signed)
[facility]-EMERGENCY DEPT Provider Note   CSN: [id number] Arrival date & time: 07/12/18  1305     History   Chief Complaint Chief Complaint  Patient presents with  . Fall  . Leg Injury  . leg deformity    HPI Tami Harris is a 84 y.o. female.  Level 5 caveat secondary to dementia.  Patient is a 84 year old on hospice for her decline in physical abilities.  She was found having fallen at her facility.  Hospice has been notified by her daughter who is her healthcare proxy.  Patient herself is unable to give any history.  She appears to be in pain when her left leg moves.  Daughter is here asking [place] to do a minimal amount of testing and to just provide her some comfort. The history is provided by a relative.  Fall  This is a new problem. The current episode started 1 to 2 hours ago. The problem has not changed since onset.Associated symptoms comments: Left leg pain. The symptoms are aggravated by bending. The symptoms are relieved by medications. She has tried rest for the symptoms. The treatment provided moderate relief.    Past Medical History:  Diagnosis Date  . 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 Allergic rhinitis   . Alzheimer's dementia (HCC) dx'd 2008  . Diverticulosis   . Fall   . Family history of adverse reaction to anesthesia    daughter reports "either faulty tube crimped or swelling from allergic reaction caused the tube to crimp"  . GERD (gastroesophageal reflux disease)   . Headache    "fragrant related"  . Hiatal hernia   . Memory loss   . Pneumonia 2013  . Radial fracture   . Seasonal allergies     Patient Active Problem List   Diagnosis Date Noted  . Advanced care planning/counseling discussion   . CAP (community acquired pneumonia) 04/23/2018  . Acute lower UTI 04/23/2018  . Altered mental status 06/25/2017  . Hypertension 06/25/2017  . GI bleed 03/28/2017  . Acute GI  bleeding 03/27/2017  . Hiatal hernia with GERD and esophagitis 03/27/2017  . Leucocytosis 03/27/2017  . Constipation 03/27/2017  . Intractable nausea and vomiting 03/27/2017  . Palliative care encounter   . Closed right hip fracture (HCC) 06/16/2016  . Hip fracture (HCC) 06/16/2016  . Goals of care, counseling/discussion   . Advance care planning   . Palliative care by specialist   . Fall 04/18/2016  . Distal radius fracture 04/18/2016  . Syncope 04/18/2016  . PVD (peripheral vascular disease) (HCC)   . Critical lower limb ischemia 04/08/2016  . 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 Pneumonia 10/18/2011  . Dementia (HCC) 10/18/2011    Past Surgical History:  Procedure Laterality Date  . BLADDER SUSPENSION  X 2  . CATARACT EXTRACTION W/ INTRAOCULAR LENS IMPLANT Right   . DILATION AND CURETTAGE OF UTERUS    . FEMUR IM NAIL Right 06/17/2016   Procedure: INTRAMEDULLARY (IM) NAIL FEMORAL;  Surgeon: Sheral Apley, MD;  Location: [facility] OR;  Service: Orthopedics;  Laterality: Right;  . INGUINAL HERNIA REPAIR Right   . PERIPHERAL VASCULAR CATHETERIZATION N/A 05/16/2016   Procedure: Lower Extremity Angiography;  Surgeon: Runell Gess, MD;  Location: [facility] INVASIVE CV LAB;  Service: Cardiovascular;  Laterality: N/A;     OB History   None      Home Medications    Prior to Admission medications  Medication Sig Start Date End Date Taking? Authorizing Provider  acetaminophen (TYLENOL) 325 MG tablet Take 2 tablets (650 mg total) by mouth every 6 (six) hours as needed for mild pain (or Fever >/= 101). 04/26/18  Yes Briant Cedar, MD  acetic acid 0.25 % irrigation Irrigate with 1 application as directed once a week. Apply to perineal area topically every Wednesday to wash area thoroughly   Yes [provider]  Cranberry 450 MG TABS Take 1 tablet by mouth 2 (two) times daily.   Yes [provider]  donepezil (ARICEPT) 10 MG tablet Take 10 mg by mouth at bedtime.    Yes [provider]  lisinopril (PRINIVIL,ZESTRIL) 5 MG tablet Take 5 mg by mouth daily.   Yes [provider]  loperamide (IMODIUM) 2 MG capsule Take 2 mg by mouth daily as needed for diarrhea or loose stools.   Yes [provider]  LORazepam (ATIVAN) 0.5 MG tablet Take 1 tablet (0.5 mg total) by mouth every 4 (four) hours as needed for anxiety. 04/26/18  Yes Briant Cedar, MD  pantoprazole (PROTONIX) 40 MG tablet Take 40 mg by mouth 2 (two) times daily.   Yes [provider]  Skin Protectants, Misc. (EUCERIN) cream Apply 1 application topically every Monday, Wednesday, and Friday. Pt applies to both feet.    Yes [provider]  sucralfate (CARAFATE) 1 g tablet Take 1 tablet (1 g total) by mouth 4 (four) times daily -  before meals and at bedtime. 04/26/18  Yes Briant Cedar, MD  bisacodyl (DULCOLAX) 10 MG suppository Place 1 suppository (10 mg total) rectally daily as needed for moderate constipation. 06/21/16   Richarda Overlie, MD    Family History Family History  Problem Relation Age of Onset  . Dementia Mother   . Heart failure Mother   . Heart failure Father   . Parkinsonism Brother     Social History Social History   Tobacco Use  . Smoking status: Never Smoker  . Smokeless tobacco: Never Used  Substance Use Topics  . Alcohol use: No    Alcohol/week: 0.0 standard drinks  . Drug use: No     Allergies   Other; Sulfa antibiotics; and Tape   Review of Systems Review of Systems  Unable to perform ROS: Dementia     Physical Exam Updated Vital Signs BP (!) 181/65 (BP Location: Left Arm)   Pulse 60   Temp 97.6 F (36.4 C) (Oral)   Resp 14   Ht 5\' 7"  (1.702 m)   Wt 65.8 kg   SpO2 99%   BMI 22.71 kg/m   Physical Exam  Constitutional: She appears well-developed and well-nourished.  HENT:  Head: Normocephalic and atraumatic.  Eyes: Conjunctivae are normal.  Neck: Neck supple.  Cardiovascular: Normal rate and  regular rhythm.  No murmur heard. Pulmonary/Chest: Effort normal and breath sounds normal. No respiratory distress.  Abdominal: Soft. There is no tenderness.  Musculoskeletal: She exhibits tenderness. She exhibits no edema.  Exam is limited secondary to this patient's participation.  No obvious deformities or tenderness when palpating her upper extremities.  Her left femur has some fullness in the mid area and is tender.  No obvious deformities of her left lower leg or her right leg.  Neurological: She is alert.  She spontaneously moving all 4 extremities.  She is not able to participate in exam.  Skin: Skin is warm and dry. Capillary refill takes less than 2 seconds.  Psychiatric: She  has a normal mood and affect.  Nursing note and vitals reviewed.    ED Treatments / Results  Labs (all labs ordered are listed, but only abnormal results are displayed) Labs Reviewed - No data to display  EKG None  Radiology No results found.  Procedures .Splint Application Date/Time: 07/12/2018 4:59 PM Performed by: Terrilee Files, MD Authorized by: Terrilee Files, MD   Consent:    Consent obtained:  Verbal   Consent given by:  Healthcare agent   Risks discussed:  Pain   Alternatives discussed:  No treatment Pre-procedure details:    Sensation:  Normal Procedure details:    Laterality:  Left   Location:  Leg   Leg:  L upper leg   Splint type:  Knee immobilizer Post-procedure details:    Pain:  Improved   Sensation:  Normal   Skin color:  Pink   Patient tolerance of procedure:  Tolerated well, no immediate complications Comments:     Assisted by ortho tech   (including critical care time)  Medications Ordered in ED Medications  fentaNYL (SUBLIMAZE) injection 50 mcg (has no administration in time range)     Initial Impression / Assessment and Plan / ED Course  I have reviewed the triage vital signs and the nursing notes.  Pertinent labs & imaging results that were  available during my care of the patient were reviewed by me and considered in my medical decision making (see chart for details).  Clinical Course as of Jul 12 2317  Thu Jul 12, 2018  1508 Patient's daughter healthcare proxy is here and is asking [place] to do nothing other than make the patient comfortable.  She is contacted hospice and it sounds like they are coming down to the emergency department to help assist in care.  I think it would be reasonable to get an x-ray of her leg to see where the fracture is and to potentially identify how we can keep her most comfortable.  Daughters in agreement with this plan.   [MB]  1549 Patient had a scout x-ray of the left femur which showed a displaced fracture of the femur.  I reviewed this with the daughter she is asking for how to best mobilize this area to limit pain.  Placed a call into orthopedics to review this with them.   [MB]  1614 Discussed with Dr. Eulah Pont from orthopedics who would recommend a knee immobilizer the patient does not desire any orthopedic interventions.   [MB]  1829 Patient's facility is unable to take her back as she is unable to ambulate.  Social work has been paged.  She will likely need to be admitted to the hospital for placement.   [MB]  1900 Discussed with social work who states they will be unable to place her until at least tomorrow.  Even then it sounds like it may be difficult she is not a rehab candidate.   [MB]  1916 Again to confirm with the daughter she does not want operative intervention and she is not agreeing to any blood work.  Her goal for the patient care is for keeping her comfortable and is little pain is possible.  I talked to Dr. Lars Masson from the hospitalist service for admission.   [MB]    Clinical Course User Index [MB] Terrilee Files, MD      Final Clinical Impressions(s) / ED Diagnoses   Final diagnoses:  Closed fracture of distal end of left femur, unspecified fracture morphology, initial  encounter [facility])    ED Discharge Orders    None       Terrilee Files, MD 07/12/18 2320

## 2018-07-12 NOTE — H&P (Signed)
History and Physical    CHANCE KARAM WUJ:[id number] DOB: 1923/12/04 DOA: 07/12/2018  PCP: Angela Cox, MD  Patient coming from: Assisted living facility.  Chief Complaint: Fall.  HPI: JACQUALINE WEICHEL is a 84 y.o. female with history of Alzheimer's dementia, hypertension had a fall at the living facility as per the daughter was unwitnessed.  Patient was trying to walk to the bathroom when she fell.  Patient was recently admitted in August 3 months ago for aspiration pneumonia and UTI.  Per daughter patient has been increasingly bedridden and does not ambulate much.  ED Course: In the ER x-rays reveal comminuted left mid to distal femur fracture.  At this time patient's daughter who is also the healthcare power of attorney advised just comfort measures and pain relief and no surgical intervention.  Patient is assisted living as advised higher level of care.  Patient admitted for further pain management.  Review of Systems: As per HPI, rest all negative.   Past Medical History:  Diagnosis Date  . 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 Allergic rhinitis   . Alzheimer's dementia (HCC) dx'd 2008  . Diverticulosis   . Fall   . Family history of adverse reaction to anesthesia    daughter reports "either faulty tube crimped or swelling from allergic reaction caused the tube to crimp"  . GERD (gastroesophageal reflux disease)   . Headache    "fragrant related"  . Hiatal hernia   . Memory loss   . Pneumonia 2013  . Radial fracture   . Seasonal allergies     Past Surgical History:  Procedure Laterality Date  . BLADDER SUSPENSION  X 2  . CATARACT EXTRACTION W/ INTRAOCULAR LENS IMPLANT Right   . DILATION AND CURETTAGE OF UTERUS    . FEMUR IM NAIL Right 06/17/2016   Procedure: INTRAMEDULLARY (IM) NAIL FEMORAL;  Surgeon: Sheral Apley, MD;  Location: [facility] OR;  Service: Orthopedics;  Laterality: Right;  . INGUINAL HERNIA  REPAIR Right   . PERIPHERAL VASCULAR CATHETERIZATION N/A 05/16/2016   Procedure: Lower Extremity Angiography;  Surgeon: Runell Gess, MD;  Location: [facility] INVASIVE CV LAB;  Service: Cardiovascular;  Laterality: N/A;     reports that she has never smoked. She has never used smokeless tobacco. She reports that she does not drink alcohol or use drugs.  Allergies  Allergen Reactions  . Other     SKIN MAY TEAR  . Sulfa Antibiotics Other (See Comments)    Reaction:  Unknown  Unable to state  . Tape Other (See Comments)    Reaction:  Tears pts skin     Family History  Problem Relation Age of Onset  . Dementia Mother   . Heart failure Mother   . Heart failure Father   . Parkinsonism Brother     Prior to Admission medications   Medication Sig Start Date End Date Taking? Authorizing Provider  acetaminophen (TYLENOL) 325 MG tablet Take 2 tablets (650 mg total) by mouth every 6 (six) hours as needed for mild pain (or Fever >/= 101). 04/26/18  Yes Briant Cedar, MD  acetic acid 0.25 % irrigation Irrigate with 1 application as directed once a week. Apply to perineal area topically every Wednesday to wash area thoroughly   Yes [provider]  Cranberry 450 MG TABS Take 1 tablet by mouth 2 (two) times daily.   Yes [provider]  donepezil (ARICEPT)  10 MG tablet Take 10 mg by mouth at bedtime.   Yes [provider]  lisinopril (PRINIVIL,ZESTRIL) 5 MG tablet Take 5 mg by mouth daily.   Yes [provider]  loperamide (IMODIUM) 2 MG capsule Take 2 mg by mouth daily as needed for diarrhea or loose stools.   Yes [provider]  LORazepam (ATIVAN) 0.5 MG tablet Take 1 tablet (0.5 mg total) by mouth every 4 (four) hours as needed for anxiety. 04/26/18  Yes Briant Cedar, MD  pantoprazole (PROTONIX) 40 MG tablet Take 40 mg by mouth 2 (two) times daily.   Yes [provider]  Skin Protectants, Misc. (EUCERIN) cream Apply 1 application  topically every Monday, Wednesday, and Friday. Pt applies to both feet.    Yes [provider]  sucralfate (CARAFATE) 1 g tablet Take 1 tablet (1 g total) by mouth 4 (four) times daily -  before meals and at bedtime. 04/26/18  Yes Briant Cedar, MD  bisacodyl (DULCOLAX) 10 MG suppository Place 1 suppository (10 mg total) rectally daily as needed for moderate constipation. 06/21/16   Richarda Overlie, MD    Physical Exam: Vitals:   07/12/18 2000 07/12/18 2030 07/12/18 2100 07/12/18 2130  BP: 139/69 140/75 136/61 129/62  Pulse: 74 85 72 74  Resp:  19    Temp:      TempSrc:      SpO2: 99% 99% 99% 99%  Weight:      Height:          Constitutional: Moderately built and nourished. Vitals:   07/12/18 2000 07/12/18 2030 07/12/18 2100 07/12/18 2130  BP: 139/69 140/75 136/61 129/62  Pulse: 74 85 72 74  Resp:  19    Temp:      TempSrc:      SpO2: 99% 99% 99% 99%  Weight:      Height:       Eyes: Anicteric no pallor. ENMT: No discharge from the ears eyes nose or fall. Neck: No mass felt.  No neck rigidity. Respiratory: No rhonchi or crepitations. Cardiovascular: S1-S2 heard no murmurs appreciated. Abdomen: Soft nontender bowel sounds present. Musculoskeletal: Left leg is in brace. Skin: No rash. Neurologic: Alert awake oriented to name and place.  Moves all extremities. Psychiatric: Has dementia.   Labs on Admission: I have personally reviewed following labs and imaging studies  CBC: No results for input(s): WBC, NEUTROABS, HGB, HCT, MCV, PLT in the last 168 hours. Basic Metabolic Panel: No results for input(s): NA, K, CL, CO2, GLUCOSE, BUN, CREATININE, CALCIUM, MG, PHOS in the last 168 hours. GFR: CrCl cannot be calculated (Patient's most recent lab result is older than the maximum 21 days allowed.). Liver Function Tests: No results for input(s): AST, ALT, ALKPHOS, BILITOT, PROT, ALBUMIN in the last 168 hours. No results for input(s): LIPASE, AMYLASE in the last  168 hours. No results for input(s): AMMONIA in the last 168 hours. Coagulation Profile: No results for input(s): INR, PROTIME in the last 168 hours. Cardiac Enzymes: No results for input(s): CKTOTAL, CKMB, CKMBINDEX, TROPONINI in the last 168 hours. BNP (last 3 results) No results for input(s): PROBNP in the last 8760 hours. HbA1C: No results for input(s): HGBA1C in the last 72 hours. CBG: No results for input(s): GLUCAP in the last 168 hours. Lipid Profile: No results for input(s): CHOL, HDL, LDLCALC, TRIG, CHOLHDL, LDLDIRECT in the last 72 hours. Thyroid Function Tests: No results for input(s): TSH, T4TOTAL, FREET4, T3FREE, THYROIDAB in the last 72  hours. Anemia Panel: No results for input(s): VITAMINB12, FOLATE, FERRITIN, TIBC, IRON, RETICCTPCT in the last 72 hours. Urine analysis:    Component Value Date/Time   COLORURINE AMBER (A) 04/23/2018 1823   APPEARANCEUR HAZY (A) 04/23/2018 1823   LABSPEC 1.026 04/23/2018 1823   PHURINE 5.0 04/23/2018 1823   GLUCOSEU NEGATIVE 04/23/2018 1823   HGBUR NEGATIVE 04/23/2018 1823   BILIRUBINUR NEGATIVE 04/23/2018 1823   KETONESUR NEGATIVE 04/23/2018 1823   PROTEINUR NEGATIVE 04/23/2018 1823   UROBILINOGEN 0.2 06/15/2011 1033   NITRITE NEGATIVE 04/23/2018 1823   LEUKOCYTESUR MODERATE (A) 04/23/2018 1823   Sepsis Labs: @LABRCNTIP (procalcitonin:4,lacticidven:4) )No results found for this or any previous visit (from the past 240 hour(s)).   Radiological Exams on Admission: Dg Femur 1 View Left  Result Date: 07/12/2018 CLINICAL DATA:  Recent fall with left leg pain and deformity, initial encounter EXAM: LEFT FEMUR 1 VIEW COMPARISON:  None. FINDINGS: Comminuted left femoral fracture is noted with impaction at the fracture site. IMPRESSION: Comminuted mid to distal left femoral fracture Electronically Signed   By: Alcide Clever M.D.   On: 07/12/2018 15:54     Assessment/Plan Principal Problem:   Fracture, femur closed, shaft  (HCC) Active Problems:   Dementia (HCC)   Hypertension    1. Left femur fracture status post fall -had extensive discussion with patient's daughter who is also healthcare power of attorney.  At this time patient's daughter has requested only comfort measures with pain relief.  I have discussed with patient's daughter that if plans are changed and request surgical intervention then we will consult orthopedics.  For now I have placed patient on fentanyl as needed for pain and requested social work and physical therapy consult. 2. Hypertension on lisinopril. 3. Dementia on Aricept. 4. Normocytic normochromic anemia appears to be new likely from blood loss from fracture.  At this time patient daughter also has requested no aggressive measures including blood work.   DVT prophylaxis: Lovenox. Code Status: DNR. Family Communication: Patient's daughter. Disposition Plan: To be determined. Consults called: Physical therapy and social work. Admission status: Observation.   Eduard Clos MD [facility] Pager [phone].  If 7PM-7AM, please contact night-coverage [email or web address] Password [email or web address]  07/12/2018, 10:01 PM

## 2018-07-13 DIAGNOSIS — S72402A Unspecified fracture of lower end of left femur, initial encounter for closed fracture: Principal | ICD-10-CM

## 2018-07-13 DIAGNOSIS — F0280 Dementia in other diseases classified elsewhere without behavioral disturbance: Secondary | ICD-10-CM

## 2018-07-13 DIAGNOSIS — Z66 Do not resuscitate: Secondary | ICD-10-CM | POA: Diagnosis present

## 2018-07-13 DIAGNOSIS — Z961 Presence of intraocular lens: Secondary | ICD-10-CM | POA: Diagnosis present

## 2018-07-13 DIAGNOSIS — K219 Gastro-esophageal reflux disease without esophagitis: Secondary | ICD-10-CM | POA: Diagnosis present

## 2018-07-13 DIAGNOSIS — Z882 Allergy status to sulfonamides status: Secondary | ICD-10-CM | POA: Diagnosis not present

## 2018-07-13 DIAGNOSIS — Z79899 Other long term (current) drug therapy: Secondary | ICD-10-CM | POA: Diagnosis not present

## 2018-07-13 DIAGNOSIS — Z87440 Personal history of urinary (tract) infections: Secondary | ICD-10-CM | POA: Diagnosis not present

## 2018-07-13 DIAGNOSIS — Z515 Encounter for palliative care: Secondary | ICD-10-CM | POA: Diagnosis present

## 2018-07-13 DIAGNOSIS — Z8701 Personal history of pneumonia (recurrent): Secondary | ICD-10-CM | POA: Diagnosis not present

## 2018-07-13 DIAGNOSIS — Z820 Family history of epilepsy and other diseases of the nervous system: Secondary | ICD-10-CM | POA: Diagnosis not present

## 2018-07-13 DIAGNOSIS — Z9109 Other allergy status, other than to drugs and biological substances: Secondary | ICD-10-CM | POA: Diagnosis not present

## 2018-07-13 DIAGNOSIS — H353220 Exudative age-related macular degeneration, left eye, stage unspecified: Secondary | ICD-10-CM | POA: Diagnosis present

## 2018-07-13 DIAGNOSIS — D649 Anemia, unspecified: Secondary | ICD-10-CM | POA: Diagnosis present

## 2018-07-13 DIAGNOSIS — W1830XA Fall on same level, unspecified, initial encounter: Secondary | ICD-10-CM | POA: Diagnosis present

## 2018-07-13 DIAGNOSIS — Z9841 Cataract extraction status, right eye: Secondary | ICD-10-CM | POA: Diagnosis not present

## 2018-07-13 DIAGNOSIS — I10 Essential (primary) hypertension: Secondary | ICD-10-CM

## 2018-07-13 DIAGNOSIS — Y9301 Activity, walking, marching and hiking: Secondary | ICD-10-CM | POA: Diagnosis present

## 2018-07-13 DIAGNOSIS — Y92098 Other place in other non-institutional residence as the place of occurrence of the external cause: Secondary | ICD-10-CM | POA: Diagnosis not present

## 2018-07-13 DIAGNOSIS — H5440 Blindness, one eye, unspecified eye: Secondary | ICD-10-CM | POA: Diagnosis present

## 2018-07-13 DIAGNOSIS — S72302A Unspecified fracture of shaft of left femur, initial encounter for closed fracture: Secondary | ICD-10-CM

## 2018-07-13 DIAGNOSIS — G309 Alzheimer's disease, unspecified: Secondary | ICD-10-CM

## 2018-07-13 DIAGNOSIS — Z7401 Bed confinement status: Secondary | ICD-10-CM | POA: Diagnosis not present

## 2018-07-13 LAB — MRSA PCR SCREENING: MRSA BY PCR: NEGATIVE

## 2018-07-13 MED ORDER — ENSURE ENLIVE PO LIQD
237.0000 mL | Freq: Two times a day (BID) | ORAL | Status: DC
  Administered 2018-07-14 (×2): 237 mL via ORAL

## 2018-07-13 MED ORDER — POLYETHYLENE GLYCOL 3350 17 G PO PACK
17.0000 g | PACK | Freq: Every day | ORAL | Status: DC
  Administered 2018-07-13: 17 g via ORAL
  Filled 2018-07-13 (×2): qty 1

## 2018-07-13 MED ORDER — MORPHINE SULFATE 15 MG PO TABS
15.0000 mg | ORAL_TABLET | ORAL | Status: DC | PRN
  Administered 2018-07-13 – 2018-07-14 (×3): 15 mg via ORAL
  Filled 2018-07-13 (×3): qty 1

## 2018-07-13 MED ORDER — ADULT MULTIVITAMIN W/MINERALS CH: 1.0000 | ORAL_TABLET | Freq: Every day | ORAL | Status: DC

## 2018-07-13 NOTE — ED Notes (Signed)
Tried to call report, RN on floor just got a patient from PACU and wouldn't take report. Was told will call me back

## 2018-07-13 NOTE — Progress Notes (Signed)
Palliative Medicine RN Note: Consult order noted.  Patient is an active GIP admission for [facility]. I have spoken with their liason Victorino Dike, who reports that they are managing discharge planning and GOC. They do not feel there is a role for PMT and will call us if that changes.   Updated Dr Neale Burly. Referral will be cancelled.  Margret Chance Nishawn Rotan, RN, BSN, [facility] Palliative Medicine Team 07/13/2018 2:54 PM Office [phone]

## 2018-07-13 NOTE — Progress Notes (Signed)
PT Note  Patient Details Name: Tami Harris MRN: [id number] DOB: August 16, 1924   Cancelled Treatment:     PT order received but eval deferred 2* pt current status.  Pt with significant dementia and ambulated minimally prior to this fall.  At this time, pt has significant displaced fx or distal femur and daughter wishes "to maintain patient comfort without aggressive therapies or surgery".  Pt is not a rehab candidate at this time and PT to sign off.    Leonette Tischer 07/13/2018, 12:09 PM

## 2018-07-13 NOTE — Progress Notes (Signed)
[facility] PROGRESS NOTE    Progress Note  Tami Harris  ZOX:[id number] DOB: 08/30/1924 DOA: 07/12/2018 PCP: Angela Cox, MD     Brief Narrative:   Tami Harris is an 84 y.o. female 84 year old female with past medical history of Alzheimer's dementia, hypertension who resides in the assisted living facility had an unwitnessed fall according to daughter, patient was recently admitted to the hospital in August for aspiration pneumonia and UTI.  She has been mostly bedridden since then, and x-ray of her left hip shift show a left mid and distal female fracture.  Assessment/Plan:   Fracture, femur closed, shaft (HCC): The daughter who is the healthcare power of attorney requested comfort measures and pain relief.  She would not desire further surgical intervention. We will continue narcotics stool softeners we will get social worker for placement. I did speak to her this morning and she would like to go to the comfort route. We will try to get her to a skilled nursing facility with palliative care and then transition to hospice care. Add morphine tabs for pain control use fentanyl as needed for severe pain or breakthrough pain when the oral morphine is not working.  Alzheimer's dementia [facility]): Continue current medications no changes made.  Essential hypertension: Mildly elevated in the setting of pain continue lisinopril and will monitor.  Normocytic anemia: Mild, will continue to monitor closely.   DVT prophylaxis: scd Family Communication:daughter Disposition Plan/Barrier to D/C: snf Code Status:     Code Status Orders  (From admission, onward)         Start     Ordered   07/12/18 2158  Do not attempt resuscitation (DNR)  Continuous    Question Answer Comment  In the event of cardiac or respiratory ARREST Do not call a "code blue"   In the event of cardiac or respiratory ARREST Do not perform Intubation, CPR, defibrillation or ACLS   In the event  of cardiac or respiratory ARREST Use medication by any route, position, wound care, and other measures to relive pain and suffering. May use oxygen, suction and manual treatment of airway obstruction as needed for comfort.      07/12/18 2200        Code Status History    Date Active Date Inactive Code Status Order ID Comments User Context   07/12/2018 2001 07/12/2018 2200 DNR [id number]  Eduard Clos, MD ED   04/25/2018 1536 04/26/2018 1726 DNR [id number]  Barbara Cower, NP Inpatient   04/23/2018 1950 04/25/2018 1535 DNR [id number]  Hillary Bow, DO ED   06/25/2017 2142 06/26/2017 2016 DNR [id number]  Pearson Grippe, MD Inpatient   06/25/2017 2015 06/25/2017 2142 DNR [id number]  Marily Memos, MD ED   03/27/2017 0843 03/29/2017 1759 DNR [id number]  Rolly Salter, MD Inpatient   06/16/2016 0527 06/22/2016 2308 DNR [id number]  Eduard Clos, MD ED   05/16/2016 1227 05/16/2016 2004 Full Code [id number]  Runell Gess, MD Inpatient   04/18/2016 1050 04/20/2016 1832 DNR [id number]  Gwenyth Bender, NP ED   04/18/2016 1049 04/18/2016 1050 Full Code [id number]  Gwenyth Bender, NP ED   10/18/2011 1752 10/19/2011 2023 DNR [id number]  Standley Brooking, MD Inpatient    Advance Directive Documentation     Most Recent Value  Type of Advance Directive  Out of facility DNR (pink MOST or yellow form), Healthcare Power of Attorney, Living will  Pre-existing out of facility DNR order (yellow form or  pink MOST form)  Yellow form placed in chart (order not valid for inpatient use), Pink MOST form placed in chart (order not valid for inpatient use)  "MOST" Form in Place?  -        IV Access:    Peripheral IV   Procedures and diagnostic studies:   Dg Femur 1 View Left  Result Date: 07/12/2018 CLINICAL DATA:  Recent fall with left leg pain and deformity, initial encounter EXAM: LEFT FEMUR 1 VIEW COMPARISON:  None. FINDINGS: Comminuted left femoral fracture is noted with impaction at the fracture site. IMPRESSION:  Comminuted mid to distal left femoral fracture Electronically Signed   By: Alcide Clever M.D.   On: 07/12/2018 15:54     Medical Consultants:    None.  Anti-Infectives:   None  Subjective:    Tami Harris patient is lying in bed comfortably.  Objective:    Vitals:   07/13/18 0030 07/13/18 0045 07/13/18 0115 07/13/18 0616  BP: 135/63  (!) 148/60 (!) 143/66  Pulse: 72 74 69 69  Resp: 18  20 19   Temp:   98.4 F (36.9 C) 97.6 F (36.4 C)  TempSrc:   Oral Oral  SpO2: 98% 98% 96% 99%  Weight:      Height:       No intake or output data in the 24 hours ending 07/13/18 0801 Filed Weights   07/12/18 1421  Weight: 65.8 kg    Exam: General exam: In no acute distress. Respiratory system: Good air movement and clear to auscultation. Cardiovascular system: S1 & S2 heard, RRR.  Gastrointestinal system: Abdomen is nondistended, soft and nontender.  Central nervous system: Alert and oriented. No focal neurological deficits. Extremities: Left lower extremity is shorter than the right. Skin: No rashes, lesions or ulcers    Data Reviewed:    Labs: Basic Metabolic Panel: Recent Labs  Lab 07/12/18 2310  CREATININE 0.62   GFR Estimated Creatinine Clearance: 43.6 mL/min (by C-G formula based on SCr of 0.62 mg/dL). Liver Function Tests: No results for input(s): AST, ALT, ALKPHOS, BILITOT, PROT, ALBUMIN in the last 168 hours. No results for input(s): LIPASE, AMYLASE in the last 168 hours. No results for input(s): AMMONIA in the last 168 hours. Coagulation profile No results for input(s): INR, PROTIME in the last 168 hours.  CBC: Recent Labs  Lab 07/12/18 2310  WBC 10.1  HGB 11.9*  HCT 36.5  MCV 94.1  PLT 262   Cardiac Enzymes: No results for input(s): CKTOTAL, CKMB, CKMBINDEX, TROPONINI in the last 168 hours. BNP (last 3 results) No results for input(s): PROBNP in the last 8760 hours. CBG: No results for input(s): GLUCAP in the last 168  hours. D-Dimer: No results for input(s): DDIMER in the last 72 hours. Hgb A1c: No results for input(s): HGBA1C in the last 72 hours. Lipid Profile: No results for input(s): CHOL, HDL, LDLCALC, TRIG, CHOLHDL, LDLDIRECT in the last 72 hours. Thyroid function studies: No results for input(s): TSH, T4TOTAL, T3FREE, THYROIDAB in the last 72 hours.  Invalid input(s): FREET3 Anemia work up: No results for input(s): VITAMINB12, FOLATE, FERRITIN, TIBC, IRON, RETICCTPCT in the last 72 hours. Sepsis Labs: Recent Labs  Lab 07/12/18 2310  WBC 10.1   Microbiology Recent Results (from the past 240 hour(s))  MRSA PCR Screening     Status: None   Collection Time: 07/13/18  5:45 AM  Result Value Ref Range Status   MRSA by PCR NEGATIVE NEGATIVE Final    Comment:  The GeneXpert MRSA Assay (FDA approved for NASAL specimens only), is one component of a comprehensive MRSA colonization surveillance program. It is not intended to diagnose MRSA infection nor to guide or monitor treatment for MRSA infections. Performed at [facility], [address] [place], [place] [address]      Medications:   . donepezil  10 mg Oral QHS  . enoxaparin (LOVENOX) injection  40 mg Subcutaneous Q24H  . hydrocerin  1 application Topical Q M,W,F  . lisinopril  5 mg Oral Daily  . pantoprazole  40 mg Oral BID  . sucralfate  1 g Oral TID AC & HS   Continuous Infusions:    LOS: 0 days   Marinda Elk  [facility] Pager [phone]  *Please refer to [email or web address], password [email or web address] to get updated schedule on who will round on this patient, as hospitalists switch teams weekly. If 7PM-7AM, please contact night-coverage at [email or web address], password [email or web address] for any overnight needs.  07/13/2018, 8:01 AM

## 2018-07-13 NOTE — Progress Notes (Signed)
[facility]5- [facility] ([facility]) GIP RN visit.  This is a related and covered GIP admission of 05/01/2018 with [facility] diagnosis of Alzheimer's disease per Dr. Jamie Brookes. Patient has OOF DNR. [facility] activated EMS after patient experienced and unwitnessed fall in her room. Patient was admitted with left Comminuted mid to distal left femoral fracture  Visited with patient at bedside, daughter, Annice Pih was present. Patient is alert but confused. Does not appear in distress or pain. Daughter states she is more lucid this morning but at base level of disorientation. She has a knee immobilizer in place.  VS: BP 117/83, HR 75, SpO2 97% on RA, afebrile. She has a PureWick catheter draining clear yellow urine.   Day 1 of [facility] GIP   Per MD notes:  CSW consult was obtained for skilled nursing placement as patient will need a higher level of care than assisted living at discharge.  Medications: No continuous medications. Fentanyl 12.11mcg IV Q2hrs PRN given at 0055, [id number], [id number].  Goals of Care: DNR  With comfort care only. Daughter wishes to maintain patients comfort without aggressive therapies or surgery.  Discharge Planning: CSW consult was obtained to assist with skilled nursing home placement.   Communication with [facility]: Spoke with daughter at bedside.  Communication with [facility]: [facility] team has been updated.  Please call with any hospice related questions or concerns. Please use [facility] for transport at time of discharge.   Thank you,  Elsie Saas, RN, [facility] Liaison [facility] Liaisons are listed on AMION) [phone]

## 2018-07-13 NOTE — Progress Notes (Signed)
Nutrition Follow-up  DOCUMENTATION CODES:   Not applicable  INTERVENTION:  - Will order Ensure Enlive po BID, each supplement provides 350 kcal and 20 grams of protein - Will order daily multivitamin with minerals.    NUTRITION DIAGNOSIS:   Inadequate oral intake related to acute illness, chronic illness(bed bound status x2 months; dementia) as evidenced by per patient/family report, meal completion < 25%.  GOAL:   Patient will meet greater than or equal to 90% of their needs  MONITOR:   PO intake, Supplement acceptance, Weight trends, Labs  REASON FOR ASSESSMENT:   Malnutrition Screening Tool  ASSESSMENT:   84 y.o. female 84 year old female with past medical history of Alzheimer's dementia and HTN. She resides in an ALF and had an unwitnessed fall, according to her daughter. Patient was recently admitted to the hospital in August for aspiration PNA and UTI.  She has been mostly bedridden since then. An x-ray of her L hip showed a L- mid and distal female fracture.  BMI indicates normal weight. Per chart review, patient is a/o to self only; unable to provide information at this time and no family/visitors present at the time of RD visit. Patient was at [facility] earlier in the month and during the time of RD visit that admission, patient was sleeping soundly and no family/visitors present at that time either. Per chart review, patient consumed 10% of a late breakfast today.  Per Dr. Louanne Belton note this AM: closed L femur fx with daughter requesting comfort measures and no surgical intervention. Plan is for SNF placement with Palliative Care and then transition to hospice care at that time.    Medications reviewed; 40 mg Protonix BID, 1 packet Miralax/day, 1 g Carafate TID.  Labs reviewed.    NUTRITION - FOCUSED PHYSICAL EXAM:  Completed; no muscle and no fat wasting noted at this time.  Diet Order:   Diet Order            DIET DYS 3 Room service appropriate? Yes; Fluid  consistency: Thin  Diet effective now              EDUCATION NEEDS:   No education needs have been identified at this time  Skin:  Skin Assessment: Reviewed RN Assessment  Last BM:  10/24  Height:   Ht Readings from Last 1 Encounters:  07/12/18 5\' 7"  (1.702 m)    Weight:   Wt Readings from Last 1 Encounters:  07/12/18 65.8 kg    Ideal Body Weight:  61.36 kg  BMI:  Body mass index is 22.71 kg/m.  Estimated Nutritional Needs:   Kcal:  1400-1600  Protein:  55-65 grams  Fluid:  >/= 1.6L/day     Trenton Gammon, MS, RD, LDN, CNSC Inpatient Clinical Dietitian Pager # [phone] After hours/weekend pager # [phone]

## 2018-07-14 MED ORDER — MORPHINE SULFATE 15 MG PO TABS
15.0000 mg | ORAL_TABLET | ORAL | Status: DC
  Filled 2018-07-14 (×3): qty 1

## 2018-07-14 MED ORDER — MORPHINE SULFATE 15 MG PO TABS: 15.0000 mg | ORAL_TABLET | ORAL | 0 refills | Status: AC | PRN

## 2018-07-14 MED ORDER — HYDROXYZINE HCL 10 MG PO TABS
10.0000 mg | ORAL_TABLET | Freq: Three times a day (TID) | ORAL | Status: DC | PRN
  Filled 2018-07-14: qty 1

## 2018-07-14 MED ORDER — SCOPOLAMINE 1 MG/3DAYS TD PT72
1.0000 | MEDICATED_PATCH | TRANSDERMAL | Status: DC
  Administered 2018-07-14: 1.5 mg via TRANSDERMAL
  Filled 2018-07-14: qty 1

## 2018-07-14 MED ORDER — METHOCARBAMOL 500 MG PO TABS: 500.0000 mg | ORAL_TABLET | Freq: Four times a day (QID) | ORAL | 0 refills | Status: AC | PRN

## 2018-07-14 MED ORDER — LIP MEDEX EX OINT
TOPICAL_OINTMENT | CUTANEOUS | Status: AC
  Administered 2018-07-14: 1
  Filled 2018-07-14: qty 7

## 2018-07-14 MED ORDER — LORAZEPAM 0.5 MG PO TABS: 0.5000 mg | ORAL_TABLET | ORAL | 0 refills | Status: AC | PRN

## 2018-07-14 MED ORDER — METHOCARBAMOL 500 MG PO TABS
500.0000 mg | ORAL_TABLET | Freq: Four times a day (QID) | ORAL | Status: DC | PRN
  Administered 2018-07-14 – 2018-07-15 (×2): 500 mg via ORAL
  Filled 2018-07-14 (×2): qty 1

## 2018-07-14 MED ORDER — LORAZEPAM 2 MG/ML IJ SOLN
0.5000 mg | Freq: Once | INTRAMUSCULAR | Status: AC
  Administered 2018-07-14: 0.5 mg via INTRAVENOUS
  Filled 2018-07-14: qty 1

## 2018-07-14 MED ORDER — HALOPERIDOL LACTATE 5 MG/ML IJ SOLN: 1.0000 mg | Freq: Four times a day (QID) | INTRAMUSCULAR | Status: DC | PRN

## 2018-07-14 MED ORDER — POLYETHYLENE GLYCOL 3350 17 G PO PACK: 17.0000 g | PACK | Freq: Every day | ORAL | 0 refills | Status: AC

## 2018-07-14 MED ORDER — MORPHINE SULFATE (PF) 2 MG/ML IV SOLN
2.0000 mg | INTRAVENOUS | Status: DC | PRN
  Administered 2018-07-14 – 2018-07-15 (×6): 2 mg via INTRAVENOUS
  Filled 2018-07-14 (×6): qty 1

## 2018-07-14 NOTE — NC FL2 (Signed)
MEDICAID FL2 LEVEL OF CARE SCREENING TOOL     IDENTIFICATION  Patient Name: Tami Harris Birthdate: 1924/08/01 Sex: female Admission Date (Current Location): 07/12/2018  [facility] and [place] Number:  Producer, television/film/video and Address:  [facility],  [address] [place], [place] [address]      Provider Number: [id number]  Attending Physician Name and Address:  Marinda Elk, MD  Relative Name and Phone Number:  Cipriano Mile: [phone]    Current Level of Care: Hospital Recommended Level of Care: Skilled Nursing Facility Prior Approval Number:    Date Approved/Denied:   PASRR Number: [id number]  Discharge Plan: SNF    Current Diagnoses: Patient Active Problem List   Diagnosis Date Noted  . Fracture, femur closed, shaft (HCC) 07/12/2018  . Advanced care planning/counseling discussion   . CAP (community acquired pneumonia) 04/23/2018  . Acute lower UTI 04/23/2018  . Altered mental status 06/25/2017  . Hypertension 06/25/2017  . GI bleed 03/28/2017  . Acute GI bleeding 03/27/2017  . Hiatal hernia with GERD and esophagitis 03/27/2017  . Leucocytosis 03/27/2017  . Constipation 03/27/2017  . Intractable nausea and vomiting 03/27/2017  . Palliative care encounter   . Closed right hip fracture (HCC) 06/16/2016  . Hip fracture (HCC) 06/16/2016  . Goals of care, counseling/discussion   . Advance care planning   . Palliative care by specialist   . Fall 04/18/2016  . Distal radius fracture 04/18/2016  . Syncope 04/18/2016  . PVD (peripheral vascular disease) (HCC)   . Critical lower limb ischemia 04/08/2016  . 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 Pneumonia 10/18/2011  . Dementia (HCC) 10/18/2011    Orientation RESPIRATION BLADDER Height & Weight     Self  Normal Incontinent Weight: 145 lb (65.8 kg) Height:  5\' 7"  (170.2 cm)  BEHAVIORAL SYMPTOMS/MOOD NEUROLOGICAL BOWEL NUTRITION STATUS      Incontinent Diet(Soft diet)   AMBULATORY STATUS COMMUNICATION OF NEEDS Skin   Extensive Assist Verbally Normal                       Personal Care Assistance Level of Assistance  Bathing, Feeding, Dressing Bathing Assistance: Maximum assistance Feeding assistance: Limited assistance Dressing Assistance: Maximum assistance     Functional Limitations Info  Sight, Hearing, Speech Sight Info: Adequate Hearing Info: Adequate Speech Info: Adequate    SPECIAL CARE FACTORS FREQUENCY  (Hospice following)                    Contractures Contractures Info: Not present    Additional Factors Info  Code Status, Allergies, Psychotropic Code Status Info: DNR Allergies Info: OTHER, SULFA ANTIBIOTICS, TAPE  Psychotropic Info: PRN: Haldol, Ativan         Current Medications (07/14/2018):  This is the current hospital active medication list Current Facility-Administered Medications  Medication Dose Route Frequency Provider Last Rate Last Dose  . acetaminophen (TYLENOL) tablet 650 mg  650 mg Oral Q6H PRN Eduard Clos, MD   650 mg at 07/13/18 1308   Or  . acetaminophen (TYLENOL) suppository 650 mg  650 mg Rectal Q6H PRN Eduard Clos, MD      . bisacodyl (DULCOLAX) suppository 10 mg  10 mg Rectal Daily PRN Eduard Clos, MD      . donepezil (ARICEPT) tablet 10 mg  10 mg Oral QHS Eduard Clos, MD   10 mg at 07/13/18 2113  . feeding supplement (ENSURE ENLIVE) (ENSURE ENLIVE) liquid 237  mL  237 mL Oral BID BM Marinda Elk, MD   237 mL at 07/14/18 1020  . fentaNYL (SUBLIMAZE) injection 12.5 mcg  12.5 mcg Intravenous Q2H PRN Eduard Clos, MD   12.5 mcg at 07/13/18 0516  . haloperidol lactate (HALDOL) injection 1 mg  1 mg Intravenous Q6H PRN Marinda Elk, MD      . hydrocerin (EUCERIN) cream 1 application  1 application Topical Q M,W,F Eduard Clos, MD   1 application at 07/13/18 1000  . hydrOXYzine (ATARAX/VISTARIL) tablet 10 mg  10 mg Oral TID PRN Marinda Elk, MD      . methocarbamol (ROBAXIN) tablet 500 mg  500 mg Oral Q6H PRN Marinda Elk, MD   500 mg at 07/14/18 1020  . morphine (MSIR) tablet 15 mg  15 mg Oral Q4H Marinda Elk, MD      . ondansetron [facility]) tablet 4 mg  4 mg Oral Q6H PRN Eduard Clos, MD       Or  . ondansetron [facility]) injection 4 mg  4 mg Intravenous Q6H PRN Eduard Clos, MD      . polyethylene glycol (MIRALAX / GLYCOLAX) packet 17 g  17 g Oral Daily Marinda Elk, MD   17 g at 07/13/18 1044     Discharge Medications: Please see discharge summary for a list of discharge medications.  Relevant Imaging Results:  Relevant Lab Results:   Additional Information SSN: [id number]  Enid Cutter, [place]

## 2018-07-14 NOTE — Discharge Summary (Signed)
Physician Discharge Summary  Tami Harris:[id number] DOB: 11-25-1923 DOA: 07/12/2018  PCP: Angela Cox, MD  Admit date: 07/12/2018 Discharge date: 07/14/2018  Admitted From: SNF Disposition:  SNF with hospice   Recommendations for Outpatient Follow-up:  1. Hospice and palliative care [place] to follow at skilled nursing facility.  Home Health:No Quipment/Devices:none  Discharge Condition:Guarded CODE STATUS:DNR Diet recommendation: Heart Healthy  Brief/Interim Summary: This is a 84 year old with past medical history of Alzheimer's dementia advanced/hypertension who resides at an assisted living facility who had an unwitnessed fall according to daughter patient was recently admitted to the hospital on August for aspiration pneumonia which is recurrent and UTI and since then she has been bedridden, and x-ray of the left hip was done that showed a left mid and distal femur fracture.   Discharge Diagnoses:  Principal Problem:   Fracture, femur closed, shaft (HCC) Active Problems:   Dementia (HCC)   Hypertension Left femur closed fracture: The daughter who is healthcare power of attorney did not want an aggressive intervention, she decided towards comfort measure with pain relief. The patient at baseline is severely demented as has recurrent aspiration. She was placed on narcotics which she required minimal, she was also put on MiraLAX for stool softeners and she remained stable. Social worker was consulted for skilled nursing facility placement with hospice to follow-up at facility.   Alzheimer's dementia: Continue current medications no changes were made.  Essential hypertension: Seems to be stable will discontinue lisinopril.  Normocytic anemia: Mild.   Discharge Instructions  Discharge Instructions    Diet - low sodium heart healthy   Complete by:  As directed    Increase activity slowly   Complete by:  As directed      Allergies as of  07/14/2018      Reactions   Other    SKIN MAY TEAR   Sulfa Antibiotics Other (See Comments)   Reaction:  Unknown  Unable to state   Tape Other (See Comments)   Reaction:  Tears pts skin       Medication List    STOP taking these medications   acetaminophen 325 MG tablet Commonly known as:  TYLENOL   acetic acid 0.25 % irrigation   Cranberry 450 MG Tabs   eucerin cream   lisinopril 5 MG tablet Commonly known as:  PRINIVIL,ZESTRIL   loperamide 2 MG capsule Commonly known as:  IMODIUM     TAKE these medications   bisacodyl 10 MG suppository Commonly known as:  DULCOLAX Place 1 suppository (10 mg total) rectally daily as needed for moderate constipation.   donepezil 10 MG tablet Commonly known as:  ARICEPT Take 10 mg by mouth at bedtime.   LORazepam 0.5 MG tablet Commonly known as:  ATIVAN Take 1 tablet (0.5 mg total) by mouth every 4 (four) hours as needed for anxiety.   morphine 15 MG tablet Commonly known as:  MSIR Take 1 tablet (15 mg total) by mouth every 4 (four) hours as needed for severe pain.   pantoprazole 40 MG tablet Commonly known as:  PROTONIX Take 40 mg by mouth 2 (two) times daily.   polyethylene glycol packet Commonly known as:  MIRALAX / GLYCOLAX Take 17 g by mouth daily.   sucralfate 1 g tablet Commonly known as:  CARAFATE Take 1 tablet (1 g total) by mouth 4 (four) times daily -  before meals and at bedtime.       Allergies  Allergen Reactions  . Other  SKIN MAY TEAR  . Sulfa Antibiotics Other (See Comments)    Reaction:  Unknown  Unable to state  . Tape Other (See Comments)    Reaction:  Tears pts skin     Consultations:  None   Procedures/Studies: Dg Femur 1 View Left  Result Date: 07/12/2018 CLINICAL DATA:  Recent fall with left leg pain and deformity, initial encounter EXAM: LEFT FEMUR 1 VIEW COMPARISON:  None. FINDINGS: Comminuted left femoral fracture is noted with impaction at the fracture site. IMPRESSION:  Comminuted mid to distal left femoral fracture Electronically Signed   By: Alcide Clever M.D.   On: 07/12/2018 15:54     Subjective: No compalins, has required minimal narcotics  Discharge Exam: Vitals:   07/13/18 2232 07/14/18 0503  BP: 130/65 (!) 120/51  Pulse: (!) 103 77  Resp: 17   Temp: (!) 97.4 F (36.3 C) 98.3 F (36.8 C)  SpO2: 92% 92%   Vitals:   07/13/18 1338 07/13/18 1833 07/13/18 2232 07/14/18 0503  BP: (!) 143/77 (!) 113/57 130/65 (!) 120/51  Pulse: 76 76 (!) 103 77  Resp: 16 16 17    Temp: (!) 96.5 F (35.8 C) 97.6 F (36.4 C) (!) 97.4 F (36.3 C) 98.3 F (36.8 C)  TempSrc: Axillary Oral Oral Oral  SpO2: 97% 94% 92% 92%  Weight:      Height:        General: Pt is alert, awake, not in acute distress Cardiovascular: RRR, S1/S2 +, no rubs, no gallops Respiratory: CTA bilaterally, no wheezing, no rhonchi Abdominal: Soft, NT, ND, bowel sounds + Extremities: no edema, no cyanosis    The results of significant diagnostics from this hospitalization (including imaging, microbiology, ancillary and laboratory) are listed below for reference.     Microbiology: Recent Results (from the past 240 hour(s))  MRSA PCR Screening     Status: None   Collection Time: 07/13/18  5:45 AM  Result Value Ref Range Status   MRSA by PCR NEGATIVE NEGATIVE Final    Comment:        The GeneXpert MRSA Assay (FDA approved for NASAL specimens only), is one component of a comprehensive MRSA colonization surveillance program. It is not intended to diagnose MRSA infection nor to guide or monitor treatment for MRSA infections. Performed at [facility], [address], [place], [place] [address]      Labs: BNP (last 3 results) No results for input(s): BNP in the last 8760 hours. Basic Metabolic Panel: Recent Labs  Lab 07/12/18 2310  CREATININE 0.62   Liver Function Tests: No results for input(s): AST, ALT, ALKPHOS, BILITOT, PROT, ALBUMIN in the last  168 hours. No results for input(s): LIPASE, AMYLASE in the last 168 hours. No results for input(s): AMMONIA in the last 168 hours. CBC: Recent Labs  Lab 07/12/18 2310  WBC 10.1  HGB 11.9*  HCT 36.5  MCV 94.1  PLT 262   Cardiac Enzymes: No results for input(s): CKTOTAL, CKMB, CKMBINDEX, TROPONINI in the last 168 hours. BNP: Invalid input(s): POCBNP CBG: No results for input(s): GLUCAP in the last 168 hours. D-Dimer No results for input(s): DDIMER in the last 72 hours. Hgb A1c No results for input(s): HGBA1C in the last 72 hours. Lipid Profile No results for input(s): CHOL, HDL, LDLCALC, TRIG, CHOLHDL, LDLDIRECT in the last 72 hours. Thyroid function studies No results for input(s): TSH, T4TOTAL, T3FREE, THYROIDAB in the last 72 hours.  Invalid input(s): FREET3 Anemia work up No results for input(s):  VITAMINB12, FOLATE, FERRITIN, TIBC, IRON, RETICCTPCT in the last 72 hours. Urinalysis    Component Value Date/Time   COLORURINE AMBER (A) 04/23/2018 1823   APPEARANCEUR HAZY (A) 04/23/2018 1823   LABSPEC 1.026 04/23/2018 1823   PHURINE 5.0 04/23/2018 1823   GLUCOSEU NEGATIVE 04/23/2018 1823   HGBUR NEGATIVE 04/23/2018 1823   BILIRUBINUR NEGATIVE 04/23/2018 1823   KETONESUR NEGATIVE 04/23/2018 1823   PROTEINUR NEGATIVE 04/23/2018 1823   UROBILINOGEN 0.2 06/15/2011 1033   NITRITE NEGATIVE 04/23/2018 1823   LEUKOCYTESUR MODERATE (A) 04/23/2018 1823   Sepsis Labs Invalid input(s): PROCALCITONIN,  WBC,  LACTICIDVEN Microbiology Recent Results (from the past 240 hour(s))  MRSA PCR Screening     Status: None   Collection Time: 07/13/18  5:45 AM  Result Value Ref Range Status   MRSA by PCR NEGATIVE NEGATIVE Final    Comment:        The GeneXpert MRSA Assay (FDA approved for NASAL specimens only), is one component of a comprehensive MRSA colonization surveillance program. It is not intended to diagnose MRSA infection nor to guide or monitor treatment for MRSA  infections. Performed at [facility], [address], [place], [place] [address]      Time coordinating discharge: 40 minutes  SIGNED:   Marinda Elk, MD  [facility] 07/14/2018, 7:46 AM Pager   If 7PM-7AM, please contact night-coverage [email or web address] Password [email or web address]

## 2018-07-14 NOTE — Progress Notes (Signed)
[facility]5 -- [facility] GIP RN visit  This is a related and covered GIP admission of 07/13/2018 with [facility] diagnosis of Alzheimer's disease per Dr. Jamie Brookes. Patient has OOF DNR. [facility] activated EMS after patient experienced an unwitnessed fall in her room.   Patient was admitted with a left mid and distal femur fracture.  Day 2 of [facility] GIP.  Visited with patient and daughter, Annice Pih at bedside. Pt dozing but awakens looking for daughter. She is not oriented to place or time. Ms. Karis  awakened one time stating she is so nervous, "see how nervous I am?". At this time she does not appear to be in any pain, and daughter Annice Pih reports she feels her pain is better under control now that she is getting medicine more regularly. I spoke with Dr. David Stall about adding Ativan and scheduling Morphine. He is updating orders.  Pt has a Purewick catheter draining clear tea colored urine. No continuous medications administered. Pt has received Morphine 15 mg X 3 doses in last 24 hours and Ativan 0.5 mg X 1.  Goals of Care and Discharge Planning: Focus is on comfort care. Lengthy discussion about SNF placement vs taking patient home with hospice. We also discussed [facility] briefly. Spoke with Mardella Layman, LCSW who will be seeing patient and daughter today to further explore options. Should patient need ambulance transport at time of discharge, please call [facility] at [phone] as [facility] contracts with [facility] for our patients.  Communication with [facility] as above. Communication with [facility] per clinical note.  [facility] will continue to see patient while in the hospital. Please call with any hospice related questions or concerns.  Thank you, Haynes Bast, RN, BSN [facility] Liaison [phone]  [facility] Liaisons are on AMION.

## 2018-07-14 NOTE — Clinical Social Work Note (Signed)
Clinical Social Work Assessment  Patient Details  Name: Tami Harris MRN: [id number] Date of Birth: 1923-11-03  Date of referral:  07/13/18               Reason for consult:  Facility Placement                Permission sought to share information with:  Facility Sport and exercise psychologist Permission granted to share information::  Yes, Verbal Permission Granted  Name::     [facility]  Agency::  SNF  Relationship::  Daughter  Contact Information:  [phone]  Housing/Transportation Living arrangements for the past 2 months:  [place] of Information:  Adult Children Patient Interpreter Needed:  None Criminal Activity/Legal Involvement Pertinent to Current Situation/Hospitalization:  No - Comment as needed Significant Relationships:  Adult Children, Other Family Members Lives with:  Facility Resident Do you feel safe going back to the place where you live?  No Need for family participation in patient care:  Yes (Comment)  Care giving concerns:  Patient has been resident of [facility] since 2008, experienced unwitnessed fall, and care has now exceeded facility's ability. She is followed by hospice and has dementia.    Social Worker assessment / plan:  CSW met with daughter, Kennyth Lose, at bedside to discuss discharge plan. Patient was sleeping during this conversation and is only oriented to self due to dementia.   Patient has lived at [facility] since 2008 and experienced an unwitnessed fall at facility that brought her to the hospital. Patient's level of care has exceeded what ALF can provide and patient's daughter, Kennyth Lose, has decided to pursue LTC placement in SNF w/ hospice continuing to follow. Kennyth Lose is an only child and feels taking patient home may be too much for her. Kennyth Lose has had positive experience with hospice and wants to continue to use their services at SNF due to their EOL care and ability to provide comfort.   Kennyth Lose expressed preference  for [facility] due to proximity to home and previous positive experience with facility. Kennyth Lose also granted permission to send out information to neighboring facilities for more options.  Daughter is aware Medicare will not cover LTC and this will be private pay. Daughter is agreeable to this.   CSW will complete FL2 and send out referral to facilities.   Employment status:  Retired Nurse, adult PT Recommendations:  No Follow Up Information / Referral to community resources:  [place]  Patient/Family's Response to care:  Daughter, Kennyth Lose, was appreciative of CSW involvement and has been satisfied with care so far. She appreciates hospice involvement and is hopeful for LTC SNF placement.  Patient/Family's Understanding of and Emotional Response to Diagnosis, Current Treatment, and Prognosis:  Daughter understands process and payment options for SNF.   Emotional Assessment Appearance:  Appears older than stated age Attitude/Demeanor/Rapport:  Unable to Assess Affect (typically observed):  Unable to Assess Orientation:  Oriented to Self Alcohol / Substance use:  Not Applicable Psych involvement (Current and /or in the community):  No (Comment)  Discharge Needs  Concerns to be addressed:  Care Coordination Readmission within the last 30 days:  No Current discharge risk:  Physical Impairment Barriers to Discharge:  Other(Awaiting SNF LTC options.)   Pricilla Holm, LCSWA 07/14/2018, 11:01 AM

## 2018-07-14 NOTE — Progress Notes (Signed)
At 1700, I was notified by the pt's Daughter that the pt had significantly declined. Per the Daughter, the pt was verbal yesterday, but the pt has been unable to speak today. While assessing the pt, the pt experienced a period of apnea, followed by gargling of what sounded like secretions building up.   David Stall, MD was paged/notified. Robb Matar gave verbal orders for Scopolamine 1.5mg  q 72 hours and morphine 2mg  q 4 hours PRN.

## 2018-07-15 NOTE — Progress Notes (Signed)
At 1535 report was given via phone to Rosey Bath, Charity fundraiser from [employer].

## 2018-07-15 NOTE — Discharge Summary (Signed)
Physician Discharge Summary  Tami Harris FAO:[id number] DOB: 1924/02/04 DOA: 07/12/2018  PCP: Angela Cox, MD  Admit date: 07/12/2018 Discharge date: 07/15/2018  Admitted From: SNF Disposition:  SNF with hospice   Recommendations for Outpatient Follow-up:  1. Residential hospice.  Home Health:No Quipment/Devices:none  Discharge Condition:Guarded CODE STATUS:DNR Diet recommendation: Heart Healthy  Brief/Interim Summary: This is a 84 year old with past medical history of Alzheimer's dementia advanced/hypertension who resides at an assisted living facility who had an unwitnessed fall according to daughter patient was recently admitted to the hospital on August for aspiration pneumonia which is recurrent and UTI and since then she has been bedridden, and x-ray of the left hip was done that showed a left mid and distal femur fracture.   Discharge Diagnoses:  Principal Problem:   Fracture, femur closed, shaft (HCC) Active Problems:   Dementia (HCC)   Hypertension Left femur closed fracture: The daughter who is healthcare power of attorney did not want an aggressive intervention, she decided towards comfort measure with pain relief. The patient at baseline is severely demented as has recurrent aspiration. She was placed on narcotics which she required minimal, she was also put on MiraLAX for stool softeners and she remained stable. The patient has had a significant decline in her mental status with no oral intake. I think her life expectancy is less than 2 weeks. We have consulted social worker as she qualifies for residential hospice.  Alzheimer's dementia: Continue current medications no changes were made.  Essential hypertension: Seems to be stable will discontinue lisinopril.  Normocytic anemia: Mild.   Discharge Instructions  Discharge Instructions    Diet - low sodium heart healthy   Complete by:  As directed    Increase activity slowly   Complete by:   As directed      Allergies as of 07/15/2018      Reactions   Other    SKIN MAY TEAR   Sulfa Antibiotics Other (See Comments)   Reaction:  Unknown  Unable to state   Tape Other (See Comments)   Reaction:  Tears pts skin       Medication List    STOP taking these medications   acetaminophen 325 MG tablet Commonly known as:  TYLENOL   acetic acid 0.25 % irrigation   Cranberry 450 MG Tabs   eucerin cream   lisinopril 5 MG tablet Commonly known as:  PRINIVIL,ZESTRIL   loperamide 2 MG capsule Commonly known as:  IMODIUM     TAKE these medications   bisacodyl 10 MG suppository Commonly known as:  DULCOLAX Place 1 suppository (10 mg total) rectally daily as needed for moderate constipation.   donepezil 10 MG tablet Commonly known as:  ARICEPT Take 10 mg by mouth at bedtime.   LORazepam 0.5 MG tablet Commonly known as:  ATIVAN Take 1 tablet (0.5 mg total) by mouth every 4 (four) hours as needed for anxiety.   methocarbamol 500 MG tablet Commonly known as:  ROBAXIN Take 1 tablet (500 mg total) by mouth every 6 (six) hours as needed for muscle spasms.   morphine 15 MG tablet Commonly known as:  MSIR Take 1 tablet (15 mg total) by mouth every 4 (four) hours as needed for severe pain.   pantoprazole 40 MG tablet Commonly known as:  PROTONIX Take 40 mg by mouth 2 (two) times daily.   polyethylene glycol packet Commonly known as:  MIRALAX / GLYCOLAX Take 17 g by mouth daily.   sucralfate 1 g  tablet Commonly known as:  CARAFATE Take 1 tablet (1 g total) by mouth 4 (four) times daily -  before meals and at bedtime.       Allergies  Allergen Reactions  . Other     SKIN MAY TEAR  . Sulfa Antibiotics Other (See Comments)    Reaction:  Unknown  Unable to state  . Tape Other (See Comments)    Reaction:  Tears pts skin     Consultations:  None   Procedures/Studies: Dg Femur 1 View Left  Result Date: 07/12/2018 CLINICAL DATA:  Recent fall with left  leg pain and deformity, initial encounter EXAM: LEFT FEMUR 1 VIEW COMPARISON:  None. FINDINGS: Comminuted left femoral fracture is noted with impaction at the fracture site. IMPRESSION: Comminuted mid to distal left femoral fracture Electronically Signed   By: Alcide Clever M.D.   On: 07/12/2018 15:54    Subjective: No compalins, has required minimal narcotics  Discharge Exam: Vitals:   07/14/18 2245 07/15/18 0632  BP: 132/66 (!) 136/59  Pulse: 90 87  Resp: 15 15  Temp:    SpO2: 93% 96%   Vitals:   07/14/18 0503 07/14/18 1432 07/14/18 2245 07/15/18 0632  BP: (!) 120/51 131/66 132/66 (!) 136/59  Pulse: 77 82 90 87  Resp:  14 15 15   Temp: 98.3 F (36.8 C) 98.2 F (36.8 C)    TempSrc: Oral Oral    SpO2: 92% 94% 93% 96%  Weight:      Height:        General: Pt is alert, awake, not in acute distress Cardiovascular: RRR, S1/S2 +, no rubs, no gallops Respiratory: CTA bilaterally, no wheezing, no rhonchi Abdominal: Soft, NT, ND, bowel sounds + Extremities: no edema, no cyanosis    The results of significant diagnostics from this hospitalization (including imaging, microbiology, ancillary and laboratory) are listed below for reference.     Microbiology: Recent Results (from the past 240 hour(s))  MRSA PCR Screening     Status: None   Collection Time: 07/13/18  5:45 AM  Result Value Ref Range Status   MRSA by PCR NEGATIVE NEGATIVE Final    Comment:        The GeneXpert MRSA Assay (FDA approved for NASAL specimens only), is one component of a comprehensive MRSA colonization surveillance program. It is not intended to diagnose MRSA infection nor to guide or monitor treatment for MRSA infections. Performed at [facility], [address], [place], [place] [address]      Labs: BNP (last 3 results) No results for input(s): BNP in the last 8760 hours. Basic Metabolic Panel: Recent Labs  Lab 07/12/18 2310  CREATININE 0.62   Liver Function  Tests: No results for input(s): AST, ALT, ALKPHOS, BILITOT, PROT, ALBUMIN in the last 168 hours. No results for input(s): LIPASE, AMYLASE in the last 168 hours. No results for input(s): AMMONIA in the last 168 hours. CBC: Recent Labs  Lab 07/12/18 2310  WBC 10.1  HGB 11.9*  HCT 36.5  MCV 94.1  PLT 262   Cardiac Enzymes: No results for input(s): CKTOTAL, CKMB, CKMBINDEX, TROPONINI in the last 168 hours. BNP: Invalid input(s): POCBNP CBG: No results for input(s): GLUCAP in the last 168 hours. D-Dimer No results for input(s): DDIMER in the last 72 hours. Hgb A1c No results for input(s): HGBA1C in the last 72 hours. Lipid Profile No results for input(s): CHOL, HDL, LDLCALC, TRIG, CHOLHDL, LDLDIRECT in the last 72 hours. Thyroid function studies No  results for input(s): TSH, T4TOTAL, T3FREE, THYROIDAB in the last 72 hours.  Invalid input(s): FREET3 Anemia work up No results for input(s): VITAMINB12, FOLATE, FERRITIN, TIBC, IRON, RETICCTPCT in the last 72 hours. Urinalysis    Component Value Date/Time   COLORURINE AMBER (A) 04/23/2018 1823   APPEARANCEUR HAZY (A) 04/23/2018 1823   LABSPEC 1.026 04/23/2018 1823   PHURINE 5.0 04/23/2018 1823   GLUCOSEU NEGATIVE 04/23/2018 1823   HGBUR NEGATIVE 04/23/2018 1823   BILIRUBINUR NEGATIVE 04/23/2018 1823   KETONESUR NEGATIVE 04/23/2018 1823   PROTEINUR NEGATIVE 04/23/2018 1823   UROBILINOGEN 0.2 06/15/2011 1033   NITRITE NEGATIVE 04/23/2018 1823   LEUKOCYTESUR MODERATE (A) 04/23/2018 1823   Sepsis Labs Invalid input(s): PROCALCITONIN,  WBC,  LACTICIDVEN Microbiology Recent Results (from the past 240 hour(s))  MRSA PCR Screening     Status: None   Collection Time: 07/13/18  5:45 AM  Result Value Ref Range Status   MRSA by PCR NEGATIVE NEGATIVE Final    Comment:        The GeneXpert MRSA Assay (FDA approved for NASAL specimens only), is one component of a comprehensive MRSA colonization surveillance program. It is  not intended to diagnose MRSA infection nor to guide or monitor treatment for MRSA infections. Performed at [facility], [address] [place], [place] [address]      Time coordinating discharge: 40 minutes  SIGNED:   Marinda Elk, MD  [facility] 07/15/2018, 7:56 AM Pager   If 7PM-7AM, please contact night-coverage [email or web address] Password [email or web address]

## 2018-07-15 NOTE — Plan of Care (Signed)
Patient refusing almost everything by mouth with the exception of small straws of liquid.

## 2018-07-15 NOTE — Progress Notes (Addendum)
Patient discharging to [facility]. CSW faxed appropriate documents and confirmed bed. Family aware and agreeable to plan. [facility] will be called for transport.  RN call report: [phone]  No more CSW needs. Signing off.   Enid Cutter, MSW, LCSWA Clinical Social Work [phone]

## 2018-07-15 NOTE — Progress Notes (Addendum)
CSW received consult for residential hospice placement. Confirmed with RN and made referral to hospice liaison, Haynes Bast, for [facility].   CSW will continue to follow for discharge coordination.   Enid Cutter, MSW, LCSWA Clinical Social Work [phone]

## 2018-07-15 NOTE — Progress Notes (Addendum)
1345--[facility] RN Visit at [phone]  Received request from [place], [place] for family interest in [facility] with request to transfer today. Chart reviewed and eligibility confirmed. Met with daughter Kennyth Lose to confirm interest and explain services. Family agreeable to transfer today. Mendel Ryder, LCSW aware. Paper work completed. Dr. Orpah Melter to assume care per family request.   Please fax discharge summary to [phone].  RN please call report to [phone].  Please arrange transportation for patient to arrive as early in day as possible.  Thank you, Margaretmary Eddy, RN, [facility] Liaison [phone]  [place] are on AMION.

## 2018-08-19 DEATH — deceased
